# Patient Record
Sex: Female | Born: 1955
Health system: Southern US, Community
[De-identification: ages and names within clinical notes are randomized; demographics above are authoritative.]

## PROBLEM LIST (undated history)

## (undated) DIAGNOSIS — R600 Localized edema: Secondary | ICD-10-CM

## (undated) DIAGNOSIS — E039 Hypothyroidism, unspecified: Secondary | ICD-10-CM

## (undated) DIAGNOSIS — M1990 Unspecified osteoarthritis, unspecified site: Secondary | ICD-10-CM

## (undated) DIAGNOSIS — I313 Pericardial effusion (noninflammatory): Secondary | ICD-10-CM

## (undated) DIAGNOSIS — K219 Gastro-esophageal reflux disease without esophagitis: Secondary | ICD-10-CM

## (undated) DIAGNOSIS — T8859XA Other complications of anesthesia, initial encounter: Secondary | ICD-10-CM

## (undated) DIAGNOSIS — Z808 Family history of malignant neoplasm of other organs or systems: Secondary | ICD-10-CM

## (undated) DIAGNOSIS — T7840XA Allergy, unspecified, initial encounter: Secondary | ICD-10-CM

## (undated) DIAGNOSIS — R609 Edema, unspecified: Secondary | ICD-10-CM

## (undated) DIAGNOSIS — E669 Obesity, unspecified: Secondary | ICD-10-CM

## (undated) DIAGNOSIS — E785 Hyperlipidemia, unspecified: Secondary | ICD-10-CM

## (undated) DIAGNOSIS — Z8049 Family history of malignant neoplasm of other genital organs: Secondary | ICD-10-CM

## (undated) DIAGNOSIS — I341 Nonrheumatic mitral (valve) prolapse: Secondary | ICD-10-CM

## (undated) DIAGNOSIS — I10 Essential (primary) hypertension: Secondary | ICD-10-CM

## (undated) DIAGNOSIS — N6310 Unspecified lump in the right breast, unspecified quadrant: Secondary | ICD-10-CM

## (undated) DIAGNOSIS — I319 Disease of pericardium, unspecified: Secondary | ICD-10-CM

## (undated) DIAGNOSIS — Z803 Family history of malignant neoplasm of breast: Secondary | ICD-10-CM

## (undated) DIAGNOSIS — I3139 Other pericardial effusion (noninflammatory): Secondary | ICD-10-CM

## (undated) DIAGNOSIS — T4145XA Adverse effect of unspecified anesthetic, initial encounter: Secondary | ICD-10-CM

## (undated) HISTORY — DX: Gastro-esophageal reflux disease without esophagitis: K21.9

## (undated) HISTORY — DX: Family history of malignant neoplasm of other organs or systems: Z80.8

## (undated) HISTORY — DX: Other pericardial effusion (noninflammatory): I31.39

## (undated) HISTORY — PX: FRACTURE SURGERY: SHX138

## (undated) HISTORY — DX: Allergy, unspecified, initial encounter: T78.40XA

## (undated) HISTORY — PX: WRIST FRACTURE SURGERY: SHX121

## (undated) HISTORY — PX: TUBAL LIGATION: SHX77

## (undated) HISTORY — DX: Obesity, unspecified: E66.9

## (undated) HISTORY — PX: KNEE ARTHROSCOPY: SHX127

## (undated) HISTORY — DX: Family history of malignant neoplasm of breast: Z80.3

## (undated) HISTORY — DX: Family history of malignant neoplasm of other genital organs: Z80.49

## (undated) HISTORY — DX: Disease of pericardium, unspecified: I31.9

## (undated) HISTORY — DX: Pericardial effusion (noninflammatory): I31.3

## (undated) HISTORY — PX: BREAST SURGERY: SHX581

---
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
## 1992-03-23 HISTORY — PX: APPENDECTOMY: SHX54

## 1997-03-23 HISTORY — PX: BREAST BIOPSY: SHX20

## 1998-05-31 ENCOUNTER — Encounter: Payer: Self-pay | Admitting: Emergency Medicine

## 1998-05-31 ENCOUNTER — Emergency Department (HOSPITAL_COMMUNITY): Admission: EM | Admit: 1998-05-31 | Discharge: 1998-05-31 | Payer: Self-pay | Admitting: Emergency Medicine

## 1999-04-01 ENCOUNTER — Encounter: Payer: Self-pay | Admitting: Emergency Medicine

## 1999-04-01 ENCOUNTER — Encounter (INDEPENDENT_AMBULATORY_CARE_PROVIDER_SITE_OTHER): Payer: Self-pay | Admitting: *Deleted

## 1999-04-02 ENCOUNTER — Inpatient Hospital Stay (HOSPITAL_COMMUNITY): Admission: EM | Admit: 1999-04-02 | Discharge: 1999-04-02 | Payer: Self-pay | Admitting: Emergency Medicine

## 1999-06-24 ENCOUNTER — Ambulatory Visit (HOSPITAL_COMMUNITY): Admission: RE | Admit: 1999-06-24 | Discharge: 1999-06-24 | Payer: Self-pay | Admitting: *Deleted

## 1999-06-24 ENCOUNTER — Encounter: Payer: Self-pay | Admitting: Internal Medicine

## 2000-01-14 ENCOUNTER — Ambulatory Visit (HOSPITAL_COMMUNITY): Admission: RE | Admit: 2000-01-14 | Discharge: 2000-01-14 | Payer: Self-pay | Admitting: Internal Medicine

## 2000-01-14 ENCOUNTER — Encounter: Payer: Self-pay | Admitting: Internal Medicine

## 2000-07-06 ENCOUNTER — Encounter: Payer: Self-pay | Admitting: General Practice

## 2000-07-06 ENCOUNTER — Ambulatory Visit (HOSPITAL_COMMUNITY): Admission: RE | Admit: 2000-07-06 | Discharge: 2000-07-06 | Payer: Self-pay | Admitting: *Deleted

## 2001-03-28 ENCOUNTER — Emergency Department (HOSPITAL_COMMUNITY): Admission: EM | Admit: 2001-03-28 | Discharge: 2001-03-28 | Payer: Self-pay | Admitting: Emergency Medicine

## 2001-04-26 ENCOUNTER — Emergency Department (HOSPITAL_COMMUNITY): Admission: EM | Admit: 2001-04-26 | Discharge: 2001-04-26 | Payer: Self-pay | Admitting: Emergency Medicine

## 2001-04-26 ENCOUNTER — Encounter: Payer: Self-pay | Admitting: Emergency Medicine

## 2001-07-26 ENCOUNTER — Ambulatory Visit (HOSPITAL_COMMUNITY): Admission: RE | Admit: 2001-07-26 | Discharge: 2001-07-26 | Payer: Self-pay | Admitting: Internal Medicine

## 2001-07-26 ENCOUNTER — Encounter: Payer: Self-pay | Admitting: Internal Medicine

## 2002-08-08 ENCOUNTER — Ambulatory Visit (HOSPITAL_COMMUNITY): Admission: RE | Admit: 2002-08-08 | Discharge: 2002-08-08 | Payer: Self-pay | Admitting: Internal Medicine

## 2002-08-08 ENCOUNTER — Encounter: Payer: Self-pay | Admitting: Internal Medicine

## 2003-04-23 ENCOUNTER — Ambulatory Visit (HOSPITAL_COMMUNITY): Admission: RE | Admit: 2003-04-23 | Discharge: 2003-04-23 | Payer: Self-pay | Admitting: Internal Medicine

## 2003-08-22 ENCOUNTER — Ambulatory Visit (HOSPITAL_COMMUNITY): Admission: RE | Admit: 2003-08-22 | Discharge: 2003-08-22 | Payer: Self-pay | Admitting: Internal Medicine

## 2003-09-18 ENCOUNTER — Ambulatory Visit (HOSPITAL_COMMUNITY): Admission: RE | Admit: 2003-09-18 | Discharge: 2003-09-18 | Payer: Self-pay | Admitting: Internal Medicine

## 2005-02-16 ENCOUNTER — Ambulatory Visit (HOSPITAL_COMMUNITY): Admission: RE | Admit: 2005-02-16 | Discharge: 2005-02-16 | Payer: Self-pay | Admitting: Obstetrics and Gynecology

## 2006-04-05 ENCOUNTER — Ambulatory Visit (HOSPITAL_COMMUNITY): Admission: RE | Admit: 2006-04-05 | Discharge: 2006-04-05 | Payer: Self-pay | Admitting: Internal Medicine

## 2006-09-01 ENCOUNTER — Ambulatory Visit (HOSPITAL_COMMUNITY): Admission: RE | Admit: 2006-09-01 | Discharge: 2006-09-01 | Payer: Self-pay | Admitting: Internal Medicine

## 2008-02-02 ENCOUNTER — Ambulatory Visit (HOSPITAL_COMMUNITY): Admission: RE | Admit: 2008-02-02 | Discharge: 2008-02-02 | Payer: Self-pay | Admitting: Internal Medicine

## 2008-04-27 ENCOUNTER — Ambulatory Visit (HOSPITAL_COMMUNITY): Admission: RE | Admit: 2008-04-27 | Discharge: 2008-04-27 | Payer: Self-pay | Admitting: Obstetrics and Gynecology

## 2009-03-23 HISTORY — PX: MIDDLE EAR SURGERY: SHX713

## 2009-04-29 ENCOUNTER — Ambulatory Visit (HOSPITAL_COMMUNITY): Admission: RE | Admit: 2009-04-29 | Discharge: 2009-04-29 | Payer: Self-pay | Admitting: Obstetrics and Gynecology

## 2009-08-20 ENCOUNTER — Ambulatory Visit (HOSPITAL_COMMUNITY): Admission: RE | Admit: 2009-08-20 | Discharge: 2009-08-20 | Payer: Self-pay | Admitting: Internal Medicine

## 2010-01-24 ENCOUNTER — Ambulatory Visit: Payer: Self-pay | Admitting: Diagnostic Radiology

## 2010-01-24 ENCOUNTER — Emergency Department (HOSPITAL_BASED_OUTPATIENT_CLINIC_OR_DEPARTMENT_OTHER): Admission: EM | Admit: 2010-01-24 | Discharge: 2010-01-25 | Payer: Self-pay | Admitting: Emergency Medicine

## 2010-01-31 ENCOUNTER — Ambulatory Visit (HOSPITAL_BASED_OUTPATIENT_CLINIC_OR_DEPARTMENT_OTHER): Admission: RE | Admit: 2010-01-31 | Discharge: 2010-01-31 | Payer: Self-pay | Admitting: Plastic Surgery

## 2010-03-09 ENCOUNTER — Emergency Department (HOSPITAL_BASED_OUTPATIENT_CLINIC_OR_DEPARTMENT_OTHER)
Admission: EM | Admit: 2010-03-09 | Discharge: 2010-03-09 | Payer: Self-pay | Source: Home / Self Care | Admitting: Emergency Medicine

## 2010-04-12 ENCOUNTER — Encounter: Payer: Self-pay | Admitting: Internal Medicine

## 2010-04-13 ENCOUNTER — Encounter: Payer: Self-pay | Admitting: Internal Medicine

## 2010-04-13 ENCOUNTER — Encounter: Payer: Self-pay | Admitting: Obstetrics and Gynecology

## 2010-06-04 LAB — BASIC METABOLIC PANEL
CO2: 29 mEq/L (ref 19–32)
Calcium: 9.2 mg/dL (ref 8.4–10.5)
Glucose, Bld: 82 mg/dL (ref 70–99)
Sodium: 137 mEq/L (ref 135–145)

## 2010-08-08 NOTE — Op Note (Signed)
[place]. [facility]  Patient:    Stephanie Dudley, Stephanie Dudley                    MRN: [id number] Proc. Date: 04/02/99 Attending:  Sharlet Salina T. Hoxworth, M.D.                           Operative Report  PREOPERATIVE DIAGNOSIS:  Appendicitis.  POSTOPERATIVE DIAGNOSIS:  Ruptured right ovarian cyst.  OPERATION PERFORMED:  SURGEON:  Sharlet Salina T. Hoxworth, M.D.  ANESTHESIA:  General.  BRIEF HISTORY:  Stephanie Dudley is a 55 year old black female with a 36-hour history of progressively worsening right lower quadrant abdominal pain.  She has had some nausea and lack of appetite.  White count is moderately elevated with a left shift. She has point tenderness in the right lower quadrant.  She has had a gyn evaluation that included ultrasound that was reported negative.  She was felt to have likely appendicitis and laparoscopic appendectomy has been recommended and accepted. he nature of the procedure, its indications and risks of bleeding, infection, negative appendectomy were discussed and understood.  She is now brought to the operating room for this procedure.  DESCRIPTION OF PROCEDURE:  The patient was brought to the operating room and placed in supine position on the operating table and general endotracheal anesthesia induced.  Broad spectrum antibiotics had been given intravenously.  PAS were in  place.  The abdomen was sterilely prepped and draped.  Local anesthesia was used to infiltrate the trocar sites.  A 1 cm incision was made at the umbilicus and dissection was carried down to the midline fascia.  This was sharply incised for 1 cm and the peritoneum was entered under direct vision.  Through a mattress suture of 0 Vicryl, the Hasson trocar was placed and pneumoperitoneum established. The laparoscopy revealed a moderate amount of clear, greenish-yellow tinted fluid in the right gutter and pelvis.  A 5 mm trocar was placed in the right  upper quadrant. The appendix was exposed which grossly appeared normal.  The right ovary was exposed and was about a 3 cm right ovarian cyst that appeared to have an area of leakage.  This cyst was aspirated with an aspiration needle with complete resolution and the fluid appeared identical to the peritoneal fluid.  The left ovary was normal.  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  I then proceeded with appendectomy.  A 12 mm trocar was placed in the left lower quadrant. The appendix was elevated and the mesoappendix dissected away from the appendix at its base and divided with a firing of the Endo GIA vascular stapler.  One further area was divided after clipping.  The appendix was then divided at its base with a second firing of the Endo GIA 3.5 mm stapler.  The mesoappendix and appendiceal  stump were inspected and there was no evidence of bleeding and the appendiceal stump appeared intact.  The appendix was withdrawn into the 12 mm trocar and removed.  All CO2 was evacuated from the peritoneal cavity and trocars were removed under direct vision.  The pursestring suture was secured to the umbilicus.  The  skin incisions were closed with interrupted subcuticular 4-0 Vicryl and Steri-Strips.  Sponge, needle and instrument counts were correct.  A dry sterile dressing was applied.  The patient was taken to the recovery room in good condition. DD:  04/02/99 TD:  04/02/99 Job: [id number]

## 2010-11-22 ENCOUNTER — Emergency Department (HOSPITAL_BASED_OUTPATIENT_CLINIC_OR_DEPARTMENT_OTHER)
Admission: EM | Admit: 2010-11-22 | Discharge: 2010-11-22 | Disposition: A | Discharge status: Home / Self Care | Payer: Federal, State, Local not specified - PPO | Attending: Emergency Medicine | Admitting: Emergency Medicine

## 2010-11-22 ENCOUNTER — Encounter: Payer: Self-pay | Admitting: *Deleted

## 2010-11-22 DIAGNOSIS — E079 Disorder of thyroid, unspecified: Secondary | ICD-10-CM | POA: Insufficient documentation

## 2010-11-22 DIAGNOSIS — H9209 Otalgia, unspecified ear: Secondary | ICD-10-CM | POA: Insufficient documentation

## 2010-11-22 DIAGNOSIS — I10 Essential (primary) hypertension: Secondary | ICD-10-CM | POA: Insufficient documentation

## 2010-11-22 DIAGNOSIS — J309 Allergic rhinitis, unspecified: Secondary | ICD-10-CM

## 2010-11-22 HISTORY — DX: Essential (primary) hypertension: I10

## 2010-11-22 HISTORY — DX: Nonrheumatic mitral (valve) prolapse: I34.1

## 2010-11-22 MED ORDER — CETIRIZINE-PSEUDOEPHEDRINE ER 5-120 MG PO TB12: 1.0000 | ORAL_TABLET | Freq: Two times a day (BID) | ORAL | Status: DC

## 2010-11-22 NOTE — ED Notes (Signed)
Pt states she has had left ear pain and sinus problems since last Sat.

## 2010-11-22 NOTE — ED Provider Notes (Signed)
Medical screening examination/treatment/procedure(s) were performed by non-physician practitioner and as supervising physician I was immediately available for consultation/collaboration.   Douglas Delo, MD 11/22/10 2016 

## 2010-11-22 NOTE — ED Provider Notes (Signed)
History     CSN: [id number] Arrival date & time: 11/22/2010  3:35 PM  Chief Complaint  Patient presents with  . Otalgia   Patient is a 55 y.o. female presenting with ear pain. The history is provided by the patient. No language interpreter was used.  Otalgia This is a new problem. The current episode started more than 1 week ago. There is pain in the left ear. The problem occurs constantly. The problem has been gradually worsening. There has been no fever. The pain is moderate. Associated symptoms include rhinorrhea and sore throat. Pertinent negatives include no ear discharge, no headaches, no cough and no rash. Her past medical history is significant for tympanostomy tube. Her past medical history does not include chronic ear infection.    Past Medical History  Diagnosis Date  . Thyroid disease   . Hypertension   . Mitral valve prolapse     Past Surgical History  Procedure Date  . Appendectomy   . Breast surgery   . Middle ear surgery     No family history on file.  History  Substance Use Topics  . Smoking status: Never Smoker   . Smokeless tobacco: Not on file  . Alcohol Use: Yes    OB History    Grav Para Term Preterm Abortions TAB SAB Ect Mult Living                  Review of Systems  Constitutional: Negative.   HENT: Positive for ear pain, sore throat and rhinorrhea. Negative for ear discharge.   Respiratory: Negative.  Negative for cough.   Cardiovascular: Negative.   Skin: Negative for rash.  Neurological: Negative.  Negative for headaches.    Physical Exam  BP 132/82  Pulse 96  Temp(Src) 98.1 F (36.7 C) (Oral)  Resp 20  Ht 5\' 5"  (1.651 m)  Wt 170 lb (77.111 kg)  BMI 28.29 kg/m2  SpO2 98%  Physical Exam  Vitals reviewed. Constitutional: She appears well-developed and well-nourished.  HENT:  Head: Normocephalic.  Right Ear: Tympanic membrane normal.  Left Ear: Tympanic membrane is bulging.  Nose: Rhinorrhea present.  Mouth/Throat:  Posterior oropharyngeal erythema present.  Eyes: Pupils are equal, round, and reactive to light.  Cardiovascular: Normal rate and regular rhythm.   Pulmonary/Chest: Effort normal and breath sounds normal.  Neurological: She is alert.    ED Course  Procedures  MDM Symptoms consistent with allergic rhinitis:will treat symptomatically      Teressa Lower, NP 11/22/10 1550  Teressa Lower, NP 11/22/10 1550

## 2011-06-12 ENCOUNTER — Emergency Department (INDEPENDENT_AMBULATORY_CARE_PROVIDER_SITE_OTHER): Payer: Federal, State, Local not specified - PPO

## 2011-06-12 ENCOUNTER — Emergency Department (HOSPITAL_BASED_OUTPATIENT_CLINIC_OR_DEPARTMENT_OTHER)
Admission: EM | Admit: 2011-06-12 | Discharge: 2011-06-12 | Disposition: A | Discharge status: Home / Self Care | Payer: Federal, State, Local not specified - PPO | Attending: Emergency Medicine | Admitting: Emergency Medicine

## 2011-06-12 ENCOUNTER — Encounter (HOSPITAL_BASED_OUTPATIENT_CLINIC_OR_DEPARTMENT_OTHER): Payer: Self-pay

## 2011-06-12 DIAGNOSIS — Z79899 Other long term (current) drug therapy: Secondary | ICD-10-CM | POA: Insufficient documentation

## 2011-06-12 DIAGNOSIS — R05 Cough: Secondary | ICD-10-CM | POA: Insufficient documentation

## 2011-06-12 DIAGNOSIS — J40 Bronchitis, not specified as acute or chronic: Secondary | ICD-10-CM | POA: Insufficient documentation

## 2011-06-12 DIAGNOSIS — R0989 Other specified symptoms and signs involving the circulatory and respiratory systems: Secondary | ICD-10-CM | POA: Insufficient documentation

## 2011-06-12 DIAGNOSIS — I10 Essential (primary) hypertension: Secondary | ICD-10-CM | POA: Insufficient documentation

## 2011-06-12 DIAGNOSIS — J3489 Other specified disorders of nose and nasal sinuses: Secondary | ICD-10-CM | POA: Insufficient documentation

## 2011-06-12 DIAGNOSIS — R059 Cough, unspecified: Secondary | ICD-10-CM

## 2011-06-12 DIAGNOSIS — E079 Disorder of thyroid, unspecified: Secondary | ICD-10-CM | POA: Insufficient documentation

## 2011-06-12 MED ORDER — AZITHROMYCIN 250 MG PO TABS: 250.0000 mg | ORAL_TABLET | Freq: Every day | ORAL | Status: AC

## 2011-06-12 NOTE — Discharge Instructions (Signed)
Bronchitis Bronchitis is a problem of the air tubes leading to your lungs. This problem makes it hard for air to get in and out of the lungs. You may cough a lot because your air tubes are narrow. Going without care can cause lasting (chronic) bronchitis. HOME CARE   Drink enough fluids to keep your pee (urine) clear or pale yellow.   Use a cool mist humidifier.   Quit smoking if you smoke. If you keep smoking, the bronchitis might not get better.   Only take medicine as told by your doctor.  GET HELP RIGHT AWAY IF:   Coughing keeps you awake.   You start to wheeze.   You become more sick or weak.   You have a hard time breathing or get short of breath.   You cough up blood.   Coughing lasts more than 2 weeks.   You have a fever.   Your baby is older than 3 months with a rectal temperature of 102 F (38.9 C) or higher.   Your baby is 3 months old or younger with a rectal temperature of 100.4 F (38 C) or higher.  MAKE SURE YOU:  Understand these instructions.   Will watch your condition.   Will get help right away if you are not doing well or get worse.  Document Released: 08/26/2007 Document Revised: 02/26/2011 Document Reviewed: 02/08/2009 [employer] Patient Information 2012 [employer]. 

## 2011-06-12 NOTE — ED Notes (Signed)
Pt c/o sinus pain and productive cough with yellow sputum for past 2 weeks.  Pt states she has felt febrile at times.

## 2011-06-12 NOTE — ED Provider Notes (Signed)
History     CSN: [id number]  Arrival date & time 06/12/11  1524   First MD Initiated Contact with Patient 06/12/11 1537      Chief Complaint  Patient presents with  . Sinusitis  . Cough    (Consider location/radiation/quality/duration/timing/severity/associated sxs/prior treatment) HPI Comments: Pt states that she has tried otc medication without relief:pt states that she is concerned because she has continued to have a bad cough  Patient is a 56 y.o. female presenting with URI. The history is provided by the patient. No language interpreter was used.  URI The primary symptoms include cough. Primary symptoms do not include fever, sore throat, abdominal pain, nausea or vomiting. The current episode started more than 1 week ago. The problem has not changed since onset. Symptoms associated with the illness include congestion.    Past Medical History  Diagnosis Date  . Thyroid disease   . Hypertension   . Mitral valve prolapse     Past Surgical History  Procedure Date  . Appendectomy   . Breast surgery   . Middle ear surgery     No family history on file.  History  Substance Use Topics  . Smoking status: Never Smoker   . Smokeless tobacco: Not on file  . Alcohol Use: Yes    OB History    Grav Para Term Preterm Abortions TAB SAB Ect Mult Living                  Review of Systems  Constitutional: Negative for fever.  HENT: Positive for congestion. Negative for sore throat.   Eyes: Negative.   Respiratory: Positive for cough.   Cardiovascular: Negative.   Gastrointestinal: Negative for nausea, vomiting and abdominal pain.  Musculoskeletal: Negative.   Skin: Negative.   Neurological: Negative.     Allergies  Sulfa antibiotics  Home Medications   Current Outpatient Rx  Name Route Sig Dispense Refill  . CETIRIZINE-PSEUDOEPHEDRINE ER 5-120 MG PO TB12 Oral Take 1 tablet by mouth 2 (two) times daily. 20 tablet 0  . ESCITALOPRAM OXALATE 10 MG PO TABS Oral  Take 20 mg by mouth daily.      . FUROSEMIDE 20 MG PO TABS Oral Take 20 mg by mouth daily.      Marland Kitchen LEVOTHYROXINE SODIUM 75 MCG PO TABS Oral Take 75 mcg by mouth daily.      Marland Kitchen PRAVASTATIN SODIUM 20 MG PO TABS Oral Take 20 mg by mouth daily.      Marland Kitchen SPIRONOLACTONE-HCTZ 25-25 MG PO TABS Oral Take 1 tablet by mouth daily.      Marland Kitchen VALSARTAN 40 MG PO TABS Oral Take 40 mg by mouth daily.        BP 124/79  Pulse 77  Temp(Src) 98.3 F (36.8 C) (Oral)  Resp 16  Ht 5\' 6"  (1.676 m)  Wt 170 lb (77.111 kg)  BMI 27.44 kg/m2  SpO2 99%  Physical Exam  Nursing note and vitals reviewed. Constitutional: She is oriented to person, place, and time. She appears well-developed and well-nourished.  HENT:  Head: Normocephalic and atraumatic.  Right Ear: External ear normal.  Left Ear: External ear normal.  Nose: Rhinorrhea present.  Neck: Normal range of motion. Neck supple.  Cardiovascular: Normal rate and regular rhythm.   Pulmonary/Chest: Effort normal and breath sounds normal.  Musculoskeletal: Normal range of motion.  Neurological: She is alert and oriented to person, place, and time.    ED Course  Procedures (including critical care time)  Labs Reviewed - No data to display Dg Chest 2 View  06/12/2011  *RADIOLOGY REPORT*  Clinical Data: Chest congestion, cough  CHEST - 2 VIEW  Comparison: Chest x-ray of 08/20/2009  Findings: No active infiltrate or effusion is seen.  Minimal peribronchial thickening is present.  Mediastinal contours appear stable.  The heart is within upper limits of normal.  No bony abnormality is seen.  IMPRESSION: No pneumonia.  Mild peribronchial thickening.  Original Report Authenticated By: Juline Patch, M.D.     1. Bronchitis       MDM  Will treat pt with antibiotic as pt has been having symptoms for 2 weeks:no sign of pneumonia        Teressa Lower, NP 06/12/11 1629

## 2011-06-12 NOTE — ED Provider Notes (Signed)
Medical screening examination/treatment/procedure(s) were performed by non-physician practitioner and as supervising physician I was immediately available for consultation/collaboration.   Avereigh Spainhower, MD 06/12/11 2342 

## 2011-06-27 ENCOUNTER — Emergency Department (INDEPENDENT_AMBULATORY_CARE_PROVIDER_SITE_OTHER): Payer: Federal, State, Local not specified - PPO

## 2011-06-27 ENCOUNTER — Other Ambulatory Visit: Payer: Self-pay

## 2011-06-27 ENCOUNTER — Encounter (HOSPITAL_BASED_OUTPATIENT_CLINIC_OR_DEPARTMENT_OTHER): Payer: Self-pay | Admitting: Emergency Medicine

## 2011-06-27 ENCOUNTER — Emergency Department (HOSPITAL_BASED_OUTPATIENT_CLINIC_OR_DEPARTMENT_OTHER)
Admission: EM | Admit: 2011-06-27 | Discharge: 2011-06-27 | Disposition: A | Discharge status: Home / Self Care | Payer: Federal, State, Local not specified - PPO | Attending: Emergency Medicine | Admitting: Emergency Medicine

## 2011-06-27 DIAGNOSIS — I10 Essential (primary) hypertension: Secondary | ICD-10-CM | POA: Insufficient documentation

## 2011-06-27 DIAGNOSIS — R55 Syncope and collapse: Secondary | ICD-10-CM

## 2011-06-27 DIAGNOSIS — E079 Disorder of thyroid, unspecified: Secondary | ICD-10-CM | POA: Insufficient documentation

## 2011-06-27 LAB — CBC
HCT: 37.4 % (ref 36.0–46.0)
MCH: 30.8 pg (ref 26.0–34.0)
MCHC: 33.2 g/dL (ref 30.0–36.0)
MCV: 93 fL (ref 78.0–100.0)
Platelets: 293 10*3/uL (ref 150–400)
RDW: 12.7 % (ref 11.5–15.5)
WBC: 10.3 10*3/uL (ref 4.0–10.5)

## 2011-06-27 LAB — DIFFERENTIAL
Basophils Absolute: 0 10*3/uL (ref 0.0–0.1)
Eosinophils Absolute: 0.1 10*3/uL (ref 0.0–0.7)
Eosinophils Relative: 1 % (ref 0–5)
Lymphocytes Relative: 49 % — ABNORMAL HIGH (ref 12–46)
Monocytes Absolute: 0.9 10*3/uL (ref 0.1–1.0)

## 2011-06-27 LAB — COMPREHENSIVE METABOLIC PANEL
ALT: 11 U/L (ref 0–35)
AST: 21 U/L (ref 0–37)
CO2: 26 mEq/L (ref 19–32)
Calcium: 9.6 mg/dL (ref 8.4–10.5)
Creatinine, Ser: 0.8 mg/dL (ref 0.50–1.10)
GFR calc Af Amer: >90 mL/min (ref >90)
GFR calc non Af Amer: 82 mL/min — ABNORMAL LOW (ref >90)
Sodium: 143 mEq/L (ref 135–145)
Total Protein: 7.5 g/dL (ref 6.0–8.3)

## 2011-06-27 MED ORDER — SODIUM CHLORIDE 0.9 % IV SOLN
1000.0000 mL | Freq: Once | INTRAVENOUS | Status: AC
  Administered 2011-06-27: 1000 mL via INTRAVENOUS

## 2011-06-27 MED ORDER — SODIUM CHLORIDE 0.9 % IV SOLN: 1000.0000 mL | INTRAVENOUS | Status: DC

## 2011-06-27 NOTE — ED Provider Notes (Signed)
History    This chart was scribed for Hilario Quarry, MD, MD by Smitty Pluck. The patient was seen in room MH10 and the patient's care was started at 4:35PM.   CSN: [id number]  Arrival date & time 06/27/11  1615   First MD Initiated Contact with Patient 06/27/11 1633      Chief Complaint  Patient presents with  . Loss of Consciousness    (Consider location/radiation/quality/duration/timing/severity/associated sxs/prior treatment) Patient is a 56 y.o. female presenting with syncope. The history is provided by the patient.  Loss of Consciousness   Stephanie Dudley is a 56 y.o. female who presents to the Emergency Department complaining of syncope onset today. Pt fell to the ground. She denies hitting head. Pt reports sitting and watching her granddaughter foot laceration repaired then getting dizzy. She reports having blurred vision currently but no other symptoms. Pt has history of thyroid disease, HTN and mitral valve prolapse, hyperlipidemia. Pt denies hx of syncope.   Past Medical History  Diagnosis Date  . Thyroid disease   . Hypertension   . Mitral valve prolapse     Past Surgical History  Procedure Date  . Appendectomy   . Breast surgery   . Middle ear surgery     No family history on file.  History  Substance Use Topics  . Smoking status: Never Smoker   . Smokeless tobacco: Not on file  . Alcohol Use: Yes    OB History    Grav Para Term Preterm Abortions TAB SAB Ect Mult Living                  Review of Systems  Cardiovascular: Positive for syncope.  All other systems reviewed and are negative.   10 Systems reviewed and all are negative for acute change except as noted in the HPI.   Allergies  Sulfa antibiotics  Home Medications   Current Outpatient Rx  Name Route Sig Dispense Refill  . CETIRIZINE-PSEUDOEPHEDRINE ER 5-120 MG PO TB12 Oral Take 1 tablet by mouth 2 (two) times daily. 20 tablet 0  . DM-GUAIFENESIN ER 30-600 MG PO TB12 Oral Take 1  tablet by mouth every 12 (twelve) hours. Patient took this medication for congestion.    Marland Kitchen ESCITALOPRAM OXALATE 10 MG PO TABS Oral Take 20 mg by mouth daily.      . FUROSEMIDE 20 MG PO TABS Oral Take 20 mg by mouth daily.      Marland Kitchen LEVOTHYROXINE SODIUM 75 MCG PO TABS Oral Take 75 mcg by mouth daily.      Marland Kitchen PRAVASTATIN SODIUM 20 MG PO TABS Oral Take 20 mg by mouth daily.      Marland Kitchen SPIRONOLACTONE-HCTZ 25-25 MG PO TABS Oral Take 1 tablet by mouth daily.      Marland Kitchen VALSARTAN 40 MG PO TABS Oral Take 40 mg by mouth daily.        BP 146/85  Pulse 74  Temp(Src) 98 F (36.7 C) (Oral)  Resp 24  SpO2 100%  Physical Exam  Nursing note and vitals reviewed. Constitutional: She is oriented to person, place, and time. She appears well-developed and well-nourished. No distress.  HENT:  Head: Normocephalic and atraumatic.  Eyes: Conjunctivae are normal. Pupils are equal, round, and reactive to light.  Neck: Normal range of motion. No tracheal deviation present.  Cardiovascular: Normal rate, regular rhythm and normal heart sounds.   Pulmonary/Chest: Effort normal. No respiratory distress.  Neurological: She is alert and oriented to person, place, and time.  Skin: Skin is warm and dry.  Psychiatric: She has a normal mood and affect. Her behavior is normal.    ED Course  Procedures (including critical care time) DIAGNOSTIC STUDIES: Oxygen Saturation is 100% on room air, normal by my interpretation.    COORDINATION OF CARE: 4:42PM EDP discusses pt ED treatment course with pt.  4:42PM EDP ordered medication: 0.9% NaCl infusion    Labs Reviewed  DIFFERENTIAL - Abnormal; Notable for the following:    Neutrophils Relative 42 (*)    Lymphocytes Relative 49 (*)    Lymphs Abs 5.0 (*)    All other components within normal limits  COMPREHENSIVE METABOLIC PANEL - Abnormal; Notable for the following:    Potassium 3.4 (*)    GFR calc non Af Amer 82 (*)    All other components within normal limits  CBC  POCT  CBG (FASTING - GLUCOSE)-MANUAL ENTRY  TROPONIN I   Dg Chest 2 View  06/27/2011  *RADIOLOGY REPORT*  Clinical Data: Syncope  CHEST - 2 VIEW  Comparison: 06/12/11  Findings: Cardiomediastinal is stable.  No acute infiltrate or pleural effusion.  No pulmonary edema.  Bony thorax is stable. Stable central mild bronchitic changes.  IMPRESSION: No active disease.  No significant change .  Original Report Authenticated By: Natasha Mead, M.D.     No diagnosis found.   Date: 06/27/2011  Rate: 73  Rhythm: normal sinus rhythm  QRS Axis: normal  Intervals: normal  ST/T Wave abnormalities: normal  Conduction Disutrbances: none  Narrative Interpretation: unremarkable      MDM  I personally performed the services described in this documentation, which was scribed in my presence. The recorded information has been reviewed and considered.   Patient with granddaughter having laceration repaired.  NO evidence of cad on exam or work up.  Episode appears to be vasovagal syncope.     Hilario Quarry, MD 06/30/11 [phone]

## 2011-06-27 NOTE — ED Notes (Signed)
Pt was with granddaughter (granddaughter Psychologist, forensic); according to PA repairing lac,  pt became diaphoretic, confused and had a syncopal episode

## 2011-06-27 NOTE — Discharge Instructions (Signed)
Syncope  Syncope (fainting) is a sudden, short loss of consciousness. People normally fall to the ground when they faint. Recovery is often fast.  HOME CARE   Do not drive or use machines. Wait until your doctor says it is safe to do so.    If you have diabetes, check your blood sugar. If it is low (below 70), you need to drink or eat something sweet. If over 300, call your doctor.    If you have a blood pressure machine at home, take your blood pressure. If the top number is below 100 or above 170, call your doctor.    Lie down until you feel normal.    Drink extra fluids (water, juice, soup).   GET HELP RIGHT AWAY IF:     You pass out (faint) when sitting or lying down. Do not drive. Call your local emergency services (911 in [place] if no one is there to help you.    There is chest pain.    You feel sick to your stomach (nauseous) or keep throwing up (vomiting).    You have very bad belly (abdominal) pain.    You feel your heartbeat is fast or not normal.    You lose feeling in some part of the body.    You cannot move your arms or legs.    You cannot talk well and get confused.    You feel weak or cannot see well.    You get sweaty and feel lightheaded.   MAKE SURE YOU:     Understand these instructions.    Will watch your condition.    Will get help right away if you are not doing well or get worse.   Document Released: 08/26/2007 Document Revised: 02/26/2011 Document Reviewed: 08/26/2007  [facility] Patient Information 2012 [place], [place].

## 2011-07-06 LAB — GLUCOSE, CAPILLARY: Glucose-Capillary: 103 mg/dL — ABNORMAL HIGH (ref 70–99)

## 2011-07-09 ENCOUNTER — Ambulatory Visit (HOSPITAL_COMMUNITY)
Admission: RE | Admit: 2011-07-09 | Discharge: 2011-07-09 | Disposition: A | Discharge status: Home / Self Care | Payer: Federal, State, Local not specified - PPO | Source: Ambulatory Visit | Attending: Internal Medicine | Admitting: Internal Medicine

## 2011-07-09 ENCOUNTER — Encounter (INDEPENDENT_AMBULATORY_CARE_PROVIDER_SITE_OTHER): Payer: Federal, State, Local not specified - PPO

## 2011-07-09 DIAGNOSIS — R55 Syncope and collapse: Secondary | ICD-10-CM | POA: Insufficient documentation

## 2011-07-09 DIAGNOSIS — I10 Essential (primary) hypertension: Secondary | ICD-10-CM | POA: Insufficient documentation

## 2011-07-09 DIAGNOSIS — R002 Palpitations: Secondary | ICD-10-CM

## 2011-07-09 DIAGNOSIS — E785 Hyperlipidemia, unspecified: Secondary | ICD-10-CM | POA: Insufficient documentation

## 2011-07-09 NOTE — Progress Notes (Signed)
  Echocardiogram 2D Echocardiogram has been performed.  Stephanie Dudley A 07/09/2011, 10:34 AM

## 2011-11-07 ENCOUNTER — Emergency Department (HOSPITAL_BASED_OUTPATIENT_CLINIC_OR_DEPARTMENT_OTHER)
Admission: EM | Admit: 2011-11-07 | Discharge: 2011-11-07 | Disposition: A | Discharge status: Home / Self Care | Payer: Federal, State, Local not specified - PPO | Attending: Emergency Medicine | Admitting: Emergency Medicine

## 2011-11-07 ENCOUNTER — Encounter (HOSPITAL_BASED_OUTPATIENT_CLINIC_OR_DEPARTMENT_OTHER): Payer: Self-pay | Admitting: *Deleted

## 2011-11-07 DIAGNOSIS — Z79899 Other long term (current) drug therapy: Secondary | ICD-10-CM | POA: Insufficient documentation

## 2011-11-07 DIAGNOSIS — I059 Rheumatic mitral valve disease, unspecified: Secondary | ICD-10-CM | POA: Insufficient documentation

## 2011-11-07 DIAGNOSIS — Z882 Allergy status to sulfonamides status: Secondary | ICD-10-CM | POA: Insufficient documentation

## 2011-11-07 DIAGNOSIS — I10 Essential (primary) hypertension: Secondary | ICD-10-CM | POA: Insufficient documentation

## 2011-11-07 DIAGNOSIS — E079 Disorder of thyroid, unspecified: Secondary | ICD-10-CM | POA: Insufficient documentation

## 2011-11-07 DIAGNOSIS — M545 Low back pain, unspecified: Secondary | ICD-10-CM

## 2011-11-07 MED ORDER — METHOCARBAMOL 500 MG PO TABS: 500.0000 mg | ORAL_TABLET | Freq: Two times a day (BID) | ORAL | Status: AC

## 2011-11-07 MED ORDER — HYDROCODONE-ACETAMINOPHEN 5-325 MG PO TABS: 2.0000 | ORAL_TABLET | ORAL | Status: AC | PRN

## 2011-11-07 NOTE — ED Provider Notes (Signed)
History     CSN: [id number]  Arrival date & time 11/07/11  1751   First MD Initiated Contact with Patient 11/07/11 1754      Chief Complaint  Patient presents with  . Back Pain    (Consider location/radiation/quality/duration/timing/severity/associated sxs/prior treatment) HPI Comments: Patient presents today with pain across her lower back.  Pain does not radiate.  Onset of pain this morning.  She states that pain is worse with ambulation and ROM of the lower back.  She states that she has been having occasional muscle spasms of her lower back and that her back feels "stiff" and "tight."  Yesterday she helped her daughter move and did a lot of heavy lifting and twisting.  She has tried taking Ibuprofen for the pain, which provides mild relief.  Patient is a 56 y.o. female presenting with back pain. The history is provided by the patient.  Back Pain  This is a new problem. The problem occurs constantly. The problem has been gradually worsening. The symptoms are aggravated by bending and twisting. Pertinent negatives include no fever, no numbness, no abdominal pain, no bowel incontinence, no perianal numbness, no bladder incontinence, no dysuria, no leg pain, no paresthesias, no paresis, no tingling and no weakness.    Past Medical History  Diagnosis Date  . Thyroid disease   . Hypertension   . Mitral valve prolapse     Past Surgical History  Procedure Date  . Appendectomy   . Breast surgery   . Middle ear surgery     History reviewed. No pertinent family history.  History  Substance Use Topics  . Smoking status: Never Smoker   . Smokeless tobacco: Not on file  . Alcohol Use: Yes    OB History    Grav Para Term Preterm Abortions TAB SAB Ect Mult Living                  Review of Systems  Constitutional: Negative for fever and chills.  Gastrointestinal: Negative for nausea, vomiting, abdominal pain and bowel incontinence.  Genitourinary: Negative for bladder  incontinence, dysuria and decreased urine volume.       No bowel or bladder incontinence No urinary retention  Musculoskeletal: Positive for back pain. Negative for gait problem.  Skin: Negative for rash.  Neurological: Negative for tingling, weakness, numbness and paresthesias.    Allergies  Sulfa antibiotics  Home Medications   Current Outpatient Rx  Name Route Sig Dispense Refill  . DM-GUAIFENESIN ER 30-600 MG PO TB12 Oral Take 1 tablet by mouth every 12 (twelve) hours. Patient took this medication for congestion.    Marland Kitchen ESCITALOPRAM OXALATE 10 MG PO TABS Oral Take 20 mg by mouth daily.      . FUROSEMIDE 20 MG PO TABS Oral Take 20 mg by mouth daily.      Marland Kitchen LEVOTHYROXINE SODIUM 75 MCG PO TABS Oral Take 75 mcg by mouth daily.      Marland Kitchen PRAVASTATIN SODIUM 20 MG PO TABS Oral Take 20 mg by mouth daily.      Marland Kitchen SPIRONOLACTONE-HCTZ 25-25 MG PO TABS Oral Take 1 tablet by mouth daily.      Marland Kitchen VALSARTAN 40 MG PO TABS Oral Take 40 mg by mouth daily.        BP 146/97  Pulse 94  Temp 97.7 F (36.5 C) (Oral)  Resp 20  Ht 5\' 6"  (1.676 m)  Wt 180 lb (81.647 kg)  BMI 29.05 kg/m2  SpO2 98%  Physical Exam  Nursing note and vitals reviewed. Constitutional: She appears well-developed and well-nourished. No distress.  HENT:  Head: Normocephalic and atraumatic.  Neck: Normal range of motion. Neck supple.  Cardiovascular: Normal rate, regular rhythm and normal heart sounds.   Pulmonary/Chest: Effort normal and breath sounds normal.  Musculoskeletal:       Cervical back: She exhibits normal range of motion, no tenderness, no bony tenderness, no swelling, no edema and no deformity.       Thoracic back: She exhibits normal range of motion, no tenderness, no bony tenderness, no swelling, no edema and no deformity.       Lumbar back: She exhibits decreased range of motion. She exhibits no swelling, no edema and no deformity.  Neurological: She is alert. She has normal strength. No sensory deficit. Gait  normal.  Reflex Scores:      Patellar reflexes are 2+ on the right side and 2+ on the left side.      Achilles reflexes are 2+ on the right side and 2+ on the left side. Skin: Skin is warm and dry. She is not diaphoretic.  Psychiatric: She has a normal mood and affect.    ED Course  Procedures (including critical care time)  Labs Reviewed - No data to display No results found.   No diagnosis found.    MDM  Patient with back pain.  No neurological deficits and normal neuro exam.  Patient can walk but states is painful.  No loss of bowel or bladder control.  No concern for cauda equina.  No fever, night sweats, weight loss, h/o cancer, IVDU.  Onset of pain the day after helping her daughter move and lifting heavy objects.  Pain most likely muscular.  Patient discharged home with muscle relaxer and short course of pain medications.  Return precautions have been discussed with patient.          Pascal Lux [place], PA-C 11/07/11 1820

## 2011-11-07 NOTE — ED Notes (Signed)
Pt describes lower back pain onset yesterday after helping daughter move.

## 2011-11-08 NOTE — ED Provider Notes (Signed)
Medical screening examination/treatment/procedure(s) were performed by non-physician practitioner and as supervising physician I was immediately available for consultation/collaboration.  Hurman Horn, MD 11/08/11 [phone]

## 2012-04-30 ENCOUNTER — Emergency Department (HOSPITAL_BASED_OUTPATIENT_CLINIC_OR_DEPARTMENT_OTHER): Payer: Federal, State, Local not specified - PPO

## 2012-04-30 ENCOUNTER — Encounter (HOSPITAL_BASED_OUTPATIENT_CLINIC_OR_DEPARTMENT_OTHER): Payer: Self-pay | Admitting: *Deleted

## 2012-04-30 ENCOUNTER — Emergency Department (HOSPITAL_BASED_OUTPATIENT_CLINIC_OR_DEPARTMENT_OTHER)
Admission: EM | Admit: 2012-04-30 | Discharge: 2012-04-30 | Disposition: A | Discharge status: Home / Self Care | Payer: Federal, State, Local not specified - PPO | Attending: Emergency Medicine | Admitting: Emergency Medicine

## 2012-04-30 DIAGNOSIS — I10 Essential (primary) hypertension: Secondary | ICD-10-CM | POA: Insufficient documentation

## 2012-04-30 DIAGNOSIS — J069 Acute upper respiratory infection, unspecified: Secondary | ICD-10-CM | POA: Insufficient documentation

## 2012-04-30 DIAGNOSIS — Z79899 Other long term (current) drug therapy: Secondary | ICD-10-CM | POA: Insufficient documentation

## 2012-04-30 DIAGNOSIS — R059 Cough, unspecified: Secondary | ICD-10-CM

## 2012-04-30 DIAGNOSIS — R05 Cough: Secondary | ICD-10-CM

## 2012-04-30 DIAGNOSIS — R093 Abnormal sputum: Secondary | ICD-10-CM | POA: Insufficient documentation

## 2012-04-30 DIAGNOSIS — Z8679 Personal history of other diseases of the circulatory system: Secondary | ICD-10-CM | POA: Insufficient documentation

## 2012-04-30 DIAGNOSIS — J3489 Other specified disorders of nose and nasal sinuses: Secondary | ICD-10-CM | POA: Insufficient documentation

## 2012-04-30 DIAGNOSIS — J029 Acute pharyngitis, unspecified: Secondary | ICD-10-CM | POA: Insufficient documentation

## 2012-04-30 DIAGNOSIS — E079 Disorder of thyroid, unspecified: Secondary | ICD-10-CM | POA: Insufficient documentation

## 2012-04-30 MED ORDER — BENZONATATE 100 MG PO CAPS: 100.0000 mg | ORAL_CAPSULE | Freq: Three times a day (TID) | ORAL | Status: DC | PRN

## 2012-04-30 NOTE — ED Notes (Signed)
Cough x 2 weeks. Prod with green sputum. No relief with Mucinex

## 2012-04-30 NOTE — ED Provider Notes (Signed)
History     CSN: [id number]  Arrival date & time 04/30/12  1606   First MD Initiated Contact with Patient 04/30/12 1714      Chief Complaint  Patient presents with  . Cough   HPI Pt is a 57 yo F with PMH of HTN, thyroid disease and HLD presenting with 2 weeks of cough, congestion and sore throat. She states it started with nasal congestion 2 weeks ago. She used OTC medications with improvement, but then she got "sick again" with worsening congestion and cough. She states she started coughing up green sputum a few days ago. Denies fevers, chills, facial pain. 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 No past lung history. She does not smoke, but her husband does.   Past Medical History  Diagnosis Date  . Thyroid disease   . Hypertension   . Mitral valve prolapse     Past Surgical History  Procedure Laterality Date  . Appendectomy    . Breast surgery    . Middle ear surgery      History reviewed. No pertinent family history.  History  Substance Use Topics  . Smoking status: Never Smoker   . Smokeless tobacco: Not on file  . Alcohol Use: Yes    OB History   Grav Para Term Preterm Abortions TAB SAB Ect Mult Living                  Review of Systems  Constitutional: Negative for fever.  HENT: Positive for congestion, sore throat and sinus pressure. Negative for ear pain and neck pain.   Eyes: Negative for visual disturbance.  Respiratory: Positive for cough and shortness of breath. Negative for wheezing.   Cardiovascular: Negative for chest pain.  Gastrointestinal: Negative for abdominal pain.  Musculoskeletal: Negative for myalgias.  Skin: Negative for rash.  All other systems reviewed and are negative.    Allergies  Sulfa antibiotics  Home Medications   Current Outpatient Rx  Name  Route  Sig  Dispense  Refill  . benzonatate (TESSALON) 100 MG capsule   Oral   Take 1 capsule (100 mg total) by mouth 3 (three) times daily as needed for  cough.   20 capsule   0   . dextromethorphan-guaiFENesin (MUCINEX DM) 30-600 MG per 12 hr tablet   Oral   Take 1 tablet by mouth every 12 (twelve) hours. Patient took this medication for congestion.         Marland Kitchen escitalopram (LEXAPRO) 10 MG tablet   Oral   Take 20 mg by mouth daily.           . furosemide (LASIX) 20 MG tablet   Oral   Take 20 mg by mouth daily.           Marland Kitchen levothyroxine (SYNTHROID, LEVOTHROID) 75 MCG tablet   Oral   Take 75 mcg by mouth daily.           . pravastatin (PRAVACHOL) 20 MG tablet   Oral   Take 20 mg by mouth daily.           Marland Kitchen spironolactone-hydrochlorothiazide (ALDACTAZIDE) 25-25 MG per tablet   Oral   Take 1 tablet by mouth daily.           . valsartan (DIOVAN) 40 MG tablet   Oral   Take 40 mg by mouth daily.             BP 132/76  Pulse 95  Temp(Src) 98.4 F (36.9 C) (Oral)  Resp 16  Ht 5\' 6"  (1.676 m)  Wt 170 lb (77.111 kg)  BMI 27.45 kg/m2  SpO2 98%  Physical Exam  Constitutional: She is oriented to person, place, and time. She appears well-developed and well-nourished. No distress.  HENT:  Head: Normocephalic and atraumatic.  Right Ear: External ear normal.  Left Ear: External ear normal.  Mouth/Throat: Mucous membranes are normal. Posterior oropharyngeal edema and posterior oropharyngeal erythema (cobblestoning) present. No oropharyngeal exudate.  Eyes: Pupils are equal, round, and reactive to light.  Neck: Normal range of motion. Neck supple.  Cardiovascular: Normal rate and regular rhythm.   Murmur (2/6) heard. Pulmonary/Chest: Effort normal and breath sounds normal. No respiratory distress. She has no wheezes. She has no rales.  Abdominal: Soft. There is no tenderness.  Musculoskeletal: Normal range of motion. She exhibits no edema and no tenderness.  Lymphadenopathy:    She has no cervical adenopathy.  Neurological: She is alert and oriented to person, place, and time.  Skin: Skin is warm and dry. No rash  noted. She is not diaphoretic.    ED Course  Procedures (including critical care time)  Labs Reviewed - No data to display Dg Chest 2 View  04/30/2012  *RADIOLOGY REPORT*  Clinical Data: Cough  CHEST - 2 VIEW  Comparison: 06/27/2011  Findings: Mild cardiac enlargement without heart failure.  Negative for pneumonia or effusion.  Lungs are clear.  IMPRESSION: No acute cardiopulmonary abnormality and no interval change.   Original Report Authenticated By: Janeece Riggers, M.D.     1. URI (upper respiratory infection)   2. Cough     MDM  57 yo F presenting with 2 week history of URI-like symptoms now with cough.  Patient most likely with URI and cough from post-nasal drip. CXR normal and pulmonary exam unremarkable. Discussed antibiotic non-use since she is afebrile, no clear signs of sinusitis or pneumonia. This is most likely viral. Will treat symptomatically with Mucinex and tessalon. She should f/u with her PCP as needed, or return if she has problems breathing, spikes a high fever or has any new concerning symptoms.        Hilarie Fredrickson, MD 04/30/12 1745  Hilarie Fredrickson, MD 04/30/12 [id number]

## 2012-05-03 NOTE — ED Provider Notes (Signed)
I  reviewed the resident's note and I agree with the findings and plan.      Nelia Shi, MD 05/03/12 [phone]

## 2013-03-02 ENCOUNTER — Emergency Department (HOSPITAL_BASED_OUTPATIENT_CLINIC_OR_DEPARTMENT_OTHER)
Admission: EM | Admit: 2013-03-02 | Discharge: 2013-03-02 | Disposition: A | Discharge status: Home / Self Care | Payer: Federal, State, Local not specified - PPO | Attending: Emergency Medicine | Admitting: Emergency Medicine

## 2013-03-02 ENCOUNTER — Encounter (HOSPITAL_BASED_OUTPATIENT_CLINIC_OR_DEPARTMENT_OTHER): Payer: Self-pay | Admitting: Emergency Medicine

## 2013-03-02 ENCOUNTER — Emergency Department (HOSPITAL_BASED_OUTPATIENT_CLINIC_OR_DEPARTMENT_OTHER): Payer: Federal, State, Local not specified - PPO

## 2013-03-02 DIAGNOSIS — Z792 Long term (current) use of antibiotics: Secondary | ICD-10-CM | POA: Insufficient documentation

## 2013-03-02 DIAGNOSIS — W108XXA Fall (on) (from) other stairs and steps, initial encounter: Secondary | ICD-10-CM | POA: Insufficient documentation

## 2013-03-02 DIAGNOSIS — Y929 Unspecified place or not applicable: Secondary | ICD-10-CM | POA: Insufficient documentation

## 2013-03-02 DIAGNOSIS — Y9389 Activity, other specified: Secondary | ICD-10-CM | POA: Insufficient documentation

## 2013-03-02 DIAGNOSIS — I10 Essential (primary) hypertension: Secondary | ICD-10-CM | POA: Insufficient documentation

## 2013-03-02 DIAGNOSIS — S93409A Sprain of unspecified ligament of unspecified ankle, initial encounter: Secondary | ICD-10-CM | POA: Insufficient documentation

## 2013-03-02 DIAGNOSIS — Z79899 Other long term (current) drug therapy: Secondary | ICD-10-CM | POA: Insufficient documentation

## 2013-03-02 DIAGNOSIS — Z87891 Personal history of nicotine dependence: Secondary | ICD-10-CM | POA: Insufficient documentation

## 2013-03-02 DIAGNOSIS — E079 Disorder of thyroid, unspecified: Secondary | ICD-10-CM | POA: Insufficient documentation

## 2013-03-02 DIAGNOSIS — W010XXA Fall on same level from slipping, tripping and stumbling without subsequent striking against object, initial encounter: Secondary | ICD-10-CM | POA: Insufficient documentation

## 2013-03-02 MED ORDER — IBUPROFEN 800 MG PO TABS: 800.0000 mg | ORAL_TABLET | Freq: Three times a day (TID) | ORAL | Status: DC

## 2013-03-02 NOTE — ED Provider Notes (Signed)
CSN: [id number]     Arrival date & time 03/02/13  1449 History   First MD Initiated Contact with Patient 03/02/13 1512     Chief Complaint  Patient presents with  . Fall  . Ankle Pain   (Consider location/radiation/quality/duration/timing/severity/associated sxs/prior Treatment) HPI Comments: Complains of bilateral ankle injury after a fall yesterday. Patient reports that she injured both of her ankles when she fell. Initially she did not have much pain. After going to work last night, however, she says that the area swelled up and she was having trouble walking because of increased pain. There was no head injury, loss of consciousness.  Patient is a 57 y.o. female presenting with fall and ankle pain.  Fall Pertinent negatives include no headaches.  Ankle Pain   Past Medical History  Diagnosis Date  . Thyroid disease   . Hypertension   . Mitral valve prolapse    Past Surgical History  Procedure Laterality Date  . Appendectomy    . Breast surgery    . Middle ear surgery    . Wrist fracture surgery     No family history on file. History  Substance Use Topics  . Smoking status: Former Games developer  . Smokeless tobacco: Not on file  . Alcohol Use: Yes     Comment: wine 3x/wk   OB History   Grav Para Term Preterm Abortions TAB SAB Ect Mult Living                 Review of Systems  Musculoskeletal: Positive for arthralgias.  Neurological: Negative for dizziness, syncope and headaches.    Allergies  Sulfa antibiotics  Home Medications   Current Outpatient Rx  Name  Route  Sig  Dispense  Refill  . amoxicillin-clavulanate (AUGMENTIN) 500-125 MG per tablet   Oral   Take 1 tablet by mouth 2 (two) times daily.         . rosuvastatin (CRESTOR) 20 MG tablet   Oral   Take 20 mg by mouth daily.         . benzonatate (TESSALON) 100 MG capsule   Oral   Take 1 capsule (100 mg total) by mouth 3 (three) times daily as needed for cough.   20 capsule   0   .  dextromethorphan-guaiFENesin (MUCINEX DM) 30-600 MG per 12 hr tablet   Oral   Take 1 tablet by mouth every 12 (twelve) hours. Patient took this medication for congestion.         Marland Kitchen escitalopram (LEXAPRO) 10 MG tablet   Oral   Take 20 mg by mouth daily.           . furosemide (LASIX) 20 MG tablet   Oral   Take 20 mg by mouth daily.           Marland Kitchen levothyroxine (SYNTHROID, LEVOTHROID) 75 MCG tablet   Oral   Take 75 mcg by mouth daily.           . pravastatin (PRAVACHOL) 20 MG tablet   Oral   Take 20 mg by mouth daily.           Marland Kitchen spironolactone-hydrochlorothiazide (ALDACTAZIDE) 25-25 MG per tablet   Oral   Take 1 tablet by mouth daily.           . valsartan (DIOVAN) 40 MG tablet   Oral   Take 40 mg by mouth daily.            BP 126/73  Pulse 100  Temp(Src) 98.7 F (37.1 C) (Oral)  Resp 18  Ht 5\' 6"  (1.676 m)  Wt 180 lb (81.647 kg)  BMI 29.07 kg/m2  SpO2 97% Physical Exam  Constitutional: She appears well-developed and well-nourished.  HENT:  Head: Normocephalic and atraumatic.  Eyes: Pupils are equal, round, and reactive to light.  Neck: Normal range of motion.  Cardiovascular:  Pulses:      Dorsalis pedis pulses are 2+ on the right side, and 2+ on the left side.  Musculoskeletal:       Right ankle: Tenderness. Lateral malleolus tenderness found. No head of 5th metatarsal and no proximal fibula tenderness found.       Left ankle: Tenderness. Lateral malleolus and proximal fibula tenderness found. No head of 5th metatarsal tenderness found.    ED Course  Procedures (including critical care time) Labs Review Labs Reviewed - No data to display Imaging Review Dg Tibia/fibula Left  03/02/2013   CLINICAL DATA:  Status post fall 1 day ago.  Left lower leg pain.  EXAM: LEFT TIBIA AND FIBULA - 2 VIEW  COMPARISON:  None.  FINDINGS: There is no evidence of fracture or other focal bone lesions. Soft tissues are unremarkable.  IMPRESSION: Negative exam.    Electronically Signed   By: Drusilla Kanner M.D.   On: 03/02/2013 15:41   Dg Ankle Complete Left  03/02/2013   CLINICAL DATA:  Status post fall 1 day ago.  Left ankle pain.  EXAM: LEFT ANKLE COMPLETE - 3+ VIEW  COMPARISON:  None.  FINDINGS: There is no evidence of fracture, dislocation, or joint effusion. There is no evidence of arthropathy or other focal bone abnormality. Soft tissues are unremarkable.  IMPRESSION: Negative exam.   Electronically Signed   By: Drusilla Kanner M.D.   On: 03/02/2013 15:39   Dg Ankle Complete Right  03/02/2013   CLINICAL DATA:  Status post fall 1 day ago.  Right ankle pain.  EXAM: RIGHT ANKLE - COMPLETE 3+ VIEW  COMPARISON:  None.  FINDINGS: There is no evidence of fracture, dislocation, or joint effusion. There is no evidence of arthropathy or other focal bone abnormality. Soft tissues are unremarkable.  IMPRESSION: Negative exam.   Electronically Signed   By: Drusilla Kanner M.D.   On: 03/02/2013 15:40    EKG Interpretation   None       MDM  Diagnosis: Bilateral Ankle Sprain  Today we're increasing ankle pain today. Mild soft tissue swelling noted. X-ray of the bilateral ankles and the left proximal fibular region all negative.    Gilda Crease, MD 03/02/13 [phone]

## 2013-03-02 NOTE — ED Notes (Signed)
Pt "tripped over my own feet" coming down stairs yesterday, falling down last 2 stairs.  Went on to work but left leg and both ankles are hurting more today. Is able to ambulate but slowly.

## 2013-12-21 ENCOUNTER — Other Ambulatory Visit: Payer: Self-pay | Admitting: Internal Medicine

## 2013-12-21 DIAGNOSIS — Z1231 Encounter for screening mammogram for malignant neoplasm of breast: Secondary | ICD-10-CM

## 2014-01-10 ENCOUNTER — Ambulatory Visit (HOSPITAL_COMMUNITY)
Admission: RE | Admit: 2014-01-10 | Discharge: 2014-01-10 | Disposition: A | Discharge status: Home / Self Care | Payer: Federal, State, Local not specified - PPO | Source: Ambulatory Visit | Attending: Internal Medicine | Admitting: Internal Medicine

## 2014-01-10 DIAGNOSIS — Z1231 Encounter for screening mammogram for malignant neoplasm of breast: Secondary | ICD-10-CM | POA: Insufficient documentation

## 2015-06-28 ENCOUNTER — Encounter (HOSPITAL_BASED_OUTPATIENT_CLINIC_OR_DEPARTMENT_OTHER): Payer: Self-pay | Admitting: *Deleted

## 2015-06-28 ENCOUNTER — Emergency Department (HOSPITAL_BASED_OUTPATIENT_CLINIC_OR_DEPARTMENT_OTHER)
Admission: EM | Admit: 2015-06-28 | Discharge: 2015-06-29 | Disposition: A | Discharge status: Home / Self Care | Payer: Federal, State, Local not specified - PPO | Attending: Emergency Medicine | Admitting: Emergency Medicine

## 2015-06-28 DIAGNOSIS — I10 Essential (primary) hypertension: Secondary | ICD-10-CM | POA: Insufficient documentation

## 2015-06-28 DIAGNOSIS — Z79899 Other long term (current) drug therapy: Secondary | ICD-10-CM | POA: Insufficient documentation

## 2015-06-28 DIAGNOSIS — L03011 Cellulitis of right finger: Secondary | ICD-10-CM | POA: Insufficient documentation

## 2015-06-28 DIAGNOSIS — Z87891 Personal history of nicotine dependence: Secondary | ICD-10-CM | POA: Insufficient documentation

## 2015-06-28 DIAGNOSIS — IMO0001 Reserved for inherently not codable concepts without codable children: Secondary | ICD-10-CM

## 2015-06-28 DIAGNOSIS — M79641 Pain in right hand: Secondary | ICD-10-CM | POA: Diagnosis present

## 2015-06-28 MED ORDER — BUPIVACAINE HCL 0.5 % IJ SOLN
50.0000 mL | Freq: Once | INTRAMUSCULAR | Status: AC
  Administered 2015-06-29: 50 mL
  Filled 2015-06-28: qty 1

## 2015-06-28 NOTE — ED Provider Notes (Signed)
CSN: [id number]     Arrival date & time 06/28/15  2133 History  By signing my name below, I, Hansel Feinstein, attest that this documentation has been prepared under the direction and in the presence of Shanon Rosser, MD. Electronically Signed: Hansel Feinstein, ED Scribe. 06/28/2015. 11:25 PM.    Chief Complaint  Patient presents with  . Hand Pain   The history is provided by the patient. No language interpreter was used.   HPI Comments: Stephanie Dudley is a 60 y.o. female who presents to the Emergency Department complaining of a gradually worsening area of pain and swelling adjacent to the right middle fingernail bed onset 6 days ago. Pain is moderate. Pt denies any nail biting, known injury or trauma to the finger. She describes her pain as throbbing and notes that it is worsened with palpation. Pt denies taking OTC medications at home to improve symptoms. She also denies fever, chills, nausea, vomiting or diarrhea.     Past Medical History  Diagnosis Date  . Thyroid disease   . Hypertension   . Mitral valve prolapse    Past Surgical History  Procedure Laterality Date  . Appendectomy    . Breast surgery    . Middle ear surgery    . Wrist fracture surgery     No family history on file. Social History  Substance Use Topics  . Smoking status: Former Research scientist (life sciences)  . Smokeless tobacco: None  . Alcohol Use: Yes     Comment: wine 3x/wk   OB History    No data available     Review of Systems A complete 10 system review of systems was obtained and all systems are negative except as noted in the HPI and PMH.    Allergies  Sulfa antibiotics  Home Medications   Prior to Admission medications   Medication Sig Start Date End Date Taking? Authorizing Provider  amoxicillin-clavulanate (AUGMENTIN) 500-125 MG per tablet Take 1 tablet by mouth 2 (two) times daily.    Historical Provider, MD  benzonatate (TESSALON) 100 MG capsule Take 1 capsule (100 mg total) by mouth 3 (three) times daily as needed  for cough. 04/30/12   Amber Fidel Levy, MD  dextromethorphan-guaiFENesin (MUCINEX DM) 30-600 MG per 12 hr tablet Take 1 tablet by mouth every 12 (twelve) hours. Patient took this medication for congestion.    Historical Provider, MD  escitalopram (LEXAPRO) 10 MG tablet Take 20 mg by mouth daily.      Historical Provider, MD  furosemide (LASIX) 20 MG tablet Take 20 mg by mouth daily.      Historical Provider, MD  ibuprofen (ADVIL,MOTRIN) 800 MG tablet Take 1 tablet (800 mg total) by mouth 3 (three) times daily. 03/02/13   Orpah Greek, MD  levothyroxine (SYNTHROID, LEVOTHROID) 75 MCG tablet Take 75 mcg by mouth daily.      Historical Provider, MD  pravastatin (PRAVACHOL) 20 MG tablet Take 20 mg by mouth daily.      Historical Provider, MD  rosuvastatin (CRESTOR) 20 MG tablet Take 20 mg by mouth daily.    Historical Provider, MD  spironolactone-hydrochlorothiazide (ALDACTAZIDE) 25-25 MG per tablet Take 1 tablet by mouth daily.      Historical Provider, MD  valsartan (DIOVAN) 40 MG tablet Take 40 mg by mouth daily.      Historical Provider, MD   BP 144/96 mmHg  Pulse 85  Temp(Src) 97.9 F (36.6 C) (Oral)  Resp 18  Ht 5\' 6"  (1.676 m)  Wt 150  lb (68.04 kg)  BMI 24.22 kg/m2  SpO2 98%   Physical Exam General: Well-developed, well-nourished female in no acute distress; appearance consistent with age of record HENT: normocephalic; atraumatic; oropharynx clear and moist; normal TMs bilaterally  Eyes: pupils equal, round and reactive to light; extraocular muscles intact Neck: supple Heart: regular rate and rhythm Lungs: clear to auscultation bilaterally Abdomen: soft; nondistended; nontender; bowel sounds present Extremities: No deformity; full range of motion; tenderness and swelling of the distal phalanx of right middle finger on the thenar side of the nail Neurologic: Awake, alert and oriented; motor function intact in all extremities and symmetric; no facial droop Skin: Warm and  dry Psychiatric: Normal mood and affect   ED Course  Procedures (including critical care time) DIAGNOSTIC STUDIES: Oxygen Saturation is 98% on RA, normal by my interpretation.    COORDINATION OF CARE: 11:23 PM Discussed treatment plan with pt at bedside which includes I&D and pt agreed to plan.  INCISION AND DRAINAGE Performed by: Shanon Rosser L Consent: Verbal consent obtained. Risks and benefits: risks, benefits and alternatives were discussed Type: abscess  Body area: Right middle finger  Anesthesia: Digital block  Incision was made with a scalpel.  Local anesthetic: Bupivacaine 0.5% without epinephrine  Anesthetic total: 3 ml  Complexity: complex Sharp dissection to break up loculations  Drainage: purulent  Drainage amount: Moderate   Packing material: None   Patient tolerance: Patient tolerated the procedure well with no immediate complications.    MDM   Final diagnoses:  Paronychia of third finger of right hand   I personally performed the services described in this documentation, which was scribed in my presence. The recorded information has been reviewed and is accurate.   Shanon Rosser, MD 06/29/15 [phone]

## 2015-06-28 NOTE — ED Notes (Signed)
Swelling and pain to her right middle finger. Pus under her nailbed.

## 2015-06-29 MED ORDER — HYDROCODONE-ACETAMINOPHEN 5-325 MG PO TABS: 1.0000 | ORAL_TABLET | Freq: Four times a day (QID) | ORAL | Status: DC | PRN

## 2015-06-29 NOTE — Discharge Instructions (Signed)
Paronychia °Paronychia is an infection of the skin that surrounds a nail. It usually affects the skin around a fingernail, but it may also occur near a toenail. It often causes pain and swelling around the nail. This condition may come on suddenly or develop over a longer period. In some cases, a collection of pus (abscess) can form near or under the nail. Usually, paronychia is not serious and it clears up with treatment. °CAUSES °This condition may be caused by bacteria or fungi. It is commonly caused by either Streptococcus or Staphylococcus bacteria. The bacteria or fungi often cause the infection by getting into the affected area through an opening in the skin, such as a cut or a hangnail. °RISK FACTORS °This condition is more likely to develop in: °· People who get their hands wet often, such as those who work as dishwashers, bartenders, or nurses. °· People who bite their fingernails or suck their thumbs. °· People who trim their nails too short. °· People who have hangnails or injured fingertips. °· People who get manicures. °· People who have diabetes. °SYMPTOMS °Symptoms of this condition include: °· Redness and swelling of the skin near the nail. °· Tenderness around the nail when you touch the area. °· Pus-filled bumps under the cuticle. The cuticle is the skin at the base or sides of the nail. °· Fluid or pus under the nail. °· Throbbing pain in the area. °DIAGNOSIS °This condition is usually diagnosed with a physical exam. In some cases, a sample of pus may be taken from an abscess to be tested in a lab. This can help to determine what type of bacteria or fungi is causing the condition. °TREATMENT °Treatment for this condition depends on the cause and severity of the condition. If the condition is mild, it may clear up on its own in a few days. Your health care provider may recommend soaking the affected area in warm water a few times a day. When treatment is needed, the options may  include: °· Antibiotic medicine, if the condition is caused by a bacterial infection. °· Antifungal medicine, if the condition is caused by a fungal infection. °· Incision and drainage, if an abscess is present. In this procedure, the health care provider will cut open the abscess so the pus can drain out. °HOME CARE INSTRUCTIONS °· Soak the affected area in warm water if directed to do so by your health care provider. You may be told to do this for 20 minutes, 2-3 times a day. Keep the area dry in between soakings. °· Take medicines only as directed by your health care provider. °· If you were prescribed an antibiotic medicine, finish all of it even if you start to feel better. °· Keep the affected area clean. °· Do not try to drain a fluid-filled bump yourself. °· If you will be washing dishes or performing other tasks that require your hands to get wet, wear rubber gloves. You should also wear gloves if your hands might come in contact with irritating substances, such as cleaners or chemicals. °· Follow your health care provider's instructions about: °¨ Wound care. °¨ Bandage (dressing) changes and removal. °SEEK MEDICAL CARE IF: °· Your symptoms get worse or do not improve with treatment. °· You have a fever or chills. °· You have redness spreading from the affected area. °· You have continued or increased fluid, blood, or pus coming from the affected area. °· Your finger or knuckle becomes swollen or is difficult to move. °  °  This information is not intended to replace advice given to you by your health care provider. Make sure you discuss any questions you have with your health care provider. °  °Document Released: 09/02/2000 Document Revised: 07/24/2014 Document Reviewed: 02/14/2014 °[employer]vier Interactive Patient Education ©2016 [employer] ° °

## 2015-06-29 NOTE — ED Notes (Signed)
Wound care completed.

## 2015-07-03 DIAGNOSIS — M25571 Pain in right ankle and joints of right foot: Secondary | ICD-10-CM | POA: Diagnosis not present

## 2015-07-03 DIAGNOSIS — E785 Hyperlipidemia, unspecified: Secondary | ICD-10-CM | POA: Diagnosis not present

## 2015-07-03 DIAGNOSIS — I10 Essential (primary) hypertension: Secondary | ICD-10-CM | POA: Diagnosis not present

## 2015-07-03 DIAGNOSIS — M7989 Other specified soft tissue disorders: Secondary | ICD-10-CM | POA: Diagnosis not present

## 2015-07-03 DIAGNOSIS — S99911A Unspecified injury of right ankle, initial encounter: Secondary | ICD-10-CM | POA: Diagnosis not present

## 2015-07-03 DIAGNOSIS — E039 Hypothyroidism, unspecified: Secondary | ICD-10-CM | POA: Diagnosis not present

## 2015-07-12 DIAGNOSIS — M25562 Pain in left knee: Secondary | ICD-10-CM | POA: Diagnosis not present

## 2015-07-12 DIAGNOSIS — S93401A Sprain of unspecified ligament of right ankle, initial encounter: Secondary | ICD-10-CM | POA: Diagnosis not present

## 2015-07-26 DIAGNOSIS — M25571 Pain in right ankle and joints of right foot: Secondary | ICD-10-CM | POA: Diagnosis not present

## 2015-11-01 DIAGNOSIS — M25561 Pain in right knee: Secondary | ICD-10-CM | POA: Diagnosis not present

## 2015-11-29 DIAGNOSIS — M25561 Pain in right knee: Secondary | ICD-10-CM | POA: Diagnosis not present

## 2015-12-02 DIAGNOSIS — R1012 Left upper quadrant pain: Secondary | ICD-10-CM | POA: Diagnosis not present

## 2015-12-02 DIAGNOSIS — M2550 Pain in unspecified joint: Secondary | ICD-10-CM | POA: Diagnosis not present

## 2015-12-02 DIAGNOSIS — E039 Hypothyroidism, unspecified: Secondary | ICD-10-CM | POA: Diagnosis not present

## 2015-12-02 DIAGNOSIS — E781 Pure hyperglyceridemia: Secondary | ICD-10-CM | POA: Diagnosis not present

## 2015-12-02 DIAGNOSIS — R7302 Impaired glucose tolerance (oral): Secondary | ICD-10-CM | POA: Diagnosis not present

## 2015-12-02 DIAGNOSIS — I10 Essential (primary) hypertension: Secondary | ICD-10-CM | POA: Diagnosis not present

## 2015-12-02 DIAGNOSIS — J029 Acute pharyngitis, unspecified: Secondary | ICD-10-CM | POA: Diagnosis not present

## 2016-01-13 DIAGNOSIS — N644 Mastodynia: Secondary | ICD-10-CM | POA: Diagnosis not present

## 2016-01-23 DIAGNOSIS — R928 Other abnormal and inconclusive findings on diagnostic imaging of breast: Secondary | ICD-10-CM | POA: Diagnosis not present

## 2016-01-23 DIAGNOSIS — N6310 Unspecified lump in the right breast, unspecified quadrant: Secondary | ICD-10-CM | POA: Diagnosis not present

## 2016-03-17 ENCOUNTER — Emergency Department (HOSPITAL_BASED_OUTPATIENT_CLINIC_OR_DEPARTMENT_OTHER): Payer: Federal, State, Local not specified - PPO

## 2016-03-17 ENCOUNTER — Encounter (HOSPITAL_BASED_OUTPATIENT_CLINIC_OR_DEPARTMENT_OTHER): Payer: Self-pay

## 2016-03-17 ENCOUNTER — Emergency Department (HOSPITAL_BASED_OUTPATIENT_CLINIC_OR_DEPARTMENT_OTHER)
Admission: EM | Admit: 2016-03-17 | Discharge: 2016-03-17 | Disposition: A | Discharge status: Home / Self Care | Payer: Federal, State, Local not specified - PPO | Attending: Emergency Medicine | Admitting: Emergency Medicine

## 2016-03-17 DIAGNOSIS — Z87891 Personal history of nicotine dependence: Secondary | ICD-10-CM | POA: Insufficient documentation

## 2016-03-17 DIAGNOSIS — R059 Cough, unspecified: Secondary | ICD-10-CM

## 2016-03-17 DIAGNOSIS — Z79899 Other long term (current) drug therapy: Secondary | ICD-10-CM | POA: Diagnosis not present

## 2016-03-17 DIAGNOSIS — I10 Essential (primary) hypertension: Secondary | ICD-10-CM | POA: Diagnosis not present

## 2016-03-17 DIAGNOSIS — J209 Acute bronchitis, unspecified: Secondary | ICD-10-CM | POA: Insufficient documentation

## 2016-03-17 DIAGNOSIS — R05 Cough: Secondary | ICD-10-CM | POA: Diagnosis not present

## 2016-03-17 HISTORY — DX: Hyperlipidemia, unspecified: E78.5

## 2016-03-17 MED ORDER — BENZONATATE 100 MG PO CAPS: 200.0000 mg | ORAL_CAPSULE | Freq: Two times a day (BID) | ORAL | 0 refills | Status: DC | PRN

## 2016-03-17 MED ORDER — PREDNISONE 20 MG PO TABS: 40.0000 mg | ORAL_TABLET | Freq: Every day | ORAL | 0 refills | Status: DC

## 2016-03-17 MED ORDER — AEROCHAMBER PLUS W/MASK MISC
1.0000 | Freq: Once | Status: AC
  Administered 2016-03-17: 1
  Filled 2016-03-17: qty 1

## 2016-03-17 MED ORDER — ALBUTEROL SULFATE HFA 108 (90 BASE) MCG/ACT IN AERS
2.0000 | INHALATION_SPRAY | Freq: Once | RESPIRATORY_TRACT | Status: AC
  Administered 2016-03-17: 2 via RESPIRATORY_TRACT
  Filled 2016-03-17: qty 6.7

## 2016-03-17 MED FILL — BENZONATATE 100 MG CAPSULE: 100 | 5 days supply | Qty: 20 | Fill #0

## 2016-03-17 MED FILL — predniSONE 20 MG TABS: 20 | 5 days supply | Qty: 10 | Fill #0

## 2016-03-17 NOTE — ED Triage Notes (Signed)
Pt reports cough since last week - states at times it is productive with yellow sputum - associated chills, fever, body aches, fatigue, exertional shortness of breath, and audible wheezing at night.

## 2016-03-17 NOTE — ED Provider Notes (Signed)
[facility]PT [facility] Provider Note   CSN: [id number] Arrival date & time: 03/17/16  1002     History   Chief Complaint Chief Complaint  Patient presents with  . Cough    HPI Stephanie Dudley is a 60 y.o. female.He presents emergency Department with chief complaint of cough. She states has been ongoing for about the past 5 days. She has pain with coughing, myalgias. She denies any fevers. She's had generalized malaise. Her cough is productive of clear sputum. She states that she has been trying over-the-counter cough medicines with multiple only moderate relief of her symptoms. She has wheezing and coughing is worse at night and has been keeping her awake. She has a history of chronic bronchitis. She does not smoke cigarettes. She denies a history of asthma but states that she has had to use an inhaler in the past.  HPI  Past Medical History:  Diagnosis Date  . Hyperlipidemia   . Hypertension   . Mitral valve prolapse   . Thyroid disease     There are no active problems to display for this patient.   Past Surgical History:  Procedure Laterality Date  . APPENDECTOMY    . BREAST SURGERY    . MIDDLE EAR SURGERY    . TONSILLECTOMY    . WRIST FRACTURE SURGERY      OB History    No data available       Home Medications    Prior to Admission medications   Medication Sig Start Date End Date Taking? Authorizing Provider  escitalopram (LEXAPRO) 10 MG tablet Take 20 mg by mouth daily.     Yes Historical Provider, MD  furosemide (LASIX) 20 MG tablet Take 40 mg by mouth daily.    Yes Historical Provider, MD  levothyroxine (SYNTHROID, LEVOTHROID) 75 MCG tablet Take 88 mcg by mouth daily.    Yes Historical Provider, MD  rosuvastatin (CRESTOR) 20 MG tablet Take 20 mg by mouth daily.   Yes Historical Provider, MD  spironolactone-hydrochlorothiazide (ALDACTAZIDE) 25-25 MG per tablet Take 1 tablet by mouth daily.     Yes Historical Provider, MD  valsartan (DIOVAN) 40 MG  tablet Take 160 mg by mouth daily.    Yes Historical Provider, MD  benzonatate (TESSALON) 100 MG capsule Take 2 capsules (200 mg total) by mouth 2 (two) times daily as needed for cough. 03/17/16   Margarita Mail, PA-C  HYDROcodone-acetaminophen (NORCO) 5-325 MG tablet Take 1-2 tablets by mouth every 6 (six) hours as needed (for pain). 06/29/15   John Molpus, MD  pravastatin (PRAVACHOL) 20 MG tablet Take 20 mg by mouth daily.      Historical Provider, MD  predniSONE (DELTASONE) 20 MG tablet Take 2 tablets (40 mg total) by mouth daily. 03/17/16   Margarita Mail, PA-C    Family History History reviewed. No pertinent family history.  Social History Social History  Substance Use Topics  . Smoking status: Former Research scientist (life sciences)  . Smokeless tobacco: Never Used  . Alcohol use Yes     Comment: wine 3x/wk     Allergies   Sulfa antibiotics   Review of Systems Review of Systems  Ten systems reviewed and are negative for acute change, except as noted in the HPI.   Physical Exam Updated Vital Signs BP 113/79 (BP Location: Left Arm)   Pulse 87   Temp 98.6 F (37 C) (Oral)   Resp 18   Ht 5\' 3"  (1.6 m)   Wt 74.8 kg   SpO2  98%   BMI 29.23 kg/m   Physical Exam  Constitutional: She is oriented to person, place, and time. She appears well-developed and well-nourished. No distress.  HENT:  Head: Normocephalic and atraumatic.  Eyes: Conjunctivae are normal. No scleral icterus.  Neck: Normal range of motion.  Cardiovascular: Normal rate, regular rhythm and normal heart sounds.  Exam reveals no gallop and no friction rub.   No murmur heard. Pulmonary/Chest: Effort normal. No respiratory distress. She has wheezes.  Minimal  expiratory wheezing  Abdominal: Soft. Bowel sounds are normal. She exhibits no distension and no mass. There is no tenderness. There is no guarding.  Neurological: She is alert and oriented to person, place, and time.  Skin: Skin is warm and dry. She is not diaphoretic.      ED Treatments / Results  Labs (all labs ordered are listed, but only abnormal results are displayed) Labs Reviewed - No data to display  EKG  EKG Interpretation None       Radiology Dg Chest 2 View  Result Date: 03/17/2016 CLINICAL DATA:  Cough. EXAM: CHEST  2 VIEW COMPARISON:  04/30/2012. FINDINGS: Mediastinum and hilar structures normal. Lungs are clear. No pleural effusion or pneumothorax. Heart size normal. No acute bony abnormality. IMPRESSION: No acute cardiopulmonary disease. Electronically Signed   By: [name]  Register   On: 03/17/2016 10:38    Procedures Procedures (including critical care time)  Medications Ordered in ED Medications  albuterol (PROVENTIL HFA;VENTOLIN HFA) 108 (90 Base) MCG/ACT inhaler 2 puff (2 puffs Inhalation Given 03/17/16 1104)  aerochamber plus with mask device 1 each (1 each Other Given 03/17/16 1104)     Initial Impression / Assessment and Plan / ED Course  I have reviewed the triage vital signs and the nursing notes.  Pertinent labs & imaging results that were available during my care of the patient were reviewed by me and considered in my medical decision making (see chart for details).  Clinical Course     Patient with negative chest x-ray. Hemodynamically stable and afebrile here in the emergency department. The patient's symptoms represent a viral induced bronchospasm. She'll be given an albuterol inhaler, prednisone and Tessalon. Patient is follow-up with her primary care physician in the next 2 days for recheck. Discussed return precautions and the patient. Her safe for discharge at this time  Final Clinical Impressions(s) / ED Diagnoses   Final diagnoses:  Cough  Bronchospasm with bronchitis, acute    New Prescriptions New Prescriptions   BENZONATATE (TESSALON) 100 MG CAPSULE    Take 2 capsules (200 mg total) by mouth 2 (two) times daily as needed for cough.   PREDNISONE (DELTASONE) 20 MG TABLET    Take 2 tablets (40  mg total) by mouth daily.     Margarita Mail, PA-C 03/17/16 Oto, MD 03/17/16 [id number]

## 2016-03-17 NOTE — Discharge Instructions (Signed)
Contact a health care provider if:  You have muscle aches.  You have chest pain.  The sputum changes from clear or white to yellow, green, gray, or bloody.  The sputum you cough up gets thicker.  There are problems that may be related to the medicine you are given, such as a rash, itching, swelling, or trouble breathing. Get help right away if:  You have worsening wheezing and coughing even after taking your prescribed medicines.  You have increased difficulty breathing.  You develop severe chest pain.

## 2016-03-25 ENCOUNTER — Other Ambulatory Visit: Payer: Self-pay | Admitting: Internal Medicine

## 2016-03-25 DIAGNOSIS — R1012 Left upper quadrant pain: Secondary | ICD-10-CM | POA: Diagnosis not present

## 2016-03-25 DIAGNOSIS — R1011 Right upper quadrant pain: Secondary | ICD-10-CM

## 2016-03-25 DIAGNOSIS — I10 Essential (primary) hypertension: Secondary | ICD-10-CM | POA: Diagnosis not present

## 2016-03-25 DIAGNOSIS — E039 Hypothyroidism, unspecified: Secondary | ICD-10-CM | POA: Diagnosis not present

## 2016-03-25 DIAGNOSIS — E781 Pure hyperglyceridemia: Secondary | ICD-10-CM | POA: Diagnosis not present

## 2016-04-01 ENCOUNTER — Ambulatory Visit (HOSPITAL_COMMUNITY)
Admission: RE | Admit: 2016-04-01 | Discharge: 2016-04-01 | Disposition: A | Discharge status: Home / Self Care | Payer: Federal, State, Local not specified - PPO | Source: Ambulatory Visit | Attending: Internal Medicine | Admitting: Internal Medicine

## 2016-04-01 DIAGNOSIS — R1011 Right upper quadrant pain: Secondary | ICD-10-CM | POA: Diagnosis not present

## 2016-06-22 DIAGNOSIS — I10 Essential (primary) hypertension: Secondary | ICD-10-CM | POA: Diagnosis not present

## 2016-06-22 DIAGNOSIS — E039 Hypothyroidism, unspecified: Secondary | ICD-10-CM | POA: Diagnosis not present

## 2016-06-22 DIAGNOSIS — E781 Pure hyperglyceridemia: Secondary | ICD-10-CM | POA: Diagnosis not present

## 2016-06-22 DIAGNOSIS — R7302 Impaired glucose tolerance (oral): Secondary | ICD-10-CM | POA: Diagnosis not present

## 2016-06-22 DIAGNOSIS — E559 Vitamin D deficiency, unspecified: Secondary | ICD-10-CM | POA: Diagnosis not present

## 2016-06-22 DIAGNOSIS — M2550 Pain in unspecified joint: Secondary | ICD-10-CM | POA: Diagnosis not present

## 2016-07-13 DIAGNOSIS — I10 Essential (primary) hypertension: Secondary | ICD-10-CM | POA: Diagnosis not present

## 2016-07-13 DIAGNOSIS — E781 Pure hyperglyceridemia: Secondary | ICD-10-CM | POA: Diagnosis not present

## 2016-07-13 DIAGNOSIS — E039 Hypothyroidism, unspecified: Secondary | ICD-10-CM | POA: Diagnosis not present

## 2016-08-31 DIAGNOSIS — E039 Hypothyroidism, unspecified: Secondary | ICD-10-CM | POA: Diagnosis not present

## 2016-08-31 DIAGNOSIS — E7800 Pure hypercholesterolemia, unspecified: Secondary | ICD-10-CM | POA: Diagnosis not present

## 2016-08-31 DIAGNOSIS — I10 Essential (primary) hypertension: Secondary | ICD-10-CM | POA: Diagnosis not present

## 2016-09-09 DIAGNOSIS — M25562 Pain in left knee: Secondary | ICD-10-CM | POA: Diagnosis not present

## 2016-10-07 DIAGNOSIS — M25562 Pain in left knee: Secondary | ICD-10-CM | POA: Diagnosis not present

## 2016-10-12 DIAGNOSIS — M25562 Pain in left knee: Secondary | ICD-10-CM | POA: Diagnosis not present

## 2016-10-14 DIAGNOSIS — M25562 Pain in left knee: Secondary | ICD-10-CM | POA: Diagnosis not present

## 2016-10-19 ENCOUNTER — Encounter (HOSPITAL_BASED_OUTPATIENT_CLINIC_OR_DEPARTMENT_OTHER): Payer: Self-pay | Admitting: *Deleted

## 2016-10-19 ENCOUNTER — Emergency Department (HOSPITAL_BASED_OUTPATIENT_CLINIC_OR_DEPARTMENT_OTHER)
Admission: EM | Admit: 2016-10-19 | Discharge: 2016-10-19 | Disposition: A | Discharge status: Home / Self Care | Payer: Federal, State, Local not specified - PPO | Attending: Emergency Medicine | Admitting: Emergency Medicine

## 2016-10-19 ENCOUNTER — Emergency Department (HOSPITAL_BASED_OUTPATIENT_CLINIC_OR_DEPARTMENT_OTHER): Payer: Federal, State, Local not specified - PPO

## 2016-10-19 DIAGNOSIS — M7989 Other specified soft tissue disorders: Secondary | ICD-10-CM | POA: Diagnosis not present

## 2016-10-19 DIAGNOSIS — S66811A Strain of other specified muscles, fascia and tendons at wrist and hand level, right hand, initial encounter: Secondary | ICD-10-CM | POA: Insufficient documentation

## 2016-10-19 DIAGNOSIS — S63652A Sprain of metacarpophalangeal joint of right middle finger, initial encounter: Secondary | ICD-10-CM

## 2016-10-19 DIAGNOSIS — Y939 Activity, unspecified: Secondary | ICD-10-CM | POA: Diagnosis not present

## 2016-10-19 DIAGNOSIS — Y929 Unspecified place or not applicable: Secondary | ICD-10-CM | POA: Insufficient documentation

## 2016-10-19 DIAGNOSIS — W2209XA Striking against other stationary object, initial encounter: Secondary | ICD-10-CM | POA: Insufficient documentation

## 2016-10-19 DIAGNOSIS — Y999 Unspecified external cause status: Secondary | ICD-10-CM | POA: Diagnosis not present

## 2016-10-19 DIAGNOSIS — S63650A Sprain of metacarpophalangeal joint of right index finger, initial encounter: Secondary | ICD-10-CM | POA: Diagnosis not present

## 2016-10-19 DIAGNOSIS — S66911A Strain of unspecified muscle, fascia and tendon at wrist and hand level, right hand, initial encounter: Secondary | ICD-10-CM

## 2016-10-19 DIAGNOSIS — S6991XA Unspecified injury of right wrist, hand and finger(s), initial encounter: Secondary | ICD-10-CM | POA: Diagnosis present

## 2016-10-19 DIAGNOSIS — S6391XA Sprain of unspecified part of right wrist and hand, initial encounter: Secondary | ICD-10-CM | POA: Diagnosis not present

## 2016-10-19 NOTE — ED Notes (Signed)
Pt ambulatory at d/c without need for assist. Splint assessed by Dr Leonette Monarch prior to d/c pt home

## 2016-10-19 NOTE — ED Triage Notes (Signed)
Injury to her right hand. 2 days ago while taking a swim lesson her hand hit the bottom of the pool. Swelling.

## 2016-10-19 NOTE — ED Provider Notes (Signed)
[facility] DE[facility] Provider Note   CSN: [id number] Arrival date & time: 10/19/16  1310     History   Chief Complaint Chief Complaint  Patient presents with  . Fall  . Hand Injury    HPI Stephanie Dudley is a 61 y.o. female.  HPI  61 year old female who presents with 2 days of right hand pain. Patient reports that she injured her hand while swimming, hitting her hand on the bottom of the pool. He reports that the pain did not start initially however started feeling pain and noticing swelling the following evening. Pain is exacerbated with palpation at the dorsum of the hand and movement of the middle and index finger. Pain is alleviated with immobilization. No other alleviating or aggravating factors. Patient denies any other trauma or physical complaints at this time.   Past Medical History:  Diagnosis Date  . Hyperlipidemia   . Hypertension   . Mitral valve prolapse   . Thyroid disease     There are no active problems to display for this patient.   Past Surgical History:  Procedure Laterality Date  . APPENDECTOMY    . BREAST SURGERY    . MIDDLE EAR SURGERY    . TONSILLECTOMY    . WRIST FRACTURE SURGERY      OB History    No data available       Home Medications    Prior to Admission medications   Medication Sig Start Date End Date Taking? Authorizing Provider  escitalopram (LEXAPRO) 10 MG tablet Take 20 mg by mouth daily.     Yes [provider]  furosemide (LASIX) 20 MG tablet Take 40 mg by mouth daily.    Yes [provider]  levothyroxine (SYNTHROID, LEVOTHROID) 75 MCG tablet Take 88 mcg by mouth daily.    Yes [provider]  Meloxicam (MOBIC PO) Take by mouth.   Yes [provider]  pravastatin (PRAVACHOL) 20 MG tablet Take 20 mg by mouth daily.     Yes [provider]  rosuvastatin (CRESTOR) 20 MG tablet Take 20 mg by mouth daily.   Yes [provider]  spironolactone-hydrochlorothiazide  (ALDACTAZIDE) 25-25 MG per tablet Take 1 tablet by mouth daily.     Yes [provider]  valsartan (DIOVAN) 40 MG tablet Take 160 mg by mouth daily.    Yes [provider]  benzonatate (TESSALON) 100 MG capsule Take 2 capsules (200 mg total) by mouth 2 (two) times daily as needed for cough. 03/17/16   Margarita Mail, PA-C  HYDROcodone-acetaminophen (NORCO) 5-325 MG tablet Take 1-2 tablets by mouth every 6 (six) hours as needed (for pain). 06/29/15   Molpus, John, MD  predniSONE (DELTASONE) 20 MG tablet Take 2 tablets (40 mg total) by mouth daily. 03/17/16   Margarita Mail, PA-C    Family History No family history on file.  Social History Social History  Substance Use Topics  . Smoking status: Former Research scientist (life sciences)  . Smokeless tobacco: Never Used  . Alcohol use Yes     Comment: wine 3x/wk     Allergies   Sulfa antibiotics   Review of Systems Review of Systems All other systems are reviewed and are negative for acute change except as noted in the HPI   Physical Exam Updated Vital Signs BP 134/90   Pulse 94   Temp 98.5 F (36.9 C) (Oral)   Resp 20   Ht 5\' 7"  (1.702 m)   Wt 79.4 kg (175 lb)  SpO2 98%   BMI 27.41 kg/m   Physical Exam  Constitutional: She is oriented to person, place, and time. She appears well-developed and well-nourished. No distress.  HENT:  Head: Normocephalic and atraumatic.  Right Ear: External ear normal.  Left Ear: External ear normal.  Nose: Nose normal.  Eyes: Conjunctivae and EOM are normal. No scleral icterus.  Neck: Normal range of motion and phonation normal.  Cardiovascular: Normal rate and regular rhythm.   Pulmonary/Chest: Effort normal. No stridor. No respiratory distress.  Abdominal: She exhibits no distension.  Musculoskeletal: Normal range of motion. She exhibits no edema.       Right hand: She exhibits tenderness, bony tenderness and swelling (To the dorsum of the right hand about the MCPs of the index, middle, ring  fingers). She exhibits normal range of motion. Normal sensation noted. Normal strength noted.       Hands: Neurological: She is alert and oriented to person, place, and time.  Skin: She is not diaphoretic.  Psychiatric: She has a normal mood and affect. Her behavior is normal.  Vitals reviewed.    ED Treatments / Results  Labs (all labs ordered are listed, but only abnormal results are displayed) Labs Reviewed - No data to display  EKG  EKG Interpretation None       Radiology Dg Hand Complete Right  Result Date: 10/19/2016 CLINICAL DATA:  As post fall injuring the right hand in is pool with now generalized swelling. Greatest pain centers over the second and third metacarpal heads. EXAM: RIGHT HAND - COMPLETE 3+ VIEW COMPARISON:  None in PACs FINDINGS: The bones are subjectively adequately mineralized. No acute fracture or dislocation is observed. Specific attention to the second and third metacarpal heads reveals no acute bony abnormality. The interphalangeal, MCP, and CMC joint spaces are reasonably well-maintained. The soft tissues exhibit no acute abnormality. IMPRESSION: There is no acute or significant chronic bony abnormality of the right hand. Electronically Signed   By: David  Martinique M.D.   On: 10/19/2016 13:35    Procedures Procedures (including critical care time)  Medications Ordered in ED Medications - No data to display   Initial Impression / Assessment and Plan / ED Course  I have reviewed the triage vital signs and the nursing notes.  Pertinent labs & imaging results that were available during my care of the patient were reviewed by me and considered in my medical decision making (see chart for details).  Clinical Course as of Oct 20 1535  Mon Oct 19, 2016  1402 Plain film w/o fracture. Likely MCP sprain and strain of the palmar interosseous muscles. Patient provided with a volar splint. Recommended RICE and OTC pain meds.  The patient is safe for discharge  with strict return precautions.   [PC]    Clinical Course User Index [PC] Cardama, Grayce Sessions, MD      Final Clinical Impressions(s) / ED Diagnoses   Final diagnoses:  Sprain of metacarpophalangeal (MCP) joint of right middle finger, initial encounter  Sprain of metacarpophalangeal (MCP) joint of right index finger, initial encounter  Strain of right hand, initial encounter      Cardama, Grayce Sessions, MD 10/19/16 1537

## 2016-10-22 DIAGNOSIS — M25562 Pain in left knee: Secondary | ICD-10-CM | POA: Diagnosis not present

## 2016-11-02 DIAGNOSIS — M25562 Pain in left knee: Secondary | ICD-10-CM | POA: Diagnosis not present

## 2016-11-05 DIAGNOSIS — M25562 Pain in left knee: Secondary | ICD-10-CM | POA: Diagnosis not present

## 2016-11-09 DIAGNOSIS — M25562 Pain in left knee: Secondary | ICD-10-CM | POA: Diagnosis not present

## 2016-11-11 DIAGNOSIS — M79641 Pain in right hand: Secondary | ICD-10-CM | POA: Diagnosis not present

## 2017-02-08 DIAGNOSIS — E559 Vitamin D deficiency, unspecified: Secondary | ICD-10-CM | POA: Diagnosis not present

## 2017-02-08 DIAGNOSIS — R1012 Left upper quadrant pain: Secondary | ICD-10-CM | POA: Diagnosis not present

## 2017-02-08 DIAGNOSIS — E039 Hypothyroidism, unspecified: Secondary | ICD-10-CM | POA: Diagnosis not present

## 2017-02-08 DIAGNOSIS — M2550 Pain in unspecified joint: Secondary | ICD-10-CM | POA: Diagnosis not present

## 2017-02-08 DIAGNOSIS — R7302 Impaired glucose tolerance (oral): Secondary | ICD-10-CM | POA: Diagnosis not present

## 2017-02-08 DIAGNOSIS — I10 Essential (primary) hypertension: Secondary | ICD-10-CM | POA: Diagnosis not present

## 2017-02-08 DIAGNOSIS — E7800 Pure hypercholesterolemia, unspecified: Secondary | ICD-10-CM | POA: Diagnosis not present

## 2017-04-19 DIAGNOSIS — M654 Radial styloid tenosynovitis [de Quervain]: Secondary | ICD-10-CM | POA: Diagnosis not present

## 2017-07-01 DIAGNOSIS — R7309 Other abnormal glucose: Secondary | ICD-10-CM | POA: Diagnosis not present

## 2017-07-01 DIAGNOSIS — I341 Nonrheumatic mitral (valve) prolapse: Secondary | ICD-10-CM | POA: Diagnosis not present

## 2017-07-01 DIAGNOSIS — E785 Hyperlipidemia, unspecified: Secondary | ICD-10-CM | POA: Diagnosis not present

## 2017-07-01 DIAGNOSIS — E559 Vitamin D deficiency, unspecified: Secondary | ICD-10-CM | POA: Diagnosis not present

## 2017-07-01 DIAGNOSIS — E039 Hypothyroidism, unspecified: Secondary | ICD-10-CM | POA: Diagnosis not present

## 2017-07-01 DIAGNOSIS — I10 Essential (primary) hypertension: Secondary | ICD-10-CM | POA: Diagnosis not present

## 2017-07-15 ENCOUNTER — Other Ambulatory Visit: Payer: Self-pay | Admitting: Internal Medicine

## 2017-07-15 DIAGNOSIS — Z1231 Encounter for screening mammogram for malignant neoplasm of breast: Secondary | ICD-10-CM

## 2017-07-20 DIAGNOSIS — Z1211 Encounter for screening for malignant neoplasm of colon: Secondary | ICD-10-CM | POA: Diagnosis not present

## 2017-07-20 DIAGNOSIS — K219 Gastro-esophageal reflux disease without esophagitis: Secondary | ICD-10-CM | POA: Diagnosis not present

## 2017-07-26 DIAGNOSIS — Z1211 Encounter for screening for malignant neoplasm of colon: Secondary | ICD-10-CM | POA: Diagnosis not present

## 2017-07-26 LAB — HM COLONOSCOPY

## 2017-07-29 DIAGNOSIS — K080 Exfoliation of teeth due to systemic causes: Secondary | ICD-10-CM | POA: Diagnosis not present

## 2017-08-17 DIAGNOSIS — Z1389 Encounter for screening for other disorder: Secondary | ICD-10-CM | POA: Diagnosis not present

## 2017-08-17 DIAGNOSIS — Z79899 Other long term (current) drug therapy: Secondary | ICD-10-CM | POA: Diagnosis not present

## 2017-08-17 DIAGNOSIS — I10 Essential (primary) hypertension: Secondary | ICD-10-CM | POA: Diagnosis not present

## 2017-08-18 DIAGNOSIS — M654 Radial styloid tenosynovitis [de Quervain]: Secondary | ICD-10-CM | POA: Diagnosis not present

## 2017-08-20 ENCOUNTER — Ambulatory Visit: Payer: Federal, State, Local not specified - PPO

## 2017-08-21 DIAGNOSIS — N6310 Unspecified lump in the right breast, unspecified quadrant: Secondary | ICD-10-CM

## 2017-08-21 HISTORY — DX: Unspecified lump in the right breast, unspecified quadrant: N63.10

## 2017-08-24 ENCOUNTER — Ambulatory Visit
Admission: RE | Admit: 2017-08-24 | Discharge: 2017-08-24 | Disposition: A | Discharge status: Home / Self Care | Payer: Federal, State, Local not specified - PPO | Source: Ambulatory Visit | Attending: Internal Medicine | Admitting: Internal Medicine

## 2017-08-24 DIAGNOSIS — Z1231 Encounter for screening mammogram for malignant neoplasm of breast: Secondary | ICD-10-CM | POA: Diagnosis not present

## 2017-08-25 ENCOUNTER — Other Ambulatory Visit: Payer: Self-pay | Admitting: Internal Medicine

## 2017-08-25 DIAGNOSIS — R928 Other abnormal and inconclusive findings on diagnostic imaging of breast: Secondary | ICD-10-CM

## 2017-08-26 DIAGNOSIS — K080 Exfoliation of teeth due to systemic causes: Secondary | ICD-10-CM | POA: Diagnosis not present

## 2017-08-27 ENCOUNTER — Ambulatory Visit
Admission: RE | Admit: 2017-08-27 | Discharge: 2017-08-27 | Disposition: A | Discharge status: Home / Self Care | Payer: Federal, State, Local not specified - PPO | Source: Ambulatory Visit | Attending: Internal Medicine | Admitting: Internal Medicine

## 2017-08-27 ENCOUNTER — Other Ambulatory Visit: Payer: Self-pay | Admitting: Internal Medicine

## 2017-08-27 DIAGNOSIS — R928 Other abnormal and inconclusive findings on diagnostic imaging of breast: Secondary | ICD-10-CM

## 2017-08-27 DIAGNOSIS — R922 Inconclusive mammogram: Secondary | ICD-10-CM | POA: Diagnosis not present

## 2017-08-27 DIAGNOSIS — N6489 Other specified disorders of breast: Secondary | ICD-10-CM

## 2017-08-27 DIAGNOSIS — N6312 Unspecified lump in the right breast, upper inner quadrant: Secondary | ICD-10-CM | POA: Diagnosis not present

## 2017-08-30 ENCOUNTER — Other Ambulatory Visit: Payer: Self-pay | Admitting: Internal Medicine

## 2017-08-30 ENCOUNTER — Ambulatory Visit
Admission: RE | Admit: 2017-08-30 | Discharge: 2017-08-30 | Disposition: A | Discharge status: Home / Self Care | Payer: Federal, State, Local not specified - PPO | Source: Ambulatory Visit | Attending: Internal Medicine | Admitting: Internal Medicine

## 2017-08-30 DIAGNOSIS — N6031 Fibrosclerosis of right breast: Secondary | ICD-10-CM | POA: Diagnosis not present

## 2017-08-30 DIAGNOSIS — N6489 Other specified disorders of breast: Secondary | ICD-10-CM

## 2017-08-30 DIAGNOSIS — N6312 Unspecified lump in the right breast, upper inner quadrant: Secondary | ICD-10-CM | POA: Diagnosis not present

## 2017-09-06 DIAGNOSIS — K080 Exfoliation of teeth due to systemic causes: Secondary | ICD-10-CM | POA: Diagnosis not present

## 2017-09-13 ENCOUNTER — Other Ambulatory Visit: Payer: Self-pay | Admitting: Surgery

## 2017-09-13 ENCOUNTER — Ambulatory Visit: Payer: Self-pay | Admitting: Surgery

## 2017-09-13 DIAGNOSIS — N6310 Unspecified lump in the right breast, unspecified quadrant: Secondary | ICD-10-CM | POA: Diagnosis not present

## 2017-09-13 NOTE — H&P (Signed)
Stephanie Dudley Documented: 09/13/2017 10:03 AM Location: [facility] Patient #: [id number] DOB: Oct 26, 1955 Married / Language: English / Race: Black or African American Female  History of Present Illness Stephanie Dudley A. [name] MD; 09/13/2017 12:41 PM) Patient words: The patient was sent at the request of Dr. [name] M.D. for a mammographic detected right breast mass. The patient went for screening mammogram and an area of distortion was detected in the right breast upper outer quadrant. Core biopsy was done which showed fibrosis but this was felt to be discordant to the imaging. The patient denies any history of breast pain, breast mass or nipple discharge bilaterally. Her sister was diagnosed with breast cancer in her late 17s recently and underwent surgery for that. There is no other family history of breast cancer besides that. She states she is sore from the biopsy but otherwise has no other complaints.                 Recall from screening mammography with tomosynthesis, possible distortion involving the INNER RIGHT breast at ANTERIOR depth. Family history of breast cancer in her sister who underwent mastectomy last year. EXAM: DIGITAL DIAGNOSTIC RIGHT MAMMOGRAM WITH TOMO ULTRASOUND RIGHT BREAST COMPARISON: Mammography 08/24/2017, 01/10/2014 and earlier. No prior ultrasound. ACR Breast Density Category c: The breast tissue is heterogeneously dense, which may obscure small masses. FINDINGS: Tomosynthesis and synthesized spot-compression CC and MLO views of the area of concern in the RIGHT breast were obtained. Spot compression tomosynthesis images demonstrate persistent architectural distortion in the INNER breast at ANTERIOR depth, likely near the 3 o'clock location. Soft tissue density is present within the distortion though I am not able to identify a discrete mass. There are no associated suspicious calcifications. On physical exam, there is  vague palpable thickening in the OUTER periareolar RIGHT breast. Targeted RIGHT breast ultrasound is performed, showing architectural distortion at the 2:30 o'clock position approximately 2 cm from the nipple with radiating hyperechoic linear foci. Within the center of the distortion is a mass that is slightly hypoechoic with vague, irregular margins which measures approximately 1.0 x 1.5 x 1.4 cm, demonstrating no posterior characteristics and demonstrating internal power Doppler flow. With tissue harmonics, the mass is significantly hypoechoic and demonstrates posterior acoustic shadowing. Sonographic evaluation of the RIGHT axilla demonstrates no pathologic lymphadenopathy. IMPRESSION: 1. Suspicious approximate 1.5 cm mass associated with architectural distortion in the UPPER OUTER RIGHT breast at the 2:30 o'clock position approximately 2 cm from the nipple. 2. No pathologic RIGHT axillary lymphadenopathy. RECOMMENDATION: Ultrasound-guided core needle biopsy of the suspicious RIGHT breast mass associated with architectural distortion. The ultrasound core needle biopsy procedure was discussed with the patient and her questions were answered. She has agreed to proceed and the biopsy has been scheduled for Monday, June 10 at 2:45 p.m. I have discussed the findings and recommendations with the patient. Results were also provided in writing at the conclusion of the visit. BI-RADS CATEGORY 5: Highly suggestive of malignancy. Electronically Signed By: Evangeline Dakin M.D. On: 08/27/2017 09:47  Result History  MM DIAG BREAST TOMO UNI RIGHT (Order #[id number]) on 08/27/2017 - Order Result History Report <[email or web address]>  MM 3D SCREEN BREAST BILATERAL (Order [id number]) Study Result  CLINICAL DATA: Screening. EXAM: DIGITAL SCREENING BILATERAL MAMMOGRAM WITH TOMO AND CAD COMPARISON: Previous exam(s). ACR Breast Density Category c: The breast tissue is heterogeneously dense,  which may obscure small masses. FINDINGS: In the right breast, possible distortion warrants further evaluation. In the left breast, no findings suspicious for malignancy.  Images were processed with CAD. IMPRESSION: Further evaluation is suggested for possible distortion in the right breast. RECOMMENDATION: Diagnostic mammogram and possibly ultrasound of the right breast. (Code:[id number]) The patient will be contacted regarding the findings, and additional imaging will be scheduled. BI-RADS CATEGORY 0: Incomplete. Need additional imaging evaluation and/or prior mammograms for comparison. Electronically Signed By: Claudie Revering M.D. On: 08/24/2017 12:17 .  The patient is a 62 year old female.   Past Surgical History Stephanie Dudley; 09/13/2017 10:03 AM) Appendectomy Breast Biopsy Bilateral. Knee Surgery Left. Thyroid Surgery Tonsillectomy  Diagnostic Studies History Stephanie Dudley; 09/13/2017 10:03 AM) Colonoscopy within last year Mammogram within last year Pap Smear 1-5 years ago  Allergies Stephanie Dudley; 09/13/2017 10:05 AM) [facility] Allergies Reconciled  Medication History Stephanie Dudley; 09/13/2017 10:05 AM) Escitalopram Oxalate (10MG  Tablet, Oral) Active. Furosemide (40MG  Tablet, Oral) Active. PredniSONE (5MG  (21) Tab Ther Pack, Oral) Active. Rosuvastatin Calcium (20MG  Tablet, Oral) Active. Spironolactone (25MG  Tablet, Oral) Active. Synthroid (88MCG Tablet, Oral) Active. Valsartan (160MG  Tablet, Oral) Active. Medications Reconciled  Social History Stephanie Dudley; 09/13/2017 10:03 AM) Alcohol use Moderate alcohol use. No caffeine use No drug use Tobacco use Former smoker.  Family History Stephanie Dudley; 09/13/2017 10:03 AM) Alcohol Abuse Brother. Arthritis Brother, Father. Breast Cancer Sister. Depression Mother. Hypertension Brother, Father, Mother. Migraine Headache Daughter. Thyroid problems Daughter, Mother.  Pregnancy /  Birth History Stephanie Dudley; 09/13/2017 10:03 AM) Age at menarche 89 years. Age of menopause 62-55 Gravida 5 Irregular periods Maternal age 79-25 Para 14  Other Problems Stephanie Dudley; 09/13/2017 10:03 AM) Arthritis High blood pressure Hypercholesterolemia Thyroid Disease     Review of Systems Stephanie Dudley; 09/13/2017 10:03 AM) General Not Present- Appetite Loss, Chills, Fatigue, Fever, Night Sweats, Weight Gain and Weight Loss. Skin Not Present- Change in Wart/Mole, Dryness, Hives, Jaundice, New Lesions, Non-Healing Wounds, Rash and Ulcer. HEENT Present- Wears glasses/contact lenses. Not Present- Earache, Hearing Loss, Hoarseness, Nose Bleed, Oral Ulcers, Ringing in the Ears, Seasonal Allergies, Sinus Pain, Sore Throat, Visual Disturbances and Yellow Eyes. Respiratory Not Present- Bloody sputum, Chronic Cough, Difficulty Breathing, Snoring and Wheezing. Breast Present- Breast Mass. Not Present- Breast Pain, Nipple Discharge and Skin Changes. Cardiovascular Present- Palpitations. Not Present- Chest Pain, Difficulty Breathing Lying Down, Leg Cramps, Rapid Heart Rate, Shortness of Breath and Swelling of Extremities. Gastrointestinal Not Present- Abdominal Pain, Bloating, Bloody Stool, Change in Bowel Habits, Chronic diarrhea, Constipation, Difficulty Swallowing, Excessive gas, Gets full quickly at meals, Hemorrhoids, Indigestion, Nausea, Rectal Pain and Vomiting. Female Genitourinary Not Present- Frequency, Nocturia, Painful Urination, Pelvic Pain and Urgency. Musculoskeletal Present- Joint Pain. Not Present- Back Pain, Joint Stiffness, Muscle Pain, Muscle Weakness and Swelling of Extremities. Neurological Not Present- Decreased Memory, Fainting, Headaches, Numbness, Seizures, Tingling, Tremor, Trouble walking and Weakness. Psychiatric Not Present- Anxiety, Bipolar, Change in Sleep Pattern, Depression, Fearful and Frequent crying. Endocrine Present- Heat Intolerance and Hot  flashes. Not Present- Cold Intolerance, Excessive Hunger, Hair Changes and New Diabetes. Hematology Present- Easy Bruising. Not Present- Blood Thinners, Excessive bleeding, Gland problems, HIV and Persistent Infections.  Vitals Stephanie Dudley; 09/13/2017 10:06 AM) 09/13/2017 10:05 AM Weight: 183.25 lb Height: 66in Body Surface Area: 1.93 m Body Mass Index: 29.58 kg/m  Temp.: 98.14F(Oral)  Pulse: 95 (Regular)  BP: 132/80 (Sitting, Left Arm, Standard)      Physical Exam (Tyrrell Stephens A. Saifullah Jolley MD; 09/13/2017 12:42 PM)  General Mental Status-Alert. General Appearance-Consistent with stated age. Hydration-Well hydrated. Voice-Normal.  Head and Neck Head-normocephalic, atraumatic with no lesions or palpable masses. Trachea-midline. Thyroid Gland  Characteristics - normal size and consistency.  Eye Eyeball - Bilateral-Extraocular movements intact. Sclera/Conjunctiva - Bilateral-No scleral icterus.  Chest and Lung Exam Chest and lung exam reveals -quiet, even and easy respiratory effort with no use of accessory muscles and on auscultation, normal breath sounds, no adventitious sounds and normal vocal resonance. Inspection Chest Wall - Normal. Back - normal.  Breast Note: Mild soreness and swelling of the right breast but no signs of infection biopsy site. Left breast is normal. No masses otherwise. No nipple discharge noted bilaterally.  Cardiovascular Cardiovascular examination reveals -normal heart sounds, regular rate and rhythm with no murmurs and normal pedal pulses bilaterally.  Neurologic Neurologic evaluation reveals -alert and oriented x 3 with no impairment of recent or remote memory. Mental Status-Normal.  Musculoskeletal Normal Exam - Left-Upper Extremity Strength Normal and Lower Extremity Strength Normal. Normal Exam - Right-Upper Extremity Strength Normal and Lower Extremity Strength Normal.  Lymphatic Head &  Neck  General Head & Neck Lymphatics: Bilateral - Description - Normal. Axillary  General Axillary Region: Bilateral - Description - Normal. Tenderness - Non Tender.    Assessment & Plan (Shmuel Girgis A. Newton Frutiger MD; 09/13/2017 12:42 PM)  MASS OF RIGHT BREAST ON MAMMOGRAM (N63.10) Impression: Pathology felt to be discordant from the imaging. Discussed potential upgrade risk of 10%. Recommend right breast seed localized lumpectomy. Discussed observation as well as surgery with the risks and benefits of each. Risk of lumpectomy include bleeding, infection, seroma, more surgery, use of seed/wire, wound care, cosmetic deformity and the need for other treatments, death , blood clots, death. Pt agrees to proceed.  Current Plans Pt Education - [facility] Breast Biopsy HCI: discussed with patient and provided information. The anatomy and the physiology was discussed. The pathophysiology and natural history of the disease was discussed. Options were discussed and recommendations were made. Technique, risks, benefits, & alternatives were discussed. Risks such as stroke, heart attack, bleeding, indection, death, and other risks discussed. Questions answered. The patient agrees to proceed.

## 2017-09-13 NOTE — H&P (View-Only) (Signed)
Stephanie Dudley Documented: 09/13/2017 10:03 AM Location: [facility] Patient #: [id number] DOB: June 04, 1955 Married / Language: English / Race: Black or African American Female  History of Present Illness Stephanie Moores A. [name] MD; 09/13/2017 12:41 PM) Patient words: The patient was sent at the request of Dr. [name] M.D. for a mammographic detected right breast mass. The patient went for screening mammogram and an area of distortion was detected in the right breast upper outer quadrant. Core biopsy was done which showed fibrosis but this was felt to be discordant to the imaging. The patient denies any history of breast pain, breast mass or nipple discharge bilaterally. Her sister was diagnosed with breast cancer in her late 82s recently and underwent surgery for that. There is no other family history of breast cancer besides that. She states she is sore from the biopsy but otherwise has no other complaints.                 Recall from screening mammography with tomosynthesis, possible distortion involving the INNER RIGHT breast at ANTERIOR depth. Family history of breast cancer in her sister who underwent mastectomy last year. EXAM: DIGITAL DIAGNOSTIC RIGHT MAMMOGRAM WITH TOMO ULTRASOUND RIGHT BREAST COMPARISON: Mammography 08/24/2017, 01/10/2014 and earlier. No prior ultrasound. ACR Breast Density Category c: The breast tissue is heterogeneously dense, which may obscure small masses. FINDINGS: Tomosynthesis and synthesized spot-compression CC and MLO views of the area of concern in the RIGHT breast were obtained. Spot compression tomosynthesis images demonstrate persistent architectural distortion in the INNER breast at ANTERIOR depth, likely near the 3 o'clock location. Soft tissue density is present within the distortion though I am not able to identify a discrete mass. There are no associated suspicious calcifications. On physical exam, there is  vague palpable thickening in the OUTER periareolar RIGHT breast. Targeted RIGHT breast ultrasound is performed, showing architectural distortion at the 2:30 o'clock position approximately 2 cm from the nipple with radiating hyperechoic linear foci. Within the center of the distortion is a mass that is slightly hypoechoic with vague, irregular margins which measures approximately 1.0 x 1.5 x 1.4 cm, demonstrating no posterior characteristics and demonstrating internal power Doppler flow. With tissue harmonics, the mass is significantly hypoechoic and demonstrates posterior acoustic shadowing. Sonographic evaluation of the RIGHT axilla demonstrates no pathologic lymphadenopathy. IMPRESSION: 1. Suspicious approximate 1.5 cm mass associated with architectural distortion in the UPPER OUTER RIGHT breast at the 2:30 o'clock position approximately 2 cm from the nipple. 2. No pathologic RIGHT axillary lymphadenopathy. RECOMMENDATION: Ultrasound-guided core needle biopsy of the suspicious RIGHT breast mass associated with architectural distortion. The ultrasound core needle biopsy procedure was discussed with the patient and her questions were answered. She has agreed to proceed and the biopsy has been scheduled for Monday, June 10 at 2:45 p.m. I have discussed the findings and recommendations with the patient. Results were also provided in writing at the conclusion of the visit. BI-RADS CATEGORY 5: Highly suggestive of malignancy. Electronically Signed By: Evangeline Dakin M.D. On: 08/27/2017 09:47  Result History  MM DIAG BREAST TOMO UNI RIGHT (Order #[id number]) on 08/27/2017 - Order Result History Report <[email or web address]>  MM 3D SCREEN BREAST BILATERAL (Order [id number]) Study Result  CLINICAL DATA: Screening. EXAM: DIGITAL SCREENING BILATERAL MAMMOGRAM WITH TOMO AND CAD COMPARISON: Previous exam(s). ACR Breast Density Category c: The breast tissue is heterogeneously dense,  which may obscure small masses. FINDINGS: In the right breast, possible distortion warrants further evaluation. In the left breast, no findings suspicious for malignancy.  Images were processed with CAD. IMPRESSION: Further evaluation is suggested for possible distortion in the right breast. RECOMMENDATION: Diagnostic mammogram and possibly ultrasound of the right breast. (Code:[id number]) The patient will be contacted regarding the findings, and additional imaging will be scheduled. BI-RADS CATEGORY 0: Incomplete. Need additional imaging evaluation and/or prior mammograms for comparison. Electronically Signed By: Claudie Revering M.D. On: 08/24/2017 12:17 .  The patient is a 62 year old female.   Past Surgical History [name]; 09/13/2017 10:03 AM) Appendectomy Breast Biopsy Bilateral. Knee Surgery Left. Thyroid Surgery Tonsillectomy  Diagnostic Studies History [name]; 09/13/2017 10:03 AM) Colonoscopy within last year Mammogram within last year Pap Smear 1-5 years ago  Allergies [name]; 09/13/2017 10:05 AM) [facility] Allergies Reconciled  Medication History [name]; 09/13/2017 10:05 AM) Escitalopram Oxalate (10MG  Tablet, Oral) Active. Furosemide (40MG  Tablet, Oral) Active. PredniSONE (5MG  (21) Tab Ther Pack, Oral) Active. Rosuvastatin Calcium (20MG  Tablet, Oral) Active. Spironolactone (25MG  Tablet, Oral) Active. Synthroid (88MCG Tablet, Oral) Active. Valsartan (160MG  Tablet, Oral) Active. Medications Reconciled  Social History [name]; 09/13/2017 10:03 AM) Alcohol use Moderate alcohol use. No caffeine use No drug use Tobacco use Former smoker.  Family History [name]; 09/13/2017 10:03 AM) Alcohol Abuse Brother. Arthritis Brother, Father. Breast Cancer Sister. Depression Mother. Hypertension Brother, Father, Mother. Migraine Headache Daughter. Thyroid problems Daughter, Mother.  Pregnancy /  Birth History [name]; 09/13/2017 10:03 AM) Age at menarche 71 years. Age of menopause 35-55 Gravida 5 Irregular periods Maternal age 34-25 Para 4  Other Problems [name]; 09/13/2017 10:03 AM) Arthritis High blood pressure Hypercholesterolemia Thyroid Disease     Review of Systems [name]; 09/13/2017 10:03 AM) General Not Present- Appetite Loss, Chills, Fatigue, Fever, Night Sweats, Weight Gain and Weight Loss. Skin Not Present- Change in Wart/Mole, Dryness, Hives, Jaundice, New Lesions, Non-Healing Wounds, Rash and Ulcer. HEENT Present- Wears glasses/contact lenses. Not Present- Earache, Hearing Loss, Hoarseness, Nose Bleed, Oral Ulcers, Ringing in the Ears, Seasonal Allergies, Sinus Pain, Sore Throat, Visual Disturbances and Yellow Eyes. Respiratory Not Present- Bloody sputum, Chronic Cough, Difficulty Breathing, Snoring and Wheezing. Breast Present- Breast Mass. Not Present- Breast Pain, Nipple Discharge and Skin Changes. Cardiovascular Present- Palpitations. Not Present- Chest Pain, Difficulty Breathing Lying Down, Leg Cramps, Rapid Heart Rate, Shortness of Breath and Swelling of Extremities. Gastrointestinal Not Present- Abdominal Pain, Bloating, Bloody Stool, Change in Bowel Habits, Chronic diarrhea, Constipation, Difficulty Swallowing, Excessive gas, Gets full quickly at meals, Hemorrhoids, Indigestion, Nausea, Rectal Pain and Vomiting. Female Genitourinary Not Present- Frequency, Nocturia, Painful Urination, Pelvic Pain and Urgency. Musculoskeletal Present- Joint Pain. Not Present- Back Pain, Joint Stiffness, Muscle Pain, Muscle Weakness and Swelling of Extremities. Neurological Not Present- Decreased Memory, Fainting, Headaches, Numbness, Seizures, Tingling, Tremor, Trouble walking and Weakness. Psychiatric Not Present- Anxiety, Bipolar, Change in Sleep Pattern, Depression, Fearful and Frequent crying. Endocrine Present- Heat Intolerance and Hot  flashes. Not Present- Cold Intolerance, Excessive Hunger, Hair Changes and New Diabetes. Hematology Present- Easy Bruising. Not Present- Blood Thinners, Excessive bleeding, Gland problems, HIV and Persistent Infections.  Vitals [name]; 09/13/2017 10:06 AM) 09/13/2017 10:05 AM Weight: 183.25 lb Height: 66in Body Surface Area: 1.93 m Body Mass Index: 29.58 kg/m  Temp.: 98.99F(Oral)  Pulse: 95 (Regular)  BP: 132/80 (Sitting, Left Arm, Standard)      Physical Exam (Lyndsay Talamante A. Hosey Burmester MD; 09/13/2017 12:42 PM)  General Mental Status-Alert. General Appearance-Consistent with stated age. Hydration-Well hydrated. Voice-Normal.  Head and Neck Head-normocephalic, atraumatic with no lesions or palpable masses. Trachea-midline. Thyroid Gland  Characteristics - normal size and consistency.  Eye Eyeball - Bilateral-Extraocular movements intact. Sclera/Conjunctiva - Bilateral-No scleral icterus.  Chest and Lung Exam Chest and lung exam reveals -quiet, even and easy respiratory effort with no use of accessory muscles and on auscultation, normal breath sounds, no adventitious sounds and normal vocal resonance. Inspection Chest Wall - Normal. Back - normal.  Breast Note: Mild soreness and swelling of the right breast but no signs of infection biopsy site. Left breast is normal. No masses otherwise. No nipple discharge noted bilaterally.  Cardiovascular Cardiovascular examination reveals -normal heart sounds, regular rate and rhythm with no murmurs and normal pedal pulses bilaterally.  Neurologic Neurologic evaluation reveals -alert and oriented x 3 with no impairment of recent or remote memory. Mental Status-Normal.  Musculoskeletal Normal Exam - Left-Upper Extremity Strength Normal and Lower Extremity Strength Normal. Normal Exam - Right-Upper Extremity Strength Normal and Lower Extremity Strength Normal.  Lymphatic Head &  Neck  General Head & Neck Lymphatics: Bilateral - Description - Normal. Axillary  General Axillary Region: Bilateral - Description - Normal. Tenderness - Non Tender.    Assessment & Plan (Quinton Voth A. Anastacio Bua MD; 09/13/2017 12:42 PM)  MASS OF RIGHT BREAST ON MAMMOGRAM (N63.10) Impression: Pathology felt to be discordant from the imaging. Discussed potential upgrade risk of 10%. Recommend right breast seed localized lumpectomy. Discussed observation as well as surgery with the risks and benefits of each. Risk of lumpectomy include bleeding, infection, seroma, more surgery, use of seed/wire, wound care, cosmetic deformity and the need for other treatments, death , blood clots, death. Pt agrees to proceed.  Current Plans Pt Education - [facility] Breast Biopsy HCI: discussed with patient and provided information. The anatomy and the physiology was discussed. The pathophysiology and natural history of the disease was discussed. Options were discussed and recommendations were made. Technique, risks, benefits, & alternatives were discussed. Risks such as stroke, heart attack, bleeding, indection, death, and other risks discussed. Questions answered. The patient agrees to proceed.

## 2017-09-15 ENCOUNTER — Telehealth: Payer: Self-pay | Admitting: Genetics

## 2017-09-15 ENCOUNTER — Encounter: Payer: Self-pay | Admitting: Genetics

## 2017-09-15 NOTE — Telephone Encounter (Signed)
A genetic counseling referral has been received from Dr. Brantley Stage at [facility]. Pt scheduled to see Ferol Luz on 8/6 at 10am. Pt aware to arrive 30 minutes early. Letter mailed.

## 2017-09-20 ENCOUNTER — Encounter (HOSPITAL_BASED_OUTPATIENT_CLINIC_OR_DEPARTMENT_OTHER): Payer: Self-pay | Admitting: *Deleted

## 2017-09-20 ENCOUNTER — Other Ambulatory Visit: Payer: Self-pay

## 2017-09-22 ENCOUNTER — Encounter (HOSPITAL_BASED_OUTPATIENT_CLINIC_OR_DEPARTMENT_OTHER)
Admission: RE | Admit: 2017-09-22 | Discharge: 2017-09-22 | Disposition: A | Discharge status: Home / Self Care | Payer: Federal, State, Local not specified - PPO | Source: Ambulatory Visit | Attending: Surgery | Admitting: Surgery

## 2017-09-22 DIAGNOSIS — Z01812 Encounter for preprocedural laboratory examination: Secondary | ICD-10-CM | POA: Insufficient documentation

## 2017-09-22 DIAGNOSIS — Z01810 Encounter for preprocedural cardiovascular examination: Secondary | ICD-10-CM | POA: Diagnosis not present

## 2017-09-22 LAB — BASIC METABOLIC PANEL
ANION GAP: 10 (ref 5–15)
BUN: 9 mg/dL (ref 6–20)
CHLORIDE: 103 mmol/L (ref 98–111)
CO2: 26 mmol/L (ref 22–32)
CREATININE: 0.72 mg/dL (ref 0.44–1.00)
Calcium: 9.3 mg/dL (ref 8.9–10.3)
GFR calc non Af Amer: >60 mL/min (ref >60)
GLUCOSE: 90 mg/dL (ref 70–99)
Potassium: 4.4 mmol/L (ref 3.5–5.1)
Sodium: 139 mmol/L (ref 135–145)

## 2017-09-22 NOTE — Progress Notes (Signed)
EKG and BMET completed as per order and tolerated well by patient. Ensure given and pt instructed to drink it by 0900 hrs DOS. Pt verbalized understanding.

## 2017-09-27 ENCOUNTER — Ambulatory Visit
Admission: RE | Admit: 2017-09-27 | Discharge: 2017-09-27 | Disposition: A | Discharge status: Home / Self Care | Payer: Federal, State, Local not specified - PPO | Source: Ambulatory Visit | Attending: Surgery | Admitting: Surgery

## 2017-09-27 DIAGNOSIS — R928 Other abnormal and inconclusive findings on diagnostic imaging of breast: Secondary | ICD-10-CM | POA: Diagnosis not present

## 2017-09-27 DIAGNOSIS — N6310 Unspecified lump in the right breast, unspecified quadrant: Secondary | ICD-10-CM

## 2017-09-28 ENCOUNTER — Encounter (HOSPITAL_BASED_OUTPATIENT_CLINIC_OR_DEPARTMENT_OTHER)
Admission: RE | Disposition: A | Discharge status: Home / Self Care | Payer: Self-pay | Source: Ambulatory Visit | Attending: Surgery

## 2017-09-28 ENCOUNTER — Ambulatory Visit (HOSPITAL_BASED_OUTPATIENT_CLINIC_OR_DEPARTMENT_OTHER)
Admission: RE | Admit: 2017-09-28 | Discharge: 2017-09-28 | Disposition: A | Discharge status: Home / Self Care | Payer: Federal, State, Local not specified - PPO | Source: Ambulatory Visit | Attending: Surgery | Admitting: Surgery

## 2017-09-28 ENCOUNTER — Ambulatory Visit (HOSPITAL_BASED_OUTPATIENT_CLINIC_OR_DEPARTMENT_OTHER): Payer: Federal, State, Local not specified - PPO | Admitting: Anesthesiology

## 2017-09-28 ENCOUNTER — Encounter (HOSPITAL_BASED_OUTPATIENT_CLINIC_OR_DEPARTMENT_OTHER): Payer: Self-pay | Admitting: Certified Registered"

## 2017-09-28 ENCOUNTER — Ambulatory Visit
Admission: RE | Admit: 2017-09-28 | Discharge: 2017-09-28 | Disposition: A | Discharge status: Home / Self Care | Payer: Federal, State, Local not specified - PPO | Source: Ambulatory Visit | Attending: Surgery | Admitting: Surgery

## 2017-09-28 DIAGNOSIS — N6310 Unspecified lump in the right breast, unspecified quadrant: Secondary | ICD-10-CM | POA: Diagnosis not present

## 2017-09-28 DIAGNOSIS — Z803 Family history of malignant neoplasm of breast: Secondary | ICD-10-CM | POA: Insufficient documentation

## 2017-09-28 DIAGNOSIS — I10 Essential (primary) hypertension: Secondary | ICD-10-CM | POA: Insufficient documentation

## 2017-09-28 DIAGNOSIS — N6011 Diffuse cystic mastopathy of right breast: Secondary | ICD-10-CM | POA: Diagnosis not present

## 2017-09-28 DIAGNOSIS — E7800 Pure hypercholesterolemia, unspecified: Secondary | ICD-10-CM | POA: Insufficient documentation

## 2017-09-28 DIAGNOSIS — E039 Hypothyroidism, unspecified: Secondary | ICD-10-CM | POA: Diagnosis not present

## 2017-09-28 DIAGNOSIS — Z79899 Other long term (current) drug therapy: Secondary | ICD-10-CM | POA: Insufficient documentation

## 2017-09-28 DIAGNOSIS — Z882 Allergy status to sulfonamides status: Secondary | ICD-10-CM | POA: Insufficient documentation

## 2017-09-28 DIAGNOSIS — Z87891 Personal history of nicotine dependence: Secondary | ICD-10-CM | POA: Insufficient documentation

## 2017-09-28 DIAGNOSIS — M1990 Unspecified osteoarthritis, unspecified site: Secondary | ICD-10-CM | POA: Insufficient documentation

## 2017-09-28 DIAGNOSIS — R928 Other abnormal and inconclusive findings on diagnostic imaging of breast: Secondary | ICD-10-CM | POA: Diagnosis not present

## 2017-09-28 HISTORY — PX: BREAST LUMPECTOMY WITH RADIOACTIVE SEED LOCALIZATION: SHX6424

## 2017-09-28 HISTORY — DX: Other complications of anesthesia, initial encounter: T88.59XA

## 2017-09-28 HISTORY — DX: Edema, unspecified: R60.9

## 2017-09-28 HISTORY — DX: Localized edema: R60.0

## 2017-09-28 HISTORY — DX: Unspecified osteoarthritis, unspecified site: M19.90

## 2017-09-28 HISTORY — DX: Hypothyroidism, unspecified: E03.9

## 2017-09-28 HISTORY — DX: Adverse effect of unspecified anesthetic, initial encounter: T41.45XA

## 2017-09-28 SURGERY — BREAST LUMPECTOMY WITH RADIOACTIVE SEED LOCALIZATION
Anesthesia: General | Site: Breast | Laterality: Right

## 2017-09-28 MED ORDER — DEXAMETHASONE SODIUM PHOSPHATE 10 MG/ML IJ SOLN
INTRAMUSCULAR | Status: AC
  Filled 2017-09-28: qty 1

## 2017-09-28 MED ORDER — ACETAMINOPHEN 325 MG PO TABS: 325.0000 mg | ORAL_TABLET | ORAL | Status: DC | PRN

## 2017-09-28 MED ORDER — OXYCODONE HCL 5 MG PO TABS
ORAL_TABLET | ORAL | Status: AC
  Filled 2017-09-28: qty 1

## 2017-09-28 MED ORDER — CEFAZOLIN SODIUM-DEXTROSE 2-3 GM-%(50ML) IV SOLR
INTRAVENOUS | Status: DC | PRN
  Administered 2017-09-28: 2 g via INTRAVENOUS

## 2017-09-28 MED ORDER — CHLORHEXIDINE GLUCONATE CLOTH 2 % EX PADS: 6.0000 | MEDICATED_PAD | Freq: Once | CUTANEOUS | Status: DC

## 2017-09-28 MED ORDER — DEXTROSE 5 % IV SOLN: 3.0000 g | INTRAVENOUS | Status: DC

## 2017-09-28 MED ORDER — FENTANYL CITRATE (PF) 100 MCG/2ML IJ SOLN
INTRAMUSCULAR | Status: AC
  Filled 2017-09-28: qty 2

## 2017-09-28 MED ORDER — PROPOFOL 10 MG/ML IV BOLUS
INTRAVENOUS | Status: AC
  Filled 2017-09-28: qty 20

## 2017-09-28 MED ORDER — BUPIVACAINE-EPINEPHRINE (PF) 0.25% -1:200000 IJ SOLN
INTRAMUSCULAR | Status: AC
  Filled 2017-09-28: qty 30

## 2017-09-28 MED ORDER — ACETAMINOPHEN 160 MG/5ML PO SOLN: 325.0000 mg | ORAL | Status: DC | PRN

## 2017-09-28 MED ORDER — OXYCODONE HCL 5 MG/5ML PO SOLN: 5.0000 mg | Freq: Once | ORAL | Status: AC | PRN

## 2017-09-28 MED ORDER — LACTATED RINGERS IV SOLN
INTRAVENOUS | Status: DC
  Administered 2017-09-28: 12:00:00 via INTRAVENOUS

## 2017-09-28 MED ORDER — MIDAZOLAM HCL 2 MG/2ML IJ SOLN
INTRAMUSCULAR | Status: AC
  Filled 2017-09-28: qty 2

## 2017-09-28 MED ORDER — ONDANSETRON HCL 4 MG/2ML IJ SOLN
INTRAMUSCULAR | Status: DC | PRN
  Administered 2017-09-28: 4 mg via INTRAVENOUS

## 2017-09-28 MED ORDER — PHENYLEPHRINE 40 MCG/ML (10ML) SYRINGE FOR IV PUSH (FOR BLOOD PRESSURE SUPPORT)
PREFILLED_SYRINGE | INTRAVENOUS | Status: AC
  Filled 2017-09-28: qty 10

## 2017-09-28 MED ORDER — MIDAZOLAM HCL 2 MG/2ML IJ SOLN: 1.0000 mg | INTRAMUSCULAR | Status: DC | PRN

## 2017-09-28 MED ORDER — ACETAMINOPHEN 500 MG PO TABS
ORAL_TABLET | ORAL | Status: AC
  Filled 2017-09-28: qty 2

## 2017-09-28 MED ORDER — OXYCODONE HCL 5 MG PO TABS: 5.0000 mg | ORAL_TABLET | Freq: Four times a day (QID) | ORAL | 0 refills | Status: DC | PRN

## 2017-09-28 MED ORDER — FENTANYL CITRATE (PF) 100 MCG/2ML IJ SOLN
25.0000 ug | INTRAMUSCULAR | Status: DC | PRN
  Administered 2017-09-28 (×2): 50 ug via INTRAVENOUS

## 2017-09-28 MED ORDER — GABAPENTIN 300 MG PO CAPS
ORAL_CAPSULE | ORAL | Status: AC
Start: 2017-09-28 — End: ?
  Filled 2017-09-28: qty 1

## 2017-09-28 MED ORDER — FENTANYL CITRATE (PF) 100 MCG/2ML IJ SOLN: 50.0000 ug | INTRAMUSCULAR | Status: DC | PRN

## 2017-09-28 MED ORDER — GABAPENTIN 300 MG PO CAPS
300.0000 mg | ORAL_CAPSULE | ORAL | Status: AC
  Administered 2017-09-28: 300 mg via ORAL

## 2017-09-28 MED ORDER — FENTANYL CITRATE (PF) 100 MCG/2ML IJ SOLN
INTRAMUSCULAR | Status: DC | PRN
  Administered 2017-09-28 (×2): 50 ug via INTRAVENOUS

## 2017-09-28 MED ORDER — LIDOCAINE 2% (20 MG/ML) 5 ML SYRINGE
INTRAMUSCULAR | Status: DC | PRN
  Administered 2017-09-28: 60 mg via INTRAVENOUS

## 2017-09-28 MED ORDER — MEPERIDINE HCL 25 MG/ML IJ SOLN: 6.2500 mg | INTRAMUSCULAR | Status: DC | PRN

## 2017-09-28 MED ORDER — ONDANSETRON HCL 4 MG/2ML IJ SOLN: 4.0000 mg | Freq: Once | INTRAMUSCULAR | Status: DC | PRN

## 2017-09-28 MED ORDER — LIDOCAINE HCL (CARDIAC) PF 100 MG/5ML IV SOSY
PREFILLED_SYRINGE | INTRAVENOUS | Status: AC
Start: 2017-09-28 — End: ?
  Filled 2017-09-28: qty 5

## 2017-09-28 MED ORDER — IBUPROFEN 800 MG PO TABS: 800.0000 mg | ORAL_TABLET | Freq: Three times a day (TID) | ORAL | 0 refills | Status: DC | PRN

## 2017-09-28 MED ORDER — ACETAMINOPHEN 500 MG PO TABS
1000.0000 mg | ORAL_TABLET | ORAL | Status: AC
  Administered 2017-09-28: 1000 mg via ORAL

## 2017-09-28 MED ORDER — CEFAZOLIN SODIUM-DEXTROSE 2-4 GM/100ML-% IV SOLN
INTRAVENOUS | Status: AC
  Filled 2017-09-28: qty 100

## 2017-09-28 MED ORDER — BUPIVACAINE-EPINEPHRINE (PF) 0.25% -1:200000 IJ SOLN
INTRAMUSCULAR | Status: DC | PRN
  Administered 2017-09-28: 20 mL

## 2017-09-28 MED ORDER — SCOPOLAMINE 1 MG/3DAYS TD PT72: 1.0000 | MEDICATED_PATCH | Freq: Once | TRANSDERMAL | Status: DC | PRN

## 2017-09-28 MED ORDER — OXYCODONE HCL 5 MG PO TABS
5.0000 mg | ORAL_TABLET | Freq: Once | ORAL | Status: AC | PRN
  Administered 2017-09-28: 5 mg via ORAL

## 2017-09-28 MED ORDER — PROPOFOL 10 MG/ML IV BOLUS
INTRAVENOUS | Status: DC | PRN
  Administered 2017-09-28: 150 mg via INTRAVENOUS

## 2017-09-28 MED ORDER — ONDANSETRON HCL 4 MG/2ML IJ SOLN
INTRAMUSCULAR | Status: AC
Start: 2017-09-28 — End: ?
  Filled 2017-09-28: qty 2

## 2017-09-28 MED ORDER — PHENYLEPHRINE 40 MCG/ML (10ML) SYRINGE FOR IV PUSH (FOR BLOOD PRESSURE SUPPORT)
PREFILLED_SYRINGE | INTRAVENOUS | Status: DC | PRN
  Administered 2017-09-28: 160 ug via INTRAVENOUS
  Administered 2017-09-28 (×2): 80 ug via INTRAVENOUS
  Administered 2017-09-28 (×2): 120 ug via INTRAVENOUS
  Administered 2017-09-28 (×3): 80 ug via INTRAVENOUS

## 2017-09-28 MED ORDER — MIDAZOLAM HCL 5 MG/5ML IJ SOLN
INTRAMUSCULAR | Status: DC | PRN
  Administered 2017-09-28: 2 mg via INTRAVENOUS

## 2017-09-28 MED ORDER — DEXAMETHASONE SODIUM PHOSPHATE 10 MG/ML IJ SOLN
INTRAMUSCULAR | Status: DC | PRN
  Administered 2017-09-28: 10 mg via INTRAVENOUS

## 2017-09-28 SURGICAL SUPPLY — 49 items
ADH SKN CLS APL DERMABOND .7 (GAUZE/BANDAGES/DRESSINGS) ×1
APPLIER CLIP 9.375 MED OPEN (MISCELLANEOUS) ×2
APR CLP MED 9.3 20 MLT OPN (MISCELLANEOUS) ×1
BINDER BREAST LRG (GAUZE/BANDAGES/DRESSINGS) IMPLANT
BINDER BREAST MEDIUM (GAUZE/BANDAGES/DRESSINGS) IMPLANT
BINDER BREAST XLRG (GAUZE/BANDAGES/DRESSINGS) ×1 IMPLANT
BINDER BREAST XXLRG (GAUZE/BANDAGES/DRESSINGS) IMPLANT
BLADE SURG 15 STRL LF DISP TIS (BLADE) ×1 IMPLANT
BLADE SURG 15 STRL SS (BLADE) ×2
CANISTER SUC SOCK COL 7IN (MISCELLANEOUS) IMPLANT
CANISTER SUCT 1200ML W/VALVE (MISCELLANEOUS) IMPLANT
CHLORAPREP W/TINT 26ML (MISCELLANEOUS) ×2 IMPLANT
CLIP APPLIE 9.375 MED OPEN (MISCELLANEOUS) IMPLANT
COVER BACK TABLE 60X90IN (DRAPES) ×2 IMPLANT
COVER MAYO STAND STRL (DRAPES) ×2 IMPLANT
COVER PROBE W GEL 5X96 (DRAPES) ×2 IMPLANT
DECANTER SPIKE VIAL GLASS SM (MISCELLANEOUS) IMPLANT
DERMABOND ADVANCED (GAUZE/BANDAGES/DRESSINGS) ×1
DERMABOND ADVANCED .7 DNX12 (GAUZE/BANDAGES/DRESSINGS) ×1 IMPLANT
DEVICE DUBIN W/COMP PLATE 8390 (MISCELLANEOUS) ×2 IMPLANT
DRAPE LAPAROSCOPIC ABDOMINAL (DRAPES) IMPLANT
DRAPE LAPAROTOMY 100X72 PEDS (DRAPES) ×2 IMPLANT
DRAPE UTILITY XL STRL (DRAPES) ×2 IMPLANT
ELECT COATED BLADE 2.86 ST (ELECTRODE) ×2 IMPLANT
ELECT REM PT RETURN 9FT ADLT (ELECTROSURGICAL) ×2
ELECTRODE REM PT RTRN 9FT ADLT (ELECTROSURGICAL) ×1 IMPLANT
GLOVE BIOGEL PI IND STRL 8 (GLOVE) ×1 IMPLANT
GLOVE BIOGEL PI INDICATOR 8 (GLOVE) ×1
GLOVE ECLIPSE 8.0 STRL XLNG CF (GLOVE) ×2 IMPLANT
GOWN STRL REUS W/ TWL LRG LVL3 (GOWN DISPOSABLE) ×2 IMPLANT
GOWN STRL REUS W/TWL LRG LVL3 (GOWN DISPOSABLE) ×4
HEMOSTAT ARISTA ABSORB 3G PWDR (MISCELLANEOUS) IMPLANT
HEMOSTAT SNOW SURGICEL 2X4 (HEMOSTASIS) IMPLANT
KIT MARKER MARGIN INK (KITS) ×2 IMPLANT
NDL HYPO 25X1 1.5 SAFETY (NEEDLE) ×1 IMPLANT
NEEDLE HYPO 25X1 1.5 SAFETY (NEEDLE) ×2 IMPLANT
NS IRRIG 1000ML POUR BTL (IV SOLUTION) ×2 IMPLANT
PACK BASIN DAY SURGERY FS (CUSTOM PROCEDURE TRAY) ×2 IMPLANT
PENCIL BUTTON HOLSTER BLD 10FT (ELECTRODE) ×2 IMPLANT
SLEEVE SCD COMPRESS KNEE MED (MISCELLANEOUS) ×2 IMPLANT
SPONGE LAP 4X18 RFD (DISPOSABLE) ×2 IMPLANT
SUT MNCRL AB 4-0 PS2 18 (SUTURE) ×2 IMPLANT
SUT SILK 2 0 SH (SUTURE) IMPLANT
SUT VICRYL 3-0 CR8 SH (SUTURE) ×2 IMPLANT
SYR CONTROL 10ML LL (SYRINGE) ×2 IMPLANT
TOWEL GREEN STERILE FF (TOWEL DISPOSABLE) ×2 IMPLANT
TOWEL OR NON WOVEN STRL DISP B (DISPOSABLE) ×1 IMPLANT
TUBE CONNECTING 20X1/4 (TUBING) IMPLANT
YANKAUER SUCT BULB TIP NO VENT (SUCTIONS) IMPLANT

## 2017-09-28 NOTE — Interval H&P Note (Signed)
History and Physical Interval Note:  09/28/2017 11:18 AM  Stephanie Dudley  has presented today for surgery, with the diagnosis of RIGHT BREAST MASS  The various methods of treatment have been discussed with the patient and family. After consideration of risks, benefits and other options for treatment, the patient has consented to  Procedure(s): RIGHT BREAST LUMPECTOMY WITH RADIOACTIVE SEED LOCALIZATION (Right) as a surgical intervention .  The patient's history has been reviewed, patient examined, no change in status, stable for surgery.  I have reviewed the patient's chart and labs.  Questions were answered to the patient's satisfaction.     [place]

## 2017-09-28 NOTE — Anesthesia Procedure Notes (Signed)
Procedure Name: LMA Insertion Date/Time: 09/28/2017 12:11 PM Performed by: Gwyndolyn Saxon, CRNA Pre-anesthesia Checklist: Patient identified, Emergency Drugs available, Suction available, Patient being monitored and Timeout performed Patient Re-evaluated:Patient Re-evaluated prior to induction Oxygen Delivery Method: Circle system utilized Preoxygenation: Pre-oxygenation with 100% oxygen Induction Type: IV induction Ventilation: Mask ventilation without difficulty LMA: LMA with gastric port inserted Number of attempts: 1 Placement Confirmation: positive ETCO2,  CO2 detector and breath sounds checked- equal and bilateral Tube secured with: Tape Dental Injury: Teeth and Oropharynx as per pre-operative assessment

## 2017-09-28 NOTE — Op Note (Signed)
Preoperative diagnosis: Right breast mass  Postoperative diagnosis: Same  Procedure: Right breast seed localized lumpectomy  Surgeon: Erroll Luna, MD  Anesthesia: LMA with local  EBL: 20 cc  Specimen: Right breast tissue with localizing seed and clip verified by Faxitron  Drains: None  IV fluids: Per anesthesia record  Indications for procedure: The patient presents for right breast lumpectomy secondary to an abnormal screening mammogram.  She had a roughly 1 cm area of density in the right central breast.  Core biopsy showed this to be consistent with fibrosis but this was felt to be discordant by the radiologist.  We discussed the pros and cons of lumpectomy in this circumstance.  We discussed nonoperative follow-up as well.  She opted for right breast lumpectomy after discussing all of her options.The procedure has been discussed with the patient. Alternatives to surgery have been discussed with the patient.  Risks of surgery include bleeding,  Infection,  Seroma formation, death,  and the need for further surgery.   The patient understands and wishes to proceed.  Description of procedure: The patient was met in the holding area.  Questions were answered the neoprobe was used to verify seed function and location within the right breast.  The right breast was marked as the correct side.  She was taken back to the operative room.  She was placed supine upon the operating room table.  After induction of LMA anesthesia, the right breast was prepped and draped in sterile fashion.  Timeout was done to verify proper patient, procedure and side.  The neoprobe was used and the seed was localized to the right central breast.  Curvilinear incision was made along the medial border of the nipple areolar complex.  Dissection was carried down into all tissue around both the seed and clip were excised with the assistance of the neoprobe.  Faxitron image revealed both seed and clip to be in the specimen and  this was sent to pathology.  The wound was made hemostatic with cautery.  Local anesthetic consisting of 0.25% Sensorcaine with epinephrine was infiltrated into the cavity.  The cavity was closed with 3-0 Vicryl.  4-0 Monocryl was used to close the skin in a subcuticular fashion.  Dermabond applied.  All final counts found to be correct.  The patient was awoke extubated taken to recovery in satisfactory condition.

## 2017-09-28 NOTE — Discharge Instructions (Signed)
Post Anesthesia Home Care Instructions  Activity: Get plenty of rest for the remainder of the day. A responsible individual must stay with you for 24 hours following the procedure.  For the next 24 hours, DO NOT: -Drive a car -Paediatric nurse -Drink alcoholic beverages -Take any medication unless instructed by your physician -Make any legal decisions or sign important papers.  Meals: Start with liquid foods such as gelatin or soup. Progress to regular foods as tolerated. Avoid greasy, spicy, heavy foods. If nausea and/or vomiting occur, drink only clear liquids until the nausea and/or vomiting subsides. Call your physician if vomiting continues.  Special Instructions/Symptoms: Your throat may feel dry or sore from the anesthesia or the breathing tube placed in your throat during surgery. If this causes discomfort, gargle with warm salt water. The discomfort should disappear within 24 hours.  If you had a scopolamine patch placed behind your ear for the management of post- operative nausea and/or vomiting:  1. The medication in the patch is effective for 72 hours, after which it should be removed.  Wrap patch in a tissue and discard in the trash. Wash hands thoroughly with soap and water. 2. You may remove the patch earlier than 72 hours if you experience unpleasant side effects which may include dry mouth, dizziness or visual disturbances. 3. Avoid touching the patch. Wash your hands with soap and water after contact with the patch.   [place] Office Phone Number [phone]  BREAST BIOPSY/ PARTIAL MASTECTOMY: POST OP INSTRUCTIONS  Always review your discharge instruction sheet given to you by the facility where your surgery was performed.  IF YOU HAVE DISABILITY OR FAMILY LEAVE FORMS, YOU MUST BRING THEM TO THE OFFICE FOR PROCESSING.  DO NOT GIVE THEM TO YOUR DOCTOR.  1. A prescription for pain medication may be given to you upon discharge.  Take your pain  medication as prescribed, if needed.  If narcotic pain medicine is not needed, then you may take acetaminophen (Tylenol) or ibuprofen (Advil) as needed. No Tylenol until 5:30pm! 2. Take your usually prescribed medications unless otherwise directed 3. If you need a refill on your pain medication, please contact your pharmacy.  They will contact our office to request authorization.  Prescriptions will not be filled after 5pm or on week-ends. 4. You should eat very light the first 24 hours after surgery, such as soup, crackers, pudding, etc.  Resume your normal diet the day after surgery. 5. Most patients will experience some swelling and bruising in the breast.  Ice packs and a good support bra will help.  Swelling and bruising can take several days to resolve.  6. It is common to experience some constipation if taking pain medication after surgery.  Increasing fluid intake and taking a stool softener will usually help or prevent this problem from occurring.  A mild laxative (Milk of Magnesia or Miralax) should be taken according to package directions if there are no bowel movements after 48 hours. 7. Unless discharge instructions indicate otherwise, you may remove your bandages 24-48 hours after surgery, and you may shower at that time.  You may have steri-strips (small skin tapes) in place directly over the incision.  These strips should be left on the skin for 7-10 days.  If your surgeon used skin glue on the incision, you may shower in 24 hours.  The glue will flake off over the next 2-3 weeks.  Any sutures or staples will be removed at the office during your follow-up visit.  8. ACTIVITIES:  You may resume regular daily activities (gradually increasing) beginning the next day.  Wearing a good support bra or sports bra minimizes pain and swelling.  You may have sexual intercourse when it is comfortable. a. You may drive when you no longer are taking prescription pain medication, you can comfortably wear a  seatbelt, and you can safely maneuver your car and apply brakes. b. RETURN TO WORK:  ______________________________________________________________________________________ 9. You should see your doctor in the office for a follow-up appointment approximately two weeks after your surgery.  Your doctors nurse will typically make your follow-up appointment when she calls you with your pathology report.  Expect your pathology report 2-3 business days after your surgery.  You may call to check if you do not hear from [place] after three days. 10. OTHER INSTRUCTIONS: _______________________________________________________________________________________________ _____________________________________________________________________________________________________________________________________ _____________________________________________________________________________________________________________________________________ _____________________________________________________________________________________________________________________________________  WHEN TO CALL YOUR DOCTOR: 1. Fever over 101.0 2. Nausea and/or vomiting. 3. Extreme swelling or bruising. 4. Continued bleeding from incision. 5. Increased pain, redness, or drainage from the incision.  The clinic staff is available to answer your questions during regular business hours.  Please dont hesitate to call and ask to speak to one of the nurses for clinical concerns.  If you have a medical emergency, go to the nearest emergency room or call 911.  A surgeon from [facility] is always on call at the hospital.  For further questions, please visit [email or web address]

## 2017-09-28 NOTE — Transfer of Care (Signed)
Immediate Anesthesia Transfer of Care Note  Patient: Stephanie Dudley  Procedure(s) Performed: RIGHT BREAST LUMPECTOMY WITH RADIOACTIVE SEED LOCALIZATION (Right Breast)  Patient Location: PACU  Anesthesia Type:General  Level of Consciousness: awake, alert  and oriented  Airway & Oxygen Therapy: Patient Spontanous Breathing  Post-op Assessment: Report given to RN and Post -op Vital signs reviewed and stable  Post vital signs: Reviewed and stable  Last Vitals:  Vitals Value Taken Time  BP 128/83 09/28/2017  1:00 PM  Temp    Pulse 76 09/28/2017  1:01 PM  Resp 13 09/28/2017  1:02 PM  SpO2 98 % 09/28/2017  1:01 PM  Vitals shown include unvalidated device data.  Last Pain:  Vitals:   09/28/17 1126  TempSrc: Oral         Complications: No apparent anesthesia complications

## 2017-09-28 NOTE — Anesthesia Preprocedure Evaluation (Addendum)
Anesthesia Evaluation  Patient identified by MRN, date of birth, ID band Patient awake  General Assessment Comment:Complication of anesthesia  hard to wake up with thyroid surgery   Reviewed: Allergy & Precautions, H&P , NPO status , Patient's Chart, lab work & pertinent test results, reviewed documented beta blocker date and time   History of Anesthesia Complications (+) history of anesthetic complications  Airway Mallampati: I  TM Distance: >3 FB Neck ROM: full    Dental  (+) Edentulous Upper, Upper Dentures, Partial Lower, Poor Dentition, Missing   Pulmonary former smoker,    breath sounds clear to auscultation       Cardiovascular Exercise Tolerance: Good hypertension, Pt. on medications  Rhythm:regular Rate:Normal     Neuro/Psych    GI/Hepatic   Endo/Other  Hypothyroidism   Renal/GU   negative genitourinary   Musculoskeletal  (+) Arthritis , Osteoarthritis,    Abdominal   Peds  Hematology   Anesthesia Other Findings   Reproductive/Obstetrics                            Anesthesia Physical Anesthesia Plan  ASA: II  Anesthesia Plan: General   Post-op Pain Management:    Induction: Intravenous  PONV Risk Score and Plan: 3 and Ondansetron, Dexamethasone and Treatment may vary due to age or medical condition  Airway Management Planned: LMA and Oral ETT  Additional Equipment:   Intra-op Plan:   Post-operative Plan:   Informed Consent: I have reviewed the patients History and Physical, chart, labs and discussed the procedure including the risks, benefits and alternatives for the proposed anesthesia with the patient or authorized representative who has indicated his/her understanding and acceptance.   Dental Advisory Given  Plan Discussed with: CRNA, Anesthesiologist and Surgeon  Anesthesia Plan Comments: ( )        Anesthesia Quick Evaluation

## 2017-09-28 NOTE — Anesthesia Postprocedure Evaluation (Signed)
Anesthesia Post Note  Patient: RUCHEL BRANDENBURGER  Procedure(s) Performed: RIGHT BREAST LUMPECTOMY WITH RADIOACTIVE SEED LOCALIZATION (Right Breast)     Patient location during evaluation: PACU Anesthesia Type: General Level of consciousness: awake and alert Pain management: pain level controlled Vital Signs Assessment: post-procedure vital signs reviewed and stable Respiratory status: spontaneous breathing, nonlabored ventilation, respiratory function stable and patient connected to nasal cannula oxygen Cardiovascular status: blood pressure returned to baseline and stable Postop Assessment: no apparent nausea or vomiting Anesthetic complications: no    Last Vitals:  Vitals:   09/28/17 1257 09/28/17 1300  BP: (!) 122/97 128/83  Pulse: 75 74  Resp: 16 (!) 9  Temp: (!) 36.3 C   SpO2: 98% 98%    Last Pain:  Vitals:   09/28/17 1257  TempSrc:   PainSc: 7                  Petr Bontempo

## 2017-09-29 ENCOUNTER — Encounter (HOSPITAL_BASED_OUTPATIENT_CLINIC_OR_DEPARTMENT_OTHER): Payer: Self-pay | Admitting: Surgery

## 2017-10-26 ENCOUNTER — Inpatient Hospital Stay: Payer: Federal, State, Local not specified - PPO | Attending: Genetic Counselor | Admitting: Genetics

## 2017-10-26 ENCOUNTER — Encounter: Payer: Self-pay | Admitting: Genetics

## 2017-10-26 ENCOUNTER — Inpatient Hospital Stay: Payer: Federal, State, Local not specified - PPO

## 2017-10-26 DIAGNOSIS — Z1379 Encounter for other screening for genetic and chromosomal anomalies: Secondary | ICD-10-CM

## 2017-10-26 DIAGNOSIS — Z808 Family history of malignant neoplasm of other organs or systems: Secondary | ICD-10-CM

## 2017-10-26 DIAGNOSIS — Z803 Family history of malignant neoplasm of breast: Secondary | ICD-10-CM | POA: Insufficient documentation

## 2017-10-26 DIAGNOSIS — Z8049 Family history of malignant neoplasm of other genital organs: Secondary | ICD-10-CM

## 2017-10-26 NOTE — Progress Notes (Signed)
REFERRING PROVIDER: Erroll Luna, MD [address] [place], [place] [address]  PRIMARY PROVIDER:  Patient, No Pcp Per  PRIMARY REASON FOR VISIT:  1. Family history of breast cancer   2. Family history of thyroid cancer   3. Family history of uterine cancer     HISTORY OF PRESENT ILLNESS:   Stephanie Dudley, a 62 y.o. female, was seen for a [facility] cancer genetics consultation at the request of Dr. Brantley Stage due to a family history of cancer.  Stephanie Dudley presents to clinic today to discuss the possibility of a hereditary predisposition to cancer, genetic testing, and to further clarify her future cancer risks, as well as potential cancer risks for family members.    Stephanie Dudley is a 62 y.o. female with no personal history of cancer.  She has had 2 breast lumpectomies due to benign breast lesions.  The most recent was 09/28/2017 (right lumpectomy).  Pathology on this lumpectomy revealed fibrocystic changes and no malignancy.  Stephanie Dudley reports having a lumpectomy with a similar outcome about 15 years ago as well.   HORMONAL RISK FACTORS:  Menarche was at age 49.  First live birth at age 55-25.  OCP use for approximately 8-10 years.  Ovaries intact: yes.  Hysterectomy: no.  Menopausal status: postmenopausal.  HRT use: used patch for a couple of years, stopped 10 years ago years. Colonoscopy: yes; reportedly normal. Mammogram within the last year: yes. Number of breast biopsies: 2.  Past Medical History:  Diagnosis Date  . Arthritis   . Cancer ([facility]) 08/2017   right breast cancer  . Complication of anesthesia    hard to wake up with thyroid surgery  . Family history of breast cancer   . Family history of thyroid cancer   . Family history of uterine cancer   . Hyperlipidemia   . Hypertension   . Hypothyroidism   . Mitral valve prolapse   . Peripheral edema    ankles, feet  . Thyroid disease     Past Surgical History:  Procedure Laterality Date  . APPENDECTOMY     . BREAST BIOPSY Left   . BREAST LUMPECTOMY WITH RADIOACTIVE SEED LOCALIZATION Right 09/28/2017   Procedure: RIGHT BREAST LUMPECTOMY WITH RADIOACTIVE SEED LOCALIZATION;  Surgeon: Erroll Luna, MD;  Location: [facility];  Service: General;  Laterality: Right;  . BREAST SURGERY    . KNEE ARTHROSCOPY Left   . MIDDLE EAR SURGERY    . THYROIDECTOMY    . TONSILLECTOMY    . WRIST FRACTURE SURGERY      Social History   Socioeconomic History  . Marital status: Married    Spouse name: Not on file  . Number of children: Not on file  . Years of education: Not on file  . Highest education level: Not on file  Occupational History  . Not on file  Social Needs  . Financial resource strain: Not on file  . Food insecurity:    Worry: Not on file    Inability: Not on file  . Transportation needs:    Medical: Not on file    Non-medical: Not on file  Tobacco Use  . Smoking status: Former Research scientist (life sciences)  . Smokeless tobacco: Never Used  Substance and Sexual Activity  . Alcohol use: Yes    Comment: wine 3x/wk  . Drug use: No  . Sexual activity: Yes    Birth control/protection: Post-menopausal  Lifestyle  . Physical activity:    Days per  week: Not on file    Minutes per session: Not on file  . Stress: Not on file  Relationships  . Social connections:    Talks on phone: Not on file    Gets together: Not on file    Attends religious service: Not on file    Active member of club or organization: Not on file    Attends meetings of clubs or organizations: Not on file    Relationship status: Not on file  Other Topics Concern  . Not on file  Social History Narrative  . Not on file     FAMILY HISTORY:  We obtained a detailed, 4-generation family history.  Significant diagnoses are listed below: Family History  Problem Relation Age of Onset  . Breast cancer Sister 23  . Thyroid cancer Mother   . Uterine cancer Paternal Grandmother 76   Stephanie Dudley has 4 daughters in their 73's  with no history of cancer.  She has 10 biological grandchildren. Stephanie Dudley has 3 full brothers and 3 maternal half-brothers and 1 maternal half sister.  Her half-sister was diagnosed with breast cancer at the age of 49.  She has not had any genetic testing.   Stephanie Dudley father: 24, no history of cancer.  Paternal Aunts/Uncles: 1 paternal uncle with no history of cancer.  Paternal cousins: no history of cancer.  Paternal grandfather: no history of cancer Paternal grandmother:uterine cancer in her 78's  Stephanie Dudley's mother: died at 42, had a hx of thyroid cancer (dx middle aged) Maternal Aunts/Uncles: 1 maternal uncle and 1 maternal aunt with no history of cancer.  Maternal cousins: no history of cancer.  Maternal grandfather: died in his 55's with no history of cancer Maternal grandmother:died in his 18's with no history of cancer  Stephanie Dudley is unaware of previous family history of genetic testing for hereditary cancer risks. Patient's maternal ancestors are of African american/Native American descent, and paternal ancestors are of African American descent. There is no reported Ashkenazi Jewish ancestry. There is no known consanguinity.  GENETIC COUNSELING ASSESSMENT: Stephanie Dudley is a 62 y.o. female with a family history which is somewhat suggestive of a Hereditary Cancer Predisposition Syndrome. We, therefore, discussed and recommended the following at today's visit.   DISCUSSION: We reviewed the characteristics, features and inheritance patterns of hereditary cancer syndromes. We also discussed genetic testing, including the appropriate family members to test, the process of testing, insurance coverage and turn-around-time for results. We discussed the implications of a negative, positive and/or variant of uncertain significant result. We offered Stephanie Dudley the option to pursue genetic testing for the Common Hereditary Cancers gene panel.  The Common Hereditary Cancer Panel  offered by [facility] includes sequencing and/or deletion duplication testing of the following 47 genes: APC, ATM, AXIN2, BARD1, BMPR1A, BRCA1, BRCA2, BRIP1, CDH1, CDKN2A (p14ARF), CDKN2A (p16INK4a), CKD4, CHEK2, CTNNA1, DICER1, EPCAM (Deletion/duplication testing only), GREM1 (promoter region deletion/duplication testing only), KIT, MEN1, MLH1, MSH2, MSH3, MSH6, MUTYH, NBN, NF1, NHTL1, PALB2, PDGFRA, PMS2, POLD1, POLE, PTEN, RAD50, RAD51C, RAD51D, SDHB, SDHC, SDHD, SMAD4, SMARCA4. STK11, TP53, TSC1, TSC2, and VHL.  The following genes were evaluated for sequence changes only: SDHA and HOXB13 c.251G>A variant only.   [facility] Thyroid Cancer Panel: APC CHEK2 DICER1 PRKAR1A PTEN RET TP53   We discussed that only 5-10% of cancers are associated with a Hereditary cancer predisposition syndrome.  One of the most common hereditary cancer syndromes that increases breast cancer risk is called Hereditary Breast and Ovarian Cancer (  HBOC) syndrome.  This syndrome is caused by mutations in the BRCA1 and BRCA2 genes.  This syndrome increases an individual's lifetime risk to develop breast, ovarian, pancreatic, and other types of cancer.  There are also many other cancer predisposition syndromes caused by mutations in several other genes.  We discussed that if she is found to have a mutation in one of these genes, it may impact future medical management recommendations such as increased cancer screenings and consideration of risk reducing surgeries.  A positive result could also have implications for the patient's family members.  A Negative result would mean we were unable to identify a hereditary predisposition to cancer in her, but does not rule out the possibility of a hereditary cancer risk.  There could be mutations that are undetectable by current technology, or in genes not yet tested or identified to increase cancer risk.    We discussed the potential to find a Variant of Uncertain Significance or VUS.  These are  variants that have not yet been identified as pathogenic or benign, and it is unknown if this variant is associated with increased cancer risk or if this is a normal finding.  Most VUS's are reclassified to benign or likely benign.   It should not be used to make medical management decisions. With time, we suspect the lab will determine the significance of any VUS's identified if any.   We discussed with Stephanie Dudley that the family history is not highly consistent with a familial hereditary cancer syndrome, and we feel she is at low risk to harbor a gene mutation associated with such a condition. She does not meet criteria and insurance is unlikely to cover testing, however, we did offer the option to have genetic testing and she may elect to pursue self-pay with the laboratory.   Stephanie Dudley [facility] breast cancer risk estimate is 15.0%.  High risk breast screening is recommended by ACS when this risk is 20% or higher.     PLAN: After considering the risks, benefits, and limitations, Stephanie Dudley  provided informed consent to pursue genetic testing and the blood sample was sent to [facility] for analysis of the Common Hereditary Cancers Panel + Thyroid cancer panel. Results should be available within approximately 2-3 weeks' time, at which point they will be disclosed by telephone to Stephanie Dudley, as will any additional recommendations warranted by these results. Stephanie Dudley will receive a summary of her genetic counseling visit and a copy of her results once available. This information will also be available in Epic. We encouraged Stephanie Dudley to remain in contact with cancer genetics annually so that we can continuously update the family history and inform her of any changes in cancer genetics and testing that may be of benefit for her family. Stephanie Dudley questions were answered to her satisfaction today. Our contact information was provided should additional questions or concerns  arise.  Lastly, we encouraged Ms. Keelin to remain in contact with cancer genetics annually so that we can continuously update the family history and inform her of any changes in cancer genetics and testing that may be of benefit for this family.   Ms.  Cribb questions were answered to her satisfaction today. Our contact information was provided should additional questions or concerns arise. Thank you for the referral and allowing [place] to share in the care of your patient.   Tana Felts, MS, [facility] Certified Genetic Counselor [email or web address] phone: [phone]  The patient was seen for a  total of 35 minutes in face-to-face genetic counseling. This patient was discussed with Drs. Magrinat, Lindi Adie and/or Burr Medico who agrees with the above.

## 2017-11-08 ENCOUNTER — Telehealth: Payer: Self-pay | Admitting: Genetics

## 2017-11-08 ENCOUNTER — Ambulatory Visit: Payer: Self-pay | Admitting: Genetics

## 2017-11-08 ENCOUNTER — Encounter: Payer: Self-pay | Admitting: Genetics

## 2017-11-08 DIAGNOSIS — Z8049 Family history of malignant neoplasm of other genital organs: Secondary | ICD-10-CM

## 2017-11-08 DIAGNOSIS — Z808 Family history of malignant neoplasm of other organs or systems: Secondary | ICD-10-CM

## 2017-11-08 DIAGNOSIS — Z803 Family history of malignant neoplasm of breast: Secondary | ICD-10-CM

## 2017-11-08 DIAGNOSIS — Z1379 Encounter for other screening for genetic and chromosomal anomalies: Secondary | ICD-10-CM

## 2017-11-08 NOTE — Telephone Encounter (Addendum)
Revealed negative genetic testing.  Revealed that a VUS in POLE was identified.   This normal result is reassuring and indicates that it is unlikely Stephanie Dudley's has a hereditary predisposition to cancer. It is unlikely that there is an increased risk of another cancer due to a mutation in one of these genes.  However, genetic testing is not perfect, and cannot definitively rule out a hereditary cause.  It will be important for her to keep in contact with genetics to learn if any additional testing may be needed in the future.     We discuss that anyone can still get cancer even ic there is no genetic mutation, therefore she should continue following all recommendations for cancer screening/prevention provided to her by her physcians

## 2017-11-08 NOTE — Progress Notes (Signed)
HPI:  Ms. Prell was previously seen in the [facility] clinic on 10/26/2017 due to a family history of cancer and concerns regarding a hereditary predisposition to cancer. Please refer to our prior cancer genetics clinic note for more information regarding Ms. Molony's medical, social and family histories, and our assessment and recommendations, at the time. Ms. Chancy recent genetic test results were disclosed to her, as well as recommendations warranted by these results. These results and recommendations are discussed in more detail below.  CANCER HISTORY:   No history exists.     FAMILY HISTORY:  We obtained a detailed, 4-generation family history.  Significant diagnoses are listed below: Family History  Problem Relation Age of Onset  . Breast cancer Sister 36  . Thyroid cancer Mother   . Uterine cancer Paternal Grandmother 25    Ms. Lawson has 4 daughters in their 60's with no history of cancer.  She has 10 biological grandchildren. Ms. Scheel has 3 full brothers and 3 maternal half-brothers and 1 maternal half sister.  Her half-sister was diagnosed with breast cancer at the age of 41.  She has not had any genetic testing.   Ms. Vidaurri father: 42, no history of cancer.  Paternal Aunts/Uncles: 1 paternal uncle with no history of cancer.  Paternal cousins: no history of cancer.  Paternal grandfather: no history of cancer Paternal grandmother:uterine cancer in her 60's  Ms. Dines's mother: died at 62, had a hx of thyroid cancer (dx middle aged) Maternal Aunts/Uncles: 1 maternal uncle and 1 maternal aunt with no history of cancer.  Maternal cousins: no history of cancer.  Maternal grandfather: died in his 50's with no history of cancer Maternal grandmother:died in his 60's with no history of cancer  Ms. Hamre is unaware of previous family history of genetic testing for hereditary cancer risks. Patient's maternal ancestors are of African american/Native  American descent, and paternal ancestors are of African American descent. There is no reported Ashkenazi Jewish ancestry. There is no known consanguinity.  GENETIC TEST RESULTS: Genetic testing performed through [facility]'s Common Hereditary Cancers Panel +Thyroid Cancer Panel reported out on 11/06/2017 showed no pathogenic mutations. The Common Hereditary Cancer Panel +Thyroid cancer panel offered by [facility] includes sequencing and/or deletion duplication testing of the following 49 genes: APC, ATM, AXIN2, BARD1, BMPR1A, BRCA1, BRCA2, BRIP1, CDH1, CDKN2A (p14ARF), CDKN2A (p16INK4a), CKD4, CHEK2, CTNNA1, DICER1, EPCAM (Deletion/duplication testing only), GREM1 (promoter region deletion/duplication testing only), KIT, MEN1, MLH1, MSH2, MSH3, MSH6, MUTYH, NBN, NF1, NHTL1, PALB2, PRKAR1A, PDGFRA, PMS2, POLD1, POLE, PTEN, RAD50, RAD51C, RAD51D, RET, SDHB, SDHC, SDHD, SMAD4, SMARCA4. STK11, TP53, TSC1, TSC2, and VHL.  The following genes were evaluated for sequence changes only: SDHA and HOXB13 c.251G>A variant only.  A variant of uncertain significance (VUS) in a gene called POLE was also noted. c.6323T>C (p.Val2108Ala)  The test report will be scanned into EPIC and will be located under the Molecular Pathology section of the Results Review tab. A portion of the result report is included below for reference.     We discussed with Ms. Albergo that because current genetic testing is not perfect, it is possible there may be a gene mutation in one of these genes that current testing cannot detect, but that chance is small.  We also discussed, that there could be another gene that has not yet been discovered, or that we have not yet tested, that is responsible for the cancer diagnoses in the family. It is also possible there is a hereditary  cause for the cancer in the family that Ms. Cirrincione did not inherit and therefore was not identified in her testing.  Therefore, it is important to remain in touch with cancer  genetics in the future so that we can continue to offer Ms. Zirkle the most up to date genetic testing.   Regarding the VUS in POLE: At this time, it is unknown if this variant is associated with increased cancer risk or if this is a normal finding, but most variants such as this get reclassified to being inconsequential. It should not be used to make medical management decisions. With time, we suspect the lab will determine the significance of this variant, if any. If we do learn more about it, we will try to contact Ms. Dingus to discuss it further. However, it is important to stay in touch with [place] periodically and keep the address and phone number up to date.  ADDITIONAL GENETIC TESTING: We discussed with Ms. Lacasse that her genetic testing was fairly extensive.  If there are are genes identified to increase cancer risk that can be analyzed in the future, we would be happy to discuss and coordinate this testing at that time.     CANCER SCREENING RECOMMENDATIONS: Ms. Falk test result is considered negative (normal).  This normal indicates that it is unlikely Ms. Godown has an increased risk of cancer due to a mutation in one of these genes.   While reassuring, this does not definitively rule out a hereditary predisposition to cancer. It is still possible that there could be genetic mutations that are undetectable by current technology, or genetic mutations in genes that have not been tested or identified to increase cancer risk.  Therefore, it is recommended she continue to follow the cancer management and screening guidelines provided by her oncology and primary healthcare provider. An individual's cancer risk is not determined by genetic test results alone.  Overall cancer risk assessment includes additional factors such as personal medical history, family history, etc.  These should be used to make a personalized plan for cancer prevention and surveillance.    Ms. Brihanna Devenport [facility] breast  cancer risk estimate is 15.0%.  High risk breast screening is recommended by ACS when this risk is 20% or higher.      RECOMMENDATIONS FOR FAMILY MEMBERS:  Relatives in this family might be at some increased risk of developing cancer, over the general population risk, simply due to the family history of cancer.  We recommended women in this family have a yearly mammogram beginning at age 8, or 41 years younger than the earliest onset of cancer, an annual clinical breast exam, and perform monthly breast self-exams. Women in this family should also have a gynecological exam as recommended by their primary provider. All family members should have a colonoscopy by age 49 (or as directed by their doctors).  All family members should inform their physicians about the family history of cancer so their doctors can make the most appropriate screening recommendations for them.   FOLLOW-UP: Lastly, we discussed with Ms. Boniface that cancer genetics is a rapidly advancing field and it is possible that new genetic tests will be appropriate for her and/or her family members in the future. We encouraged her to remain in contact with cancer genetics on an annual basis so we can update her personal and family histories and let her know of advances in cancer genetics that may benefit this family.   Our contact number was provided. Ms. Sinquefield questions  were answered to her satisfaction, and she knows she is welcome to call us at anytime with additional questions or concerns.   Ferol Luz, MS, [facility] Certified Genetic Counselor [email or web address]

## 2018-01-06 ENCOUNTER — Other Ambulatory Visit: Payer: Self-pay | Admitting: Internal Medicine

## 2018-01-07 ENCOUNTER — Encounter: Payer: Federal, State, Local not specified - PPO | Admitting: Internal Medicine

## 2018-01-20 DIAGNOSIS — Z1151 Encounter for screening for human papillomavirus (HPV): Secondary | ICD-10-CM | POA: Diagnosis not present

## 2018-01-20 DIAGNOSIS — Z01419 Encounter for gynecological examination (general) (routine) without abnormal findings: Secondary | ICD-10-CM | POA: Diagnosis not present

## 2018-01-20 DIAGNOSIS — Z6832 Body mass index (BMI) 32.0-32.9, adult: Secondary | ICD-10-CM | POA: Diagnosis not present

## 2018-01-28 ENCOUNTER — Ambulatory Visit: Payer: Federal, State, Local not specified - PPO | Admitting: Internal Medicine

## 2018-01-28 ENCOUNTER — Encounter: Payer: Self-pay | Admitting: Internal Medicine

## 2018-01-28 VITALS — BP 144/90 | HR 82 | Temp 98.2°F | Ht 63.75 in | Wt 190.2 lb

## 2018-01-28 DIAGNOSIS — M79671 Pain in right foot: Secondary | ICD-10-CM | POA: Diagnosis not present

## 2018-01-28 DIAGNOSIS — I10 Essential (primary) hypertension: Secondary | ICD-10-CM | POA: Diagnosis not present

## 2018-01-28 DIAGNOSIS — Z0000 Encounter for general adult medical examination without abnormal findings: Secondary | ICD-10-CM

## 2018-01-28 LAB — POCT URINALYSIS DIPSTICK
Bilirubin, UA: NEGATIVE
GLUCOSE UA: NEGATIVE
Ketones, UA: NEGATIVE
LEUKOCYTES UA: NEGATIVE
Nitrite, UA: NEGATIVE
PH UA: 6.5 (ref 5.0–8.0)
Protein, UA: NEGATIVE
RBC UA: NEGATIVE
Spec Grav, UA: 1.02 (ref 1.010–1.025)
Urobilinogen, UA: 1 E.U./dL

## 2018-01-28 LAB — POCT UA - MICROALBUMIN
Albumin/Creatinine Ratio, Urine, POC: <30
CREATININE, POC: 200 mg/dL
MICROALBUMIN (UR) POC: 10 mg/L

## 2018-01-28 NOTE — Patient Instructions (Signed)
Foot Pain Many things can cause foot pain. Some common causes are:  An injury.  A sprain.  Arthritis.  Blisters.  Bunions.  Follow these instructions at home: Pay attention to any changes in your symptoms. Take these actions to help with your discomfort:  If directed, put ice on the affected area: ? Put ice in a plastic bag. ? Place a towel between your skin and the bag. ? Leave the ice on for 15-20 minutes, 3?4 times a day for 2 days.  Take over-the-counter and prescription medicines only as told by your health care provider.  Wear comfortable, supportive shoes that fit you well. Do not wear high heels.  Do not stand or walk for long periods of time.  Do not lift a lot of weight. This can put added pressure on your feet.  Do stretches to relieve foot pain and stiffness as told by your health care provider.  Rub your foot gently.  Keep your feet clean and dry.  Contact a health care provider if:  Your pain does not get better after a few days of self-care.  Your pain gets worse.  You cannot stand on your foot. Get help right away if:  Your foot is numb or tingling.  Your foot or toes are swollen.  Your foot or toes turn white or blue.  You have warmth and redness along your foot. This information is not intended to replace advice given to you by your health care provider. Make sure you discuss any questions you have with your health care provider. Document Released: 04/05/2015 Document Revised: 08/15/2015 Document Reviewed: 04/04/2014 Elsevier Interactive Patient Education  2018 [employer]  

## 2018-01-29 ENCOUNTER — Encounter: Payer: Self-pay | Admitting: Internal Medicine

## 2018-01-29 LAB — CMP14+EGFR
A/G RATIO: 2.6 — AB (ref 1.2–2.2)
ALT: 19 IU/L (ref 0–32)
AST: 25 IU/L (ref 0–40)
Albumin: 4.6 g/dL (ref 3.6–4.8)
Alkaline Phosphatase: 84 IU/L (ref 39–117)
BUN/Creatinine Ratio: 13 (ref 12–28)
BUN: 11 mg/dL (ref 8–27)
Bilirubin Total: 0.4 mg/dL (ref 0.0–1.2)
CO2: 24 mmol/L (ref 20–29)
Calcium: 9.2 mg/dL (ref 8.7–10.3)
Chloride: 101 mmol/L (ref 96–106)
Creatinine, Ser: 0.83 mg/dL (ref 0.57–1.00)
GFR calc Af Amer: 89 mL/min/{1.73_m2} (ref >59)
GFR, EST NON AFRICAN AMERICAN: 77 mL/min/{1.73_m2} (ref >59)
Globulin, Total: 1.8 g/dL (ref 1.5–4.5)
Glucose: 87 mg/dL (ref 65–99)
POTASSIUM: 4 mmol/L (ref 3.5–5.2)
Sodium: 142 mmol/L (ref 134–144)
Total Protein: 6.4 g/dL (ref 6.0–8.5)

## 2018-01-29 LAB — HEMOGLOBIN A1C
ESTIMATED AVERAGE GLUCOSE: 111 mg/dL
Hgb A1c MFr Bld: 5.5 % (ref 4.8–5.6)

## 2018-01-29 LAB — CBC
HEMATOCRIT: 35.5 % (ref 34.0–46.6)
HEMOGLOBIN: 11.9 g/dL (ref 11.1–15.9)
MCH: 31 pg (ref 26.6–33.0)
MCHC: 33.5 g/dL (ref 31.5–35.7)
MCV: 92 fL (ref 79–97)
Platelets: 287 10*3/uL (ref 150–450)
RBC: 3.84 x10E6/uL (ref 3.77–5.28)
RDW: 12.3 % (ref 12.3–15.4)
WBC: 7.4 10*3/uL (ref 3.4–10.8)

## 2018-01-29 LAB — LIPID PANEL
Chol/HDL Ratio: 3.5 ratio (ref 0.0–4.4)
Cholesterol, Total: 206 mg/dL — ABNORMAL HIGH (ref 100–199)
HDL: 59 mg/dL (ref >39)
LDL CALC: 78 mg/dL (ref 0–99)
Triglycerides: 346 mg/dL — ABNORMAL HIGH (ref 0–149)
VLDL CHOLESTEROL CAL: 69 mg/dL — AB (ref 5–40)

## 2018-01-29 LAB — HIV ANTIBODY (ROUTINE TESTING W REFLEX): HIV Screen 4th Generation wRfx: NONREACTIVE

## 2018-01-29 LAB — TSH: TSH: 1.41 u[IU]/mL (ref 0.450–4.500)

## 2018-01-29 LAB — HEPATITIS C ANTIBODY

## 2018-01-29 LAB — T4, FREE: Free T4: 1.36 ng/dL (ref 0.82–1.77)

## 2018-01-30 ENCOUNTER — Encounter: Payer: Self-pay | Admitting: Internal Medicine

## 2018-01-30 NOTE — Progress Notes (Signed)
Subjective:     Patient ID: Stephanie Dudley , female    DOB: 10-20-1955 , 62 y.o.   MRN: [id number]   Chief Complaint  Patient presents with  . Annual Exam  . Hypertension    HPI  She is here today for a full physical examination. She is followed by GYN for her pelvic examinations.   Hypertension  This is a chronic problem. The current episode started more than 1 year ago. The problem has been gradually improving since onset. The problem is controlled. Risk factors for coronary artery disease include post-menopausal state. The current treatment provides moderate improvement. There are no compliance problems.      Past Medical History:  Diagnosis Date  . Arthritis   . Cancer ([place]) 08/2017   right breast cancer  . Complication of anesthesia    hard to wake up with thyroid surgery  . Family history of breast cancer   . Family history of thyroid cancer   . Family history of uterine cancer   . Hyperlipidemia   . Hypertension   . Hypothyroidism   . Mitral valve prolapse   . Peripheral edema    ankles, feet  . Thyroid disease      Family History  Problem Relation Age of Onset  . Breast cancer Sister 44  . Thyroid cancer Mother   . Uterine cancer Paternal Grandmother 71    No LMP recorded. Patient is postmenopausal.. Negative for: breast discharge, breast lump(s), breast pain and breast self exam. Associated symptoms include abnormal vaginal bleeding. Pertinent negatives include abnormal bleeding (hematology), anxiety, decreased libido, depression, difficulty falling sleep, dyspareunia, history of infertility, nocturia, sexual dysfunction, sleep disturbances, urinary incontinence, urinary urgency, vaginal discharge and vaginal itching. Diet regular.The patient states her exercise level is    . The patient's tobacco use is:  Social History   Tobacco Use  Smoking Status Former Smoker  Smokeless Tobacco Never Used  . She has been exposed to passive smoke. The patient's  alcohol use is:  Social History   Substance and Sexual Activity  Alcohol Use Yes   Comment: wine 3x/wk  . Additional information: Last pap 01/21/2018, next one scheduled for 2020 per patient.   Current Outpatient Medications:  .  furosemide (LASIX) 40 MG tablet, , Disp: , Rfl:  .  rosuvastatin (CRESTOR) 20 MG tablet, Take 20 mg by mouth daily., Disp: , Rfl:  .  spironolactone (ALDACTONE) 25 MG tablet, TAKE ONE TABLET BY MOUTH DAILY, Disp: 90 tablet, Rfl: 0 .  SYNTHROID 88 MCG tablet, , Disp: , Rfl:  .  valsartan (DIOVAN) 160 MG tablet, TAKE ONE TABLET BY MOUTH DAILY, Disp: 90 tablet, Rfl: 0   Allergies  Allergen Reactions  . Sulfa Antibiotics Swelling     Review of Systems  Constitutional: Negative.   HENT: Negative.   Eyes: Negative.   Respiratory: Negative.   Cardiovascular: Negative.   Gastrointestinal: Negative.   Endocrine: Negative.   Genitourinary: Negative.   Musculoskeletal: Positive for arthralgias (she c/o R foot pain. Hurts to wear certain shoes. Denies trauma/fall. ).  Skin: Negative.   Allergic/Immunologic: Negative.   Neurological: Negative.   Hematological: Negative.   Psychiatric/Behavioral: Negative.      Today's Vitals   01/28/18 1510  BP: (!) 144/90  Pulse: 82  Temp: 98.2 F (36.8 C)  TempSrc: Oral  Weight: 190 lb 3.2 oz (86.3 kg)  Height: 5' 3.75" (1.619 m)   Body mass index is 32.9 kg/m.   Objective:  Physical Exam  Constitutional: She is oriented to person, place, and time. She appears well-developed and well-nourished.  HENT:  Head: Normocephalic and atraumatic.  Right Ear: External ear normal.  Left Ear: External ear normal.  Nose: Nose normal.  Mouth/Throat: Oropharynx is clear and moist.  Eyes: Pupils are equal, round, and reactive to light. Conjunctivae and EOM are normal.  Neck: Normal range of motion. Neck supple.  Cardiovascular: Normal rate, regular rhythm, normal heart sounds and intact distal pulses.  Pulmonary/Chest:  Effort normal. Right breast exhibits no inverted nipple, no mass, no nipple discharge, no skin change and no tenderness. Left breast exhibits no inverted nipple, no mass, no nipple discharge, no skin change and no tenderness.  Abdominal: Soft. Bowel sounds are normal.  Genitourinary:  Genitourinary Comments: deferred  Musculoskeletal: Normal range of motion.       Right foot: There is deformity (bony abnormality on top of R foot).  Neurological: She is alert and oriented to person, place, and time.  Skin: Skin is warm and dry.  Psychiatric: She has a normal mood and affect.  Nursing note and vitals reviewed.       Assessment And Plan:     1. Routine general medical examination at health care facility  A full exam was performed. Importance of monthly self breast exams was discussed with the patient. Health maintenance issues discussed in full detail.  PATIENT HAS BEEN ADVISED TO GET 30-45 MINUTES REGULAR EXERCISE NO LESS THAN FOUR TO FIVE DAYS PER WEEK - BOTH WEIGHTBEARING EXERCISES AND AEROBIC ARE RECOMMENDED.  SHE IS ADVISED TO FOLLOW A HEALTHY DIET WITH AT LEAST SIX FRUITS/VEGGIES PER DAY, DECREASE INTAKE OF RED MEAT, AND TO INCREASE FISH INTAKE TO TWO DAYS PER WEEK.  MEATS/FISH SHOULD NOT BE FRIED, BAKED OR BROILED IS PREFERABLE.  I SUGGEST WEARING SPF 50 SUNSCREEN ON EXPOSED PARTS AND ESPECIALLY WHEN IN THE DIRECT SUNLIGHT FOR AN EXTENDED PERIOD OF TIME.  PLEASE AVOID FAST FOOD RESTAURANTS AND INCREASE YOUR WATER INTAKE.  - Hepatitis C antibody - CMP14+EGFR - CBC - Lipid panel - Hemoglobin A1c - HIV antibody (with reflex) - TSH - T4, Free  2. Essential hypertension, benign  Fair control. She will continue with current meds for now. She is encouraged to avoid adding salt to her foods. EKG performed earlier this year, so not performed today. She will rto in six months for re-evaluation.   - POCT Urinalysis Dipstick (81002) - POCT UA - Microalbumin  3. Right foot pain  I will  refer her to podiatry for further evaluation. Pt advised radiographic studies needed to eval bony abnormality.   Maximino Greenland, MD

## 2018-01-30 NOTE — Progress Notes (Signed)
Here are your lab results:  You are negative for hepatitis c virus.  Your liver and kidney function are normal. Your blood count is normal. Your triglycerides are quite elevated - this is partly because you ate right before your visit. This can also be due to increased intake of sugary beverages, diet drinks, processed meats  ( like deli meats) and processed foods. Please eliminate these things from your diet. I will recheck this at your next visit.   You are not prediabetic. Your thyroid function is within normal limits. You are HIV negative. Please let me know if you have any questions. Please continue with your regular exercise regimen.   Sincerely,    Laketia Vicknair N. B[facility], MD

## 2018-02-01 ENCOUNTER — Emergency Department (HOSPITAL_COMMUNITY): Payer: Federal, State, Local not specified - PPO

## 2018-02-01 ENCOUNTER — Observation Stay (HOSPITAL_COMMUNITY)
Admission: EM | Admit: 2018-02-01 | Discharge: 2018-02-02 | Disposition: A | Discharge status: Home / Self Care | Payer: Federal, State, Local not specified - PPO | Attending: Internal Medicine | Admitting: Internal Medicine

## 2018-02-01 ENCOUNTER — Encounter (HOSPITAL_COMMUNITY): Payer: Self-pay | Admitting: Emergency Medicine

## 2018-02-01 DIAGNOSIS — Z79899 Other long term (current) drug therapy: Secondary | ICD-10-CM | POA: Diagnosis not present

## 2018-02-01 DIAGNOSIS — Z853 Personal history of malignant neoplasm of breast: Secondary | ICD-10-CM | POA: Diagnosis not present

## 2018-02-01 DIAGNOSIS — R918 Other nonspecific abnormal finding of lung field: Secondary | ICD-10-CM | POA: Insufficient documentation

## 2018-02-01 DIAGNOSIS — I313 Pericardial effusion (noninflammatory): Secondary | ICD-10-CM | POA: Diagnosis not present

## 2018-02-01 DIAGNOSIS — M7989 Other specified soft tissue disorders: Secondary | ICD-10-CM | POA: Insufficient documentation

## 2018-02-01 DIAGNOSIS — I319 Disease of pericardium, unspecified: Secondary | ICD-10-CM | POA: Insufficient documentation

## 2018-02-01 DIAGNOSIS — Z87891 Personal history of nicotine dependence: Secondary | ICD-10-CM | POA: Insufficient documentation

## 2018-02-01 DIAGNOSIS — J432 Centrilobular emphysema: Secondary | ICD-10-CM | POA: Insufficient documentation

## 2018-02-01 DIAGNOSIS — E039 Hypothyroidism, unspecified: Secondary | ICD-10-CM | POA: Diagnosis not present

## 2018-02-01 DIAGNOSIS — I300 Acute nonspecific idiopathic pericarditis: Secondary | ICD-10-CM | POA: Diagnosis present

## 2018-02-01 DIAGNOSIS — C50919 Malignant neoplasm of unspecified site of unspecified female breast: Secondary | ICD-10-CM | POA: Diagnosis present

## 2018-02-01 DIAGNOSIS — Z79890 Hormone replacement therapy (postmenopausal): Secondary | ICD-10-CM | POA: Diagnosis not present

## 2018-02-01 DIAGNOSIS — E785 Hyperlipidemia, unspecified: Secondary | ICD-10-CM | POA: Insufficient documentation

## 2018-02-01 DIAGNOSIS — M25471 Effusion, right ankle: Secondary | ICD-10-CM | POA: Diagnosis not present

## 2018-02-01 DIAGNOSIS — I081 Rheumatic disorders of both mitral and tricuspid valves: Secondary | ICD-10-CM | POA: Insufficient documentation

## 2018-02-01 DIAGNOSIS — R079 Chest pain, unspecified: Principal | ICD-10-CM | POA: Insufficient documentation

## 2018-02-01 DIAGNOSIS — M25473 Effusion, unspecified ankle: Secondary | ICD-10-CM

## 2018-02-01 DIAGNOSIS — I10 Essential (primary) hypertension: Secondary | ICD-10-CM | POA: Insufficient documentation

## 2018-02-01 LAB — CBC WITH DIFFERENTIAL/PLATELET
Abs Immature Granulocytes: 0.04 10*3/uL (ref 0.00–0.07)
BASOS PCT: 0 %
Basophils Absolute: 0 10*3/uL (ref 0.0–0.1)
EOS ABS: 0 10*3/uL (ref 0.0–0.5)
Eosinophils Relative: 0 %
HEMATOCRIT: 36 % (ref 36.0–46.0)
Hemoglobin: 11.4 g/dL — ABNORMAL LOW (ref 12.0–15.0)
IMMATURE GRANULOCYTES: 0 %
LYMPHS ABS: 1.1 10*3/uL (ref 0.7–4.0)
Lymphocytes Relative: 12 %
MCH: 30.6 pg (ref 26.0–34.0)
MCHC: 31.7 g/dL (ref 30.0–36.0)
MCV: 96.8 fL (ref 80.0–100.0)
MONOS PCT: 11 %
Monocytes Absolute: 1 10*3/uL (ref 0.1–1.0)
NEUTROS PCT: 77 %
Neutro Abs: 7.1 10*3/uL (ref 1.7–7.7)
Platelets: 243 10*3/uL (ref 150–400)
RBC: 3.72 MIL/uL — ABNORMAL LOW (ref 3.87–5.11)
RDW: 12.9 % (ref 11.5–15.5)
WBC: 9.3 10*3/uL (ref 4.0–10.5)
nRBC: 0 % (ref 0.0–0.2)

## 2018-02-01 LAB — COMPREHENSIVE METABOLIC PANEL
ALBUMIN: 4.1 g/dL (ref 3.5–5.0)
ALK PHOS: 65 U/L (ref 38–126)
ALT: 16 U/L (ref 0–44)
AST: 24 U/L (ref 15–41)
Anion gap: 9 (ref 5–15)
BILIRUBIN TOTAL: 0.7 mg/dL (ref 0.3–1.2)
BUN: 13 mg/dL (ref 6–20)
CO2: 24 mmol/L (ref 22–32)
CREATININE: 1.02 mg/dL — AB (ref 0.44–1.00)
Calcium: 9.2 mg/dL (ref 8.9–10.3)
Chloride: 101 mmol/L (ref 98–111)
GFR calc Af Amer: >60 mL/min (ref >60)
GFR, EST NON AFRICAN AMERICAN: 59 mL/min — AB (ref >60)
GLUCOSE: 115 mg/dL — AB (ref 70–99)
POTASSIUM: 3.5 mmol/L (ref 3.5–5.1)
Sodium: 134 mmol/L — ABNORMAL LOW (ref 135–145)
TOTAL PROTEIN: 6.7 g/dL (ref 6.5–8.1)

## 2018-02-01 LAB — I-STAT TROPONIN, ED: Troponin i, poc: 0 ng/mL (ref 0.00–0.08)

## 2018-02-01 LAB — LIPASE, BLOOD: LIPASE: 36 U/L (ref 11–51)

## 2018-02-01 MED ORDER — IOPAMIDOL (ISOVUE-370) INJECTION 76%
100.0000 mL | Freq: Once | INTRAVENOUS | Status: AC | PRN
  Administered 2018-02-01: 54 mL via INTRAVENOUS

## 2018-02-01 MED ORDER — IOPAMIDOL (ISOVUE-370) INJECTION 76%
INTRAVENOUS | Status: AC
  Filled 2018-02-01: qty 100

## 2018-02-01 MED ORDER — MORPHINE SULFATE (PF) 4 MG/ML IV SOLN
4.0000 mg | Freq: Once | INTRAVENOUS | Status: AC
  Administered 2018-02-01: 4 mg via INTRAVENOUS
  Filled 2018-02-01: qty 1

## 2018-02-01 NOTE — ED Provider Notes (Signed)
Planes of sudden onset shortness of breath and pleuritic substernal chest pain onset 2 PM today.  Pain is worse with changing positions or taking deep breath or walking improved with remaining still.  No treatment prior to coming here no other associated symptoms.  Pain is nonradiating and sharp and pleuritic in quality.  On exam alert no distress lungs clear to auscultation heart regular rate and rhythm no murmurs or rubs chest is exquisitely tender over sternum, reproducing pain exactly.  Extremities with trace pretibial edema bilaterally extremities neurovascularly intact   Stephanie Dakin, MD 02/01/18 2132

## 2018-02-01 NOTE — ED Provider Notes (Signed)
[facility] EMERGENCY DEPARTMENT Provider Note   CSN: [id number] Arrival date & time: 02/01/18  2038     History   Chief Complaint Chief Complaint  Patient presents with  . Chest Pain    HPI Stephanie Dudley is a 62 y.o. female with past medical history of hypertension, hyperlipidemia, mitral valve prolapse, chronic peripheral edema of bilateral ankles and feet, right-sided breast cancer, who presents today for evaluation of chest pain.  At around 2 PM she was watching TV when she had sudden onset of substernal chest pain.  This does not radiate or move, she describes it as sharp.  She denies any nausea.  She did try taking Rolaids thinking this might have been heartburn however those did not help her symptoms.  She reports that she was feeling fine prior to the onset of her chest pain, has not been sick recently, no coughs, fevers.  She says that her pain is significantly worse with taking deep breaths and she becomes very winded when moving around.  She reports compliance with all of her medications. HPI  Past Medical History:  Diagnosis Date  . Ankle swelling   . Arthritis   . Cancer ([place]) 08/2017   right breast cancer  . Complication of anesthesia    hard to wake up with thyroid surgery  . Family history of breast cancer   . Family history of thyroid cancer   . Family history of uterine cancer   . Hyperlipidemia   . Hypertension   . Hypothyroidism   . Mitral valve prolapse   . Peripheral edema    ankles, feet  . Thyroid disease     Patient Active Problem List   Diagnosis Date Noted  . Breast cancer ([place]) 02/02/2018  . Chest pain 02/02/2018  . Hyperlipidemia   . Hypertension   . Hypothyroidism   . Ankle swelling   . Genetic testing 11/08/2017  . Family history of breast cancer   . Family history of thyroid cancer   . Family history of uterine cancer     Past Surgical History:  Procedure Laterality Date  . APPENDECTOMY  1994  . BREAST  BIOPSY Left 1999  . BREAST LUMPECTOMY WITH RADIOACTIVE SEED LOCALIZATION Right 09/28/2017   Procedure: RIGHT BREAST LUMPECTOMY WITH RADIOACTIVE SEED LOCALIZATION;  Surgeon: Erroll Luna, MD;  Location: [facility];  Service: General;  Laterality: Right;  . BREAST SURGERY    . KNEE ARTHROSCOPY Left   . MIDDLE EAR SURGERY Left 2011  . THYROIDECTOMY  1974   goiter--total thyroidectomy  . TONSILLECTOMY  1972  . WRIST FRACTURE SURGERY       OB History   None      Home Medications    Prior to Admission medications   Medication Sig Start Date End Date Taking? Authorizing Provider  furosemide (LASIX) 40 MG tablet Take 40 mg by mouth daily.  09/25/17  Yes [provider]  rosuvastatin (CRESTOR) 20 MG tablet Take 20 mg by mouth daily.   Yes [provider]  spironolactone (ALDACTONE) 25 MG tablet TAKE ONE TABLET BY MOUTH DAILY Patient taking differently: Take 25 mg by mouth daily.  01/07/18  Yes Glendale Chard, MD  SYNTHROID 88 MCG tablet Take 88 mcg by mouth daily.  10/01/17  Yes [provider]  valsartan (DIOVAN) 160 MG tablet TAKE ONE TABLET BY MOUTH DAILY Patient taking differently: Take 160 mg by mouth daily.  01/07/18  Yes Glendale Chard, MD  Family History Family History  Problem Relation Age of Onset  . Breast cancer Sister 29  . Thyroid cancer Mother   . Uterine cancer Paternal Grandmother 44    Social History Social History   Tobacco Use  . Smoking status: Former Research scientist (life sciences)  . Smokeless tobacco: Never Used  Substance Use Topics  . Alcohol use: Yes    Comment: wine 3x/wk  . Drug use: No     Allergies   Sulfa antibiotics   Review of Systems Review of Systems  Constitutional: Negative for chills and fever.  HENT: Negative for congestion.   Eyes: Negative for visual disturbance.  Respiratory: Positive for shortness of breath. Negative for cough, chest tightness and wheezing.   Cardiovascular: Positive for chest pain and  leg swelling (Bilateral ankles, chronic, unchanged). Negative for palpitations.  Gastrointestinal: Negative for abdominal pain, diarrhea, nausea and vomiting.  Musculoskeletal: Negative for back pain, neck pain and neck stiffness.  Skin: Negative for color change and wound.  Neurological: Negative for dizziness, syncope, weakness and numbness.  All other systems reviewed and are negative.    Physical Exam Updated Vital Signs BP 113/69 (BP Location: Left Arm)   Pulse 91   Temp 99.5 F (37.5 C) (Oral)   Resp (!) 24   SpO2 96%   Physical Exam  Constitutional: She is oriented to person, place, and time. She appears well-developed and well-nourished. No distress.  HENT:  Head: Normocephalic and atraumatic.  Eyes: Conjunctivae are normal.  Neck: Normal range of motion. Neck supple. No JVD present.  Cardiovascular: Normal rate, regular rhythm, intact distal pulses and normal pulses. Exam reveals no friction rub.  No murmur heard. Pulses:      Radial pulses are 2+ on the right side, and 2+ on the left side.       Dorsalis pedis pulses are 2+ on the right side, and 2+ on the left side.  Patient's pain is improved with sitting at an angle.  Made worse when laying flat and she is unable to breathe laying flat.   Pulmonary/Chest: Effort normal and breath sounds normal. No respiratory distress. She has no decreased breath sounds. She has no wheezes. She has no rhonchi. She has no rales.  Unable to recreate or worsen chest pain with palpation of chest wall.   Abdominal: Soft. Bowel sounds are normal. She exhibits no distension. There is no tenderness.  Musculoskeletal: She exhibits no edema.       Right lower leg: Normal. She exhibits no tenderness and no edema.       Left lower leg: Normal. She exhibits no tenderness and no edema.  Neurological: She is alert and oriented to person, place, and time. She is not disoriented.  Skin: Skin is warm and dry.  Psychiatric: She has a normal mood and  affect.  Nursing note and vitals reviewed.    ED Treatments / Results  Labs (all labs ordered are listed, but only abnormal results are displayed) Labs Reviewed  COMPREHENSIVE METABOLIC PANEL - Abnormal; Notable for the following components:      Result Value   Sodium 134 (*)    Glucose, Bld 115 (*)    Creatinine, Ser 1.02 (*)    GFR calc non Af Amer 59 (*)    All other components within normal limits  CBC WITH DIFFERENTIAL/PLATELET - Abnormal; Notable for the following components:   RBC 3.72 (*)    Hemoglobin 11.4 (*)    All other components within normal limits  LIPASE,  BLOOD  SEDIMENTATION RATE  C-REACTIVE PROTEIN  BRAIN NATRIURETIC PEPTIDE  TROPONIN I  TROPONIN I  TROPONIN I  I-STAT TROPONIN, ED  I-STAT TROPONIN, ED    EKG EKG Interpretation  Date/Time:  Tuesday February 01 2018 20:50:54 EST Ventricular Rate:  91 PR Interval:    QRS Duration: 74 QT Interval:  336 QTC Calculation: 414 R Axis:   46 Text Interpretation:  Sinus rhythm Low voltage, precordial leads No significant change since last tracing Confirmed by Orlie Dakin [id number]) on 02/01/2018 9:03:53 PM   Radiology Dg Chest 2 View  Result Date: 02/01/2018 CLINICAL DATA:  Chest pain. EXAM: CHEST - 2 VIEW COMPARISON:  March 17, 2016 FINDINGS: The cardiac silhouette appears mildly enlarged. Mild haziness lateral left lung base, likely atelectasis. No overt edema. No nodule, mass, or other infiltrate. IMPRESSION: The cardiac silhouette appears mildly enlarged, more prominent in the interval. Probable atelectasis at the lateral left lung base. Electronically Signed   By: Dorise Bullion III M.D   On: 02/01/2018 21:48   Ct Angio Chest Pe W/cm &/or Wo Cm  Result Date: 02/01/2018 CLINICAL DATA:  Chest pain at rest starting today. Non radiating with pain worsening upon breathing. EXAM: CT ANGIOGRAPHY CHEST WITH CONTRAST TECHNIQUE: Multidetector CT imaging of the chest was performed using the standard  protocol during bolus administration of intravenous contrast. Multiplanar CT image reconstructions and MIPs were obtained to evaluate the vascular anatomy. CONTRAST:  49m ISOVUE-370 IOPAMIDOL (ISOVUE-370) INJECTION 76% COMPARISON:  Same day CXR FINDINGS: Cardiovascular: Common branch point for the right brachiocephalic and left common carotid arteries, normal variant. Nonaneurysmal thoracic aorta. Satisfactory opacification of the pulmonary arteries to the segmental level without acute pulmonary embolus. Cardiomegaly with trace pericardial effusion measuring up to 6 mm thickness. Mediastinum/Nodes: No enlarged mediastinal, hilar, or axillary lymph nodes. Thyroid gland, trachea, and esophagus demonstrate no significant findings. Lungs/Pleura: Left basilar atelectasis. Faint bilateral ground-glass opacities, nonspecific but may represent stigmata of hypoventilatory change, CHF, alveolitis or pneumonitis among some considerations though not exclusive. This is superimposed upon mild scattered centrilobular emphysema, upper lobe predominant. No effusion or pneumothorax. Upper Abdomen: No acute abnormality. Musculoskeletal: No chest wall abnormality. No acute or significant osseous findings. Review of the MIP images confirms the above findings. IMPRESSION: 1. No acute pulmonary embolus. 2. Faint bilateral ground-glass opacities, nonspecific but may represent stigmata of hypoventilatory change, CHF, alveolitis or pneumonitis among some considerations though not exclusive. 3. Mild centrilobular emphysema. Emphysema (ICD10-J43.9). Electronically Signed   By: DAshley RoyaltyM.D.   On: 02/01/2018 23:42    Procedures Procedures (including critical care time)  Medications Ordered in ED Medications  iopamidol (ISOVUE-370) 76 % injection (has no administration in time range)  morphine 2 MG/ML injection 2 mg (has no administration in time range)  furosemide (LASIX) tablet 40 mg (has no administration in time range)    rosuvastatin (CRESTOR) tablet 20 mg (has no administration in time range)  spironolactone (ALDACTONE) tablet 25 mg (has no administration in time range)  irbesartan (AVAPRO) tablet 150 mg (has no administration in time range)  levothyroxine (SYNTHROID, LEVOTHROID) tablet 88 mcg (has no administration in time range)  oxyCODONE-acetaminophen (PERCOCET/ROXICET) 5-325 MG per tablet 1 tablet (has no administration in time range)  morphine 2 MG/ML injection 2 mg (has no administration in time range)  acetaminophen (TYLENOL) tablet 650 mg (has no administration in time range)  ondansetron (ZOFRAN) injection 4 mg (has no administration in time range)  enoxaparin (LOVENOX) injection 40 mg (has no administration in  time range)  zolpidem (AMBIEN) tablet 5 mg (has no administration in time range)  ALPRAZolam (XANAX) tablet 0.25 mg (has no administration in time range)  hydrALAZINE (APRESOLINE) injection 5 mg (has no administration in time range)  morphine 4 MG/ML injection 4 mg (4 mg Intravenous Given 02/01/18 2110)  iopamidol (ISOVUE-370) 76 % injection 100 mL (54 mLs Intravenous Contrast Given 02/01/18 2259)     Initial Impression / Assessment and Plan / ED Course  I have reviewed the triage vital signs and the nursing notes.  Pertinent labs & imaging results that were available during my care of the patient were reviewed by me and considered in my medical decision making (see chart for details).  Clinical Course as of Feb 03 128  Tue Feb 01, 2018  2239 Patient updated on results.  Plan to obtain Ctangio PE study.    [EH]  Wed Feb 02, 2018  0015 Spoke with cardiology who will come see patient.     [EH]  3976 Dr. Winfield Cunas saw the patient, recommend ESR, CRP, admission for echo.    [EH]  0052 Patient is aware of plan to admit.    [EH]  7341 Spoke with Dr. Blaine Hamper who will admit patient.    [EH]    Clinical Course User Index [EH] Lorin Glass, PA-C   Patient presents today for  evaluation of sudden onset of substernal chest pain that began while she was watching TV at approximately 2 PM.  EKG was obtained without evidence of acute abnormality.  Upon arrival she was tachycardic, tachypneic with pleuritic chest pain causing high suspicion for PE.  Chest x-ray showed concern for atelectasis.  CT PE study was performed showing no large PEs however she does have a 6 mm pericardial effusion.  Given this in the setting of chest pain cardiology was consulted.  Patient does not have the classic diffuse ST elevation of peri-/myocarditis, however her physical exam and history is consistent.  Per cardiology Dr. Marletta Lor plan is to obtain ESR, CRP and admit overnight for an echo in the morning.  This patient was seen as a shared visit with Dr. Winfred Leeds who evaluated the patient.  I spoke with Dr. Blaine Hamper from [facility] who agreed to admit the patient.   Final Clinical Impressions(s) / ED Diagnoses   Final diagnoses:  Hyperlipidemia, unspecified hyperlipidemia type  Essential hypertension  Hypothyroidism, unspecified type  Ankle swelling, unspecified laterality    ED Discharge Orders    None       Lorin Glass, PA-C 02/02/18 0131    Orlie Dakin, MD 02/02/18 [phone]

## 2018-02-01 NOTE — ED Triage Notes (Signed)
Pt reports sharp central CP 10/10 no radiation starting today. Pt was at rest when pain started. Pt reports pain worsens with breathing. Pt denies N/V.

## 2018-02-01 NOTE — ED Notes (Signed)
Patient transported to CT 

## 2018-02-02 ENCOUNTER — Encounter (HOSPITAL_COMMUNITY): Payer: Self-pay | Admitting: Internal Medicine

## 2018-02-02 ENCOUNTER — Other Ambulatory Visit: Payer: Self-pay

## 2018-02-02 ENCOUNTER — Observation Stay (HOSPITAL_BASED_OUTPATIENT_CLINIC_OR_DEPARTMENT_OTHER): Payer: Federal, State, Local not specified - PPO

## 2018-02-02 DIAGNOSIS — E039 Hypothyroidism, unspecified: Secondary | ICD-10-CM

## 2018-02-02 DIAGNOSIS — C50919 Malignant neoplasm of unspecified site of unspecified female breast: Secondary | ICD-10-CM | POA: Diagnosis present

## 2018-02-02 DIAGNOSIS — E785 Hyperlipidemia, unspecified: Secondary | ICD-10-CM | POA: Diagnosis not present

## 2018-02-02 DIAGNOSIS — M25473 Effusion, unspecified ankle: Secondary | ICD-10-CM

## 2018-02-02 DIAGNOSIS — R079 Chest pain, unspecified: Secondary | ICD-10-CM

## 2018-02-02 DIAGNOSIS — I300 Acute nonspecific idiopathic pericarditis: Secondary | ICD-10-CM

## 2018-02-02 DIAGNOSIS — I10 Essential (primary) hypertension: Secondary | ICD-10-CM | POA: Diagnosis not present

## 2018-02-02 LAB — TROPONIN I
Troponin I: <0.03 ng/mL (ref <0.03)
Troponin I: <0.03 ng/mL (ref <0.03)

## 2018-02-02 LAB — BRAIN NATRIURETIC PEPTIDE: B Natriuretic Peptide: 18.8 pg/mL (ref 0.0–100.0)

## 2018-02-02 LAB — SEDIMENTATION RATE: Sed Rate: 22 mm/hr (ref 0–22)

## 2018-02-02 LAB — I-STAT TROPONIN, ED: Troponin i, poc: 0 ng/mL (ref 0.00–0.08)

## 2018-02-02 LAB — C-REACTIVE PROTEIN: CRP: 3 mg/dL — AB (ref <1.0)

## 2018-02-02 MED ORDER — ONDANSETRON HCL 4 MG/2ML IJ SOLN: 4.0000 mg | Freq: Four times a day (QID) | INTRAMUSCULAR | Status: DC | PRN

## 2018-02-02 MED ORDER — ACETAMINOPHEN 325 MG PO TABS
650.0000 mg | ORAL_TABLET | ORAL | Status: DC | PRN
  Filled 2018-02-02: qty 2

## 2018-02-02 MED ORDER — IBUPROFEN 600 MG PO TABS
600.0000 mg | ORAL_TABLET | Freq: Three times a day (TID) | ORAL | Status: DC
  Administered 2018-02-02 (×2): 600 mg via ORAL
  Filled 2018-02-02: qty 1

## 2018-02-02 MED ORDER — FUROSEMIDE 20 MG PO TABS: 40.0000 mg | ORAL_TABLET | Freq: Every day | ORAL | Status: DC

## 2018-02-02 MED ORDER — ENOXAPARIN SODIUM 40 MG/0.4ML ~~LOC~~ SOLN: 40.0000 mg | Freq: Every day | SUBCUTANEOUS | Status: DC

## 2018-02-02 MED ORDER — ENOXAPARIN SODIUM 40 MG/0.4ML ~~LOC~~ SOLN: 40.0000 mg | SUBCUTANEOUS | Status: DC

## 2018-02-02 MED ORDER — ZOLPIDEM TARTRATE 5 MG PO TABS: 5.0000 mg | ORAL_TABLET | Freq: Every evening | ORAL | Status: DC | PRN

## 2018-02-02 MED ORDER — PANTOPRAZOLE SODIUM 40 MG PO TBEC
40.0000 mg | DELAYED_RELEASE_TABLET | Freq: Every day | ORAL | Status: DC
  Administered 2018-02-02 (×2): 40 mg via ORAL
  Filled 2018-02-02 (×2): qty 1

## 2018-02-02 MED ORDER — SPIRONOLACTONE 25 MG PO TABS: 25.0000 mg | ORAL_TABLET | Freq: Every day | ORAL | Status: DC

## 2018-02-02 MED ORDER — ROSUVASTATIN CALCIUM 20 MG PO TABS
20.0000 mg | ORAL_TABLET | Freq: Every day | ORAL | Status: DC
  Administered 2018-02-02: 20 mg via ORAL
  Filled 2018-02-02: qty 1

## 2018-02-02 MED ORDER — MORPHINE SULFATE (PF) 2 MG/ML IV SOLN: 2.0000 mg | INTRAVENOUS | Status: DC | PRN

## 2018-02-02 MED ORDER — IBUPROFEN 600 MG PO TABS: 600.0000 mg | ORAL_TABLET | Freq: Two times a day (BID) | ORAL | 0 refills | Status: AC

## 2018-02-02 MED ORDER — ALPRAZOLAM 0.25 MG PO TABS: 0.2500 mg | ORAL_TABLET | Freq: Two times a day (BID) | ORAL | Status: DC | PRN

## 2018-02-02 MED ORDER — HYDRALAZINE HCL 20 MG/ML IJ SOLN: 5.0000 mg | INTRAMUSCULAR | Status: DC | PRN

## 2018-02-02 MED ORDER — HYDROMORPHONE HCL 1 MG/ML IJ SOLN: 1.0000 mg | INTRAMUSCULAR | Status: DC | PRN

## 2018-02-02 MED ORDER — LEVOTHYROXINE SODIUM 88 MCG PO TABS
88.0000 ug | ORAL_TABLET | Freq: Every day | ORAL | Status: DC
  Administered 2018-02-02: 88 ug via ORAL
  Filled 2018-02-02: qty 1

## 2018-02-02 MED ORDER — PANTOPRAZOLE SODIUM 40 MG PO TBEC: 40.0000 mg | DELAYED_RELEASE_TABLET | Freq: Every day | ORAL | 0 refills | Status: DC

## 2018-02-02 MED ORDER — IRBESARTAN 150 MG PO TABS: 150.0000 mg | ORAL_TABLET | Freq: Every day | ORAL | Status: DC

## 2018-02-02 MED ORDER — OXYCODONE-ACETAMINOPHEN 5-325 MG PO TABS
1.0000 | ORAL_TABLET | ORAL | Status: DC | PRN
  Administered 2018-02-02: 1 via ORAL
  Filled 2018-02-02: qty 1

## 2018-02-02 MED ORDER — SODIUM CHLORIDE 0.9 % IV BOLUS
500.0000 mL | Freq: Once | INTRAVENOUS | Status: AC
  Administered 2018-02-02: 500 mL via INTRAVENOUS

## 2018-02-02 MED ORDER — MORPHINE SULFATE (PF) 2 MG/ML IV SOLN
2.0000 mg | Freq: Once | INTRAVENOUS | Status: AC
  Administered 2018-02-02: 2 mg via INTRAVENOUS
  Filled 2018-02-02: qty 1

## 2018-02-02 MED ORDER — COLCHICINE 0.6 MG PO TABS: 0.6000 mg | ORAL_TABLET | Freq: Two times a day (BID) | ORAL | 1 refills | Status: DC

## 2018-02-02 MED ORDER — COLCHICINE 0.6 MG PO TABS
0.6000 mg | ORAL_TABLET | Freq: Two times a day (BID) | ORAL | Status: DC
  Administered 2018-02-02 (×2): 0.6 mg via ORAL
  Filled 2018-02-02 (×2): qty 1

## 2018-02-02 NOTE — Progress Notes (Signed)
Stephanie Dudley stated pt okay for discharge   Pt discharged via wheelchair with NT

## 2018-02-02 NOTE — H&P (Signed)
History and Physical    Stephanie Dudley WUG:[id number] DOB: March 05, 1956 DOA: 02/01/2018  Referring MD/NP/PA:   PCP: Glendale Chard, MD   Patient coming from:  The patient is coming from home.  At baseline, pt is independent for most of ADL.        Chief Complaint: Chest pain  HPI: Stephanie Dudley is a 62 y.o. female with medical history significant of hypertension, hyperlipidemia, hypothyroidism, MVP, breast cancer (s/p of right lumpectomy with radioactive seed), chronic ankle edema, who presents with chest pain.  Patient states that her chest pain started early today, which is located in the substernal area, constant, sharp, 10 out of 10 severity, nonradiating.  It is aggravated by deep breath and movement.  No cough.  Patient has chills, no fever.  Denies nausea, vomiting, diarrhea, abdominal pain, symptoms for UTI or unilateral weakness.  Patient states that she is taking diuretics (Lasix and spironolactone) for chronic ankle edema, and eyelid swelling.  She denies history of CHF.  ED Course: pt was found to have WBC 9.3, troponin negative, creatinine 1.02, BUN 13, temperature 99.6, tachycardia, tachypnea, oxygen saturation 96% on room air.  Chest x-ray negative.  CT angiogram of chest is negative for PE, but showed 6 mm pericardial effusion, faint bilateral groundglass opacity.  Patient is placed on telemetry bed for observation.  Cardiology, Dr. Marletta Lor was consulted.  Review of Systems:   General: no fevers, has chills, no body weight gain, has fatigue HEENT: no blurry vision, hearing changes or sore throat Respiratory: no dyspnea, coughing, wheezing CV: has chest pain, no palpitations GI: no nausea, vomiting, abdominal pain, diarrhea, constipation GU: no dysuria, burning on urination, increased urinary frequency, hematuria  Ext: has ankle edema Neuro: no unilateral weakness, numbness, or tingling, no vision change or hearing loss Skin: no rash, no skin tear. MSK: No muscle  spasm, no deformity, no limitation of range of movement in spin Heme: No easy bruising.  Travel history: No recent long distant travel.  Allergy:  Allergies  Allergen Reactions  . Sulfa Antibiotics Swelling    Past Medical History:  Diagnosis Date  . Ankle swelling   . Arthritis   . Cancer ([place]) 08/2017   right breast cancer  . Complication of anesthesia    hard to wake up with thyroid surgery  . Family history of breast cancer   . Family history of thyroid cancer   . Family history of uterine cancer   . Hyperlipidemia   . Hypertension   . Hypothyroidism   . Mitral valve prolapse   . Peripheral edema    ankles, feet  . Thyroid disease     Past Surgical History:  Procedure Laterality Date  . APPENDECTOMY  1994  . BREAST BIOPSY Left 1999  . BREAST LUMPECTOMY WITH RADIOACTIVE SEED LOCALIZATION Right 09/28/2017   Procedure: RIGHT BREAST LUMPECTOMY WITH RADIOACTIVE SEED LOCALIZATION;  Surgeon: Erroll Luna, MD;  Location: [place];  Service: General;  Laterality: Right;  . BREAST SURGERY    . KNEE ARTHROSCOPY Left   . MIDDLE EAR SURGERY Left 2011  . THYROIDECTOMY  1974   goiter--total thyroidectomy  . TONSILLECTOMY  1972  . WRIST FRACTURE SURGERY      Social History:  reports that she has quit smoking. She has never used smokeless tobacco. She reports that she drinks alcohol. She reports that she does not use drugs.  Family History:  Family History  Problem Relation Age of Onset  . Breast cancer Sister  42  . Thyroid cancer Mother   . Uterine cancer Paternal Grandmother 52     Prior to Admission medications   Medication Sig Start Date End Date Taking? Authorizing Provider  furosemide (LASIX) 40 MG tablet Take 40 mg by mouth daily.  09/25/17  Yes [provider]  rosuvastatin (CRESTOR) 20 MG tablet Take 20 mg by mouth daily.   Yes [provider]  spironolactone (ALDACTONE) 25 MG tablet TAKE ONE TABLET BY MOUTH DAILY Patient  taking differently: Take 25 mg by mouth daily.  01/07/18  Yes Glendale Chard, MD  SYNTHROID 88 MCG tablet Take 88 mcg by mouth daily.  10/01/17  Yes [provider]  valsartan (DIOVAN) 160 MG tablet TAKE ONE TABLET BY MOUTH DAILY Patient taking differently: Take 160 mg by mouth daily.  01/07/18  Yes Glendale Chard, MD    Physical Exam: Vitals:   02/01/18 2048 02/01/18 2100 02/01/18 2244  BP: 137/82 126/77 113/69  Pulse: (!) 102 99 91  Resp: (!) 26 (!) 21 (!) 24  Temp: 99.6 F (37.6 C)  99.5 F (37.5 C)  TempSrc: Oral  Oral  SpO2: 98% 99% 96%   General: Not in acute distress HEENT:       Eyes: PERRL, EOMI, no scleral icterus.       ENT: No discharge from the ears and nose, no pharynx injection, no tonsillar enlargement.        Neck: No JVD, no bruit, no mass felt. Heme: No neck lymph node enlargement. Cardiac: Z2/C8, RRR, 1/6 systolic murmurs, No gallops or rubs. Respiratory:  No rales, wheezing, rhonchi or rubs. GI: Soft, nondistended, nontender, no rebound pain, no organomegaly, BS present. GU: No hematuria Ext: has mild ankle edema bilaterally. 2+DP/PT pulse bilaterally. Musculoskeletal: No joint deformities, No joint redness or warmth, no limitation of ROM in spin. Skin: No rashes.  Neuro: Alert, oriented X3, cranial nerves II-XII grossly intact, moves all extremities normally. Psych: Patient is not psychotic, no suicidal or hemocidal ideation.  Labs on Admission: I have personally reviewed following labs and imaging studies  CBC: Recent Labs  Lab 01/28/18 1600 02/01/18 2110  WBC 7.4 9.3  NEUTROABS  --  7.1  HGB 11.9 11.4*  HCT 35.5 36.0  MCV 92 96.8  PLT 287 022   Basic Metabolic Panel: Recent Labs  Lab 01/28/18 1600 02/01/18 2110  NA 142 134*  K 4.0 3.5  CL 101 101  CO2 24 24  GLUCOSE 87 115*  BUN 11 13  CREATININE 0.83 1.02*  CALCIUM 9.2 9.2   GFR: Estimated Creatinine Clearance: 62 mL/min (A) (by C-G formula based on SCr of 1.02 mg/dL  (H)). Liver Function Tests: Recent Labs  Lab 01/28/18 1600 02/01/18 2110  AST 25 24  ALT 19 16  ALKPHOS 84 65  BILITOT 0.4 0.7  PROT 6.4 6.7  ALBUMIN 4.6 4.1   Recent Labs  Lab 02/01/18 2110  LIPASE 36   No results for input(s): AMMONIA in the last 168 hours. Coagulation Profile: No results for input(s): INR, PROTIME in the last 168 hours. Cardiac Enzymes: No results for input(s): CKTOTAL, CKMB, CKMBINDEX, TROPONINI in the last 168 hours. BNP (last 3 results) No results for input(s): PROBNP in the last 8760 hours. HbA1C: No results for input(s): HGBA1C in the last 72 hours. CBG: No results for input(s): GLUCAP in the last 168 hours. Lipid Profile: No results for input(s): CHOL, HDL, LDLCALC, TRIG, CHOLHDL, LDLDIRECT in the last 72 hours. Thyroid Function Tests: No  results for input(s): TSH, T4TOTAL, FREET4, T3FREE, THYROIDAB in the last 72 hours. Anemia Panel: No results for input(s): VITAMINB12, FOLATE, FERRITIN, TIBC, IRON, RETICCTPCT in the last 72 hours. Urine analysis:    Component Value Date/Time   BILIRUBINUR Negative 01/28/2018 1638   PROTEINUR Negative 01/28/2018 1638   UROBILINOGEN 1.0 01/28/2018 1638   NITRITE Negative 01/28/2018 1638   LEUKOCYTESUR Negative 01/28/2018 1638   Sepsis Labs: _0 (procalcitonin:4,lacticidven:4) )No results found for this or any previous visit (from the past 240 hour(s)).   Radiological Exams on Admission: Dg Chest 2 View  Result Date: 02/01/2018 CLINICAL DATA:  Chest pain. EXAM: CHEST - 2 VIEW COMPARISON:  March 17, 2016 FINDINGS: The cardiac silhouette appears mildly enlarged. Mild haziness lateral left lung base, likely atelectasis. No overt edema. No nodule, mass, or other infiltrate. IMPRESSION: The cardiac silhouette appears mildly enlarged, more prominent in the interval. Probable atelectasis at the lateral left lung base. Electronically Signed   By: Dorise Bullion III M.D   On: 02/01/2018 21:48   Ct Angio  Chest Pe W/cm &/or Wo Cm  Result Date: 02/01/2018 CLINICAL DATA:  Chest pain at rest starting today. Non radiating with pain worsening upon breathing. EXAM: CT ANGIOGRAPHY CHEST WITH CONTRAST TECHNIQUE: Multidetector CT imaging of the chest was performed using the standard protocol during bolus administration of intravenous contrast. Multiplanar CT image reconstructions and MIPs were obtained to evaluate the vascular anatomy. CONTRAST:  83m ISOVUE-370 IOPAMIDOL (ISOVUE-370) INJECTION 76% COMPARISON:  Same day CXR FINDINGS: Cardiovascular: Common branch point for the right brachiocephalic and left common carotid arteries, normal variant. Nonaneurysmal thoracic aorta. Satisfactory opacification of the pulmonary arteries to the segmental level without acute pulmonary embolus. Cardiomegaly with trace pericardial effusion measuring up to 6 mm thickness. Mediastinum/Nodes: No enlarged mediastinal, hilar, or axillary lymph nodes. Thyroid gland, trachea, and esophagus demonstrate no significant findings. Lungs/Pleura: Left basilar atelectasis. Faint bilateral ground-glass opacities, nonspecific but may represent stigmata of hypoventilatory change, CHF, alveolitis or pneumonitis among some considerations though not exclusive. This is superimposed upon mild scattered centrilobular emphysema, upper lobe predominant. No effusion or pneumothorax. Upper Abdomen: No acute abnormality. Musculoskeletal: No chest wall abnormality. No acute or significant osseous findings. Review of the MIP images confirms the above findings. IMPRESSION: 1. No acute pulmonary embolus. 2. Faint bilateral ground-glass opacities, nonspecific but may represent stigmata of hypoventilatory change, CHF, alveolitis or pneumonitis among some considerations though not exclusive. 3. Mild centrilobular emphysema. Emphysema (ICD10-J43.9). Electronically Signed   By: DAshley RoyaltyM.D.   On: 02/01/2018 23:42     EKG: Independently reviewed.  Sinus rhythm, QTC  414, PR segment elevation in aVR, low voltage, small Q waves in lead III, nonspecific T wave change.   Assessment/Plan Principal Problem:   Chest pain Active Problems:   Hyperlipidemia   Hypertension   Hypothyroidism   Breast cancer (HCC)   Ankle swelling   Pericarditis   Chest pain due to possible pericarditis: Patient has typical pleuritic sharp chest pain, CT angiogram is negative for PE, but showed pericardial effusion.  EKG has PR segment elevation in aVR, consistent with pericarditis.  Troponin negative x2 in ED.  Cardiology, Dr. TMarletta Lorwas consulted.  -will place on tele be for obs -Colchicine 0.662mBID plus Ibuprofen 600 mg TID -Start protonix 40 mg daily -2d echo -ESR/CRP -Cycle trops to assess for myopericarditis -f/u card's recommendation  Hyperlipidemia: -crestor  HTN:  -Irbesartan -Her Lasix and spironolactone which is for ankle edema -IV hydralazine prn  Addendum: RN report  that pt's Bp becomes soft after received IV morphine, 88/52 -will hold Lasix, spironolactone, irbesartan -IV fluid bolus, 500 cc now, recheck blood pressure  Hypothyroidism: Last TSH was 1.41on on 01/28/18 -Continue home Synthroid  Breast cancer [facility]): s/p of right lumpectomy with radioactive seed 08/2017. No chemotherapy. Pt states that she is not following with oncology. -may need to f/u with oncology  Ankle swelling: pt is taking spironolactone and Lasix.  Denies history of congestive heart failure.  No chest pain or shortness of breath. -Continue home Lasix and spironolactone -Check BNP   DVT ppx: SQ Lovenox Code Status: Full code Family Communication:   Yes, patient's  husband at bed side Disposition Plan:  Anticipate discharge back to previous home environment Consults called:  Card, Dr. Marletta Lor Admission status: Obs / tele     Date of Service 02/02/2018    Ivor Costa [facility] Pager [phone]  If 7PM-7AM, please contact night-coverage [email or web address] Password  [email or web address] 02/02/2018, 1:37 AM

## 2018-02-02 NOTE — ED Notes (Signed)
Per care handoff from [facility], she attempted to give report to [place] where patient was assigned an inpatient bed, she advised that patient still has chest pain and 3E cannot accept patient to the floor until she is chest pain free.

## 2018-02-02 NOTE — Progress Notes (Signed)
   02/02/18 0527  Vitals  Temp 99.2 F (37.3 C)  Temp Source Oral  BP 98/66  MAP (mmHg) 76  BP Location Left Arm  BP Method Automatic  Patient Position (if appropriate) Lying  Pulse Rate 81  Resp 20  Oxygen Therapy  SpO2 97 %  O2 Device Room Air  Height and Weight  Height 5\' 5"  (1.651 m)  Weight 86.8 kg  Type of Scale Used Standing (scale c)  Type of Weight Actual  BSA (Calculated - sq m) 1.99 sq meters  BMI (Calculated) 31.83  Weight in (lb) to have BMI = 25 149.9  Admitted pt to rm 3E14 from ED, pt alert and oriented, denied pain at this time, oriented to room, call bell placed within reach, placed on cardiac monitor, CCMD made aware.

## 2018-02-02 NOTE — Progress Notes (Signed)
Patient admitted by Dr Blaine Hamper.   Patient admitted to the hospital for pleuritic chest pain.  CT of the chest was negative for pulmonary embolism but showed cardiomegaly and trace pericardial effusion.  Troponins remain negative.  ESR was slightly elevated.  Echocardiogram ordered but there was suspicion for pericarditis therefore started on ibuprofen and colchicine. This morning patient symptoms are much better and does not have any new complaints.  [facility] cardiology input.  Will start the patient on ibuprofen twice daily and colchicine twice daily.  PPI will be added.  Will need to follow-up outpatient with cardiology.  Discharge today once echo is relatively normal.  Call with questions if needed.

## 2018-02-02 NOTE — Progress Notes (Signed)
PA Rosita Fire stated to keep pt until 2D echo read  Pt and CN aware and understanding

## 2018-02-02 NOTE — Care Management (Signed)
#    3.  S/W  MARQUELL @ [facility] # [phone]   COLCHICINE  0.6 MG BID COVER- YES CO-PAY- $ 7.50  TIER- 1 DRUG PRIOR APPROVAL- NO  PREFERRED PHARMACY : YES [facility]

## 2018-02-02 NOTE — Consult Note (Signed)
Cardiology Consultation:   Patient ID: Stephanie Dudley MRN: [id number]; DOB: [birth year]  Admit date: 02/01/2018 Date of Consult: 02/02/2018  Primary Care Provider: Glendale Chard, MD Primary Cardiologist: No primary care provider on file.  Primary Electrophysiologist:  None    Patient Profile:   Stephanie Dudley is a 62 y.o. female with HTN, HLD, breast ca s/p R lumpectomy with radioactive seed, who presents with chest pain concerning for pericarditis.   History of Present Illness:   Stephanie Dudley was in her usual state of health - indepdent, active - until this afternoon when she developed the sudden onset of 10/10 substernal, pleuritic chest pain. She has never experienced similar pain before. It was somewhat alleviated by leaning forward and worsened in the supine position. She felt as thought she couldn't take a deep breath due to the pain. She trialed TUMS and other acid supressants but could not alleviate the pain, and ultimately decided to present to the ED. Associated with the pain she denies diaphoresis, N/V, palpitations. She denies antecedent URI or sick contacts.   Upon arrival, she was normotensive, not tachycardic. Initial EKG was SR with subtle PR depressions in the inferior leads. Her troponin was negative x 2. Other labs were notable only for a mild AKI (Cr 1.0 from 0.8). A CT angiogram was negative for PE but did demonstrate a trivial pericardial effusion.   Cardiology was subsequently consulted for further management.   Past Medical History:  Diagnosis Date  . Ankle swelling   . Arthritis   . Cancer ([place]) 08/2017   right breast cancer  . Complication of anesthesia    hard to wake up with thyroid surgery  . Family history of breast cancer   . Family history of thyroid cancer   . Family history of uterine cancer   . Hyperlipidemia   . Hypertension   . Hypothyroidism   . Mitral valve prolapse   . Peripheral edema    ankles, feet  . Thyroid disease      Past Surgical History:  Procedure Laterality Date  . APPENDECTOMY  1994  . BREAST BIOPSY Left 1999  . BREAST LUMPECTOMY WITH RADIOACTIVE SEED LOCALIZATION Right 09/28/2017   Procedure: RIGHT BREAST LUMPECTOMY WITH RADIOACTIVE SEED LOCALIZATION;  Surgeon: Erroll Luna, MD;  Location: [facility];  Service: General;  Laterality: Right;  . BREAST SURGERY    . KNEE ARTHROSCOPY Left   . MIDDLE EAR SURGERY Left 2011  . THYROIDECTOMY  1974   goiter--total thyroidectomy  . TONSILLECTOMY  1972  . WRIST FRACTURE SURGERY       Home Medications:  Prior to Admission medications   Medication Sig Start Date End Date Taking? Authorizing Provider  furosemide (LASIX) 40 MG tablet Take 40 mg by mouth daily.  09/25/17  Yes [provider]  rosuvastatin (CRESTOR) 20 MG tablet Take 20 mg by mouth daily.   Yes [provider]  spironolactone (ALDACTONE) 25 MG tablet TAKE ONE TABLET BY MOUTH DAILY Patient taking differently: Take 25 mg by mouth daily.  01/07/18  Yes Glendale Chard, MD  SYNTHROID 88 MCG tablet Take 88 mcg by mouth daily.  10/01/17  Yes [provider]  valsartan (DIOVAN) 160 MG tablet TAKE ONE TABLET BY MOUTH DAILY Patient taking differently: Take 160 mg by mouth daily.  01/07/18  Yes Glendale Chard, MD    Inpatient Medications: Scheduled Meds: . enoxaparin (LOVENOX) injection  40 mg Subcutaneous Q24H  . furosemide  40 mg Oral Daily  .  irbesartan  150 mg Oral Daily  . levothyroxine  88 mcg Oral Daily  . rosuvastatin  20 mg Oral Daily  . spironolactone  25 mg Oral Daily   Continuous Infusions:  PRN Meds: acetaminophen, ALPRAZolam, hydrALAZINE, HYDROmorphone (DILAUDID) injection, ondansetron (ZOFRAN) IV, oxyCODONE-acetaminophen, zolpidem  Allergies:    Allergies  Allergen Reactions  . Sulfa Antibiotics Swelling    Social History:   Social History   Socioeconomic History  . Marital status: Married    Spouse name: Not on file  .  Number of children: Not on file  . Years of education: Not on file  . Highest education level: Not on file  Occupational History  . Not on file  Social Needs  . Financial resource strain: Not on file  . Food insecurity:    Worry: Not on file    Inability: Not on file  . Transportation needs:    Medical: Not on file    Non-medical: Not on file  Tobacco Use  . Smoking status: Former Research scientist (life sciences)  . Smokeless tobacco: Never Used  Substance and Sexual Activity  . Alcohol use: Yes    Comment: wine 3x/wk  . Drug use: No  . Sexual activity: Yes    Birth control/protection: Post-menopausal  Lifestyle  . Physical activity:    Days per week: Not on file    Minutes per session: Not on file  . Stress: Not on file  Relationships  . Social connections:    Talks on phone: Not on file    Gets together: Not on file    Attends religious service: Not on file    Active member of club or organization: Not on file    Attends meetings of clubs or organizations: Not on file    Relationship status: Not on file  . Intimate partner violence:    Fear of current or ex partner: Not on file    Emotionally abused: Not on file    Physically abused: Not on file    Forced sexual activity: Not on file  Other Topics Concern  . Not on file  Social History Narrative  . Not on file    Family History:   Family History  Problem Relation Age of Onset  . Breast cancer Sister 13  . Thyroid cancer Mother   . Uterine cancer Paternal Grandmother 46     Review of Systems: [y] = yes, '[ ]'$  = no     General: Weight gain '[ ]'$ ; Weight loss '[ ]'$ ; Anorexia '[ ]'$ ; Fatigue '[ ]'$ ; Fever '[ ]'$ ; Chills '[ ]'$ ; Weakness '[ ]'$    Cardiac: Chest pain/pressure Blue.Reese ]; Resting SOB '[ ]'$ ; Exertional SOB '[ ]'$ ; Orthopnea '[ ]'$ ; Pedal Edema '[ ]'$ ; Palpitations '[ ]'$ ; Syncope '[ ]'$ ; Presyncope '[ ]'$ ; Paroxysmal nocturnal dyspnea'[ ]'$    Pulmonary: Cough '[ ]'$ ; Wheezing'[ ]'$ ; Hemoptysis'[ ]'$ ; Sputum '[ ]'$ ; Snoring '[ ]'$    GI: Vomiting'[ ]'$ ; Dysphagia'[ ]'$ ; Melena'[ ]'$ ;  Hematochezia '[ ]'$ ; Heartburn'[ ]'$ ; Abdominal pain '[ ]'$ ; Constipation '[ ]'$ ; Diarrhea '[ ]'$ ; BRBPR '[ ]'$    GU: Hematuria'[ ]'$ ; Dysuria '[ ]'$ ; Nocturia'[ ]'$    Vascular: Pain in legs with walking '[ ]'$ ; Pain in feet with lying flat '[ ]'$ ; Non-healing sores '[ ]'$ ; Stroke '[ ]'$ ; TIA '[ ]'$ ; Slurred speech '[ ]'$ ;   Neuro: Headaches'[ ]'$ ; Vertigo'[ ]'$ ; Seizures'[ ]'$ ; Paresthesias'[ ]'$ ;Blurred vision '[ ]'$ ; Diplopia '[ ]'$ ; Vision changes '[ ]'$    Ortho/Skin: Arthritis '[ ]'$ ; Joint pain '[ ]'$ ; Muscle pain '[ ]'$ ;  Joint swelling '[ ]'$ ; Back Pain '[ ]'$ ; Rash '[ ]'$    Psych: Depression'[ ]'$ ; Anxiety'[ ]'$    Heme: Bleeding problems '[ ]'$ ; Clotting disorders '[ ]'$ ; Anemia '[ ]'$    Endocrine: Diabetes '[ ]'$ ; Thyroid dysfunction'[ ]'$   Physical Exam/Data:   Vitals:   02/01/18 2048 02/01/18 2100 02/01/18 2244 02/02/18 0131  BP: 137/82 126/77 113/69 97/63  Pulse: (!) 102 99 91 81  Resp: (!) 26 (!) 21 (!) 24 (!) 25  Temp: 99.6 F (37.6 C)  99.5 F (37.5 C)   TempSrc: Oral  Oral   SpO2: 98% 99% 96% 97%   No intake or output data in the 24 hours ending 02/02/18 0211 There were no vitals filed for this visit. There is no height or weight on file to calculate BMI.  General:  Well nourished, well developed, in no acute distress. Somewhat uncomfortable with moving about in bed.  HEENT: normal Lymph: no adenopathy Neck: no JVD Endocrine:  No thryomegaly Vascular: No carotid bruits; FA pulses 2+ bilaterally without bruits  Cardiac:  normal S1, S2; RRR; no murmur. No rubs.  Lungs:  clear to auscultation bilaterally, no wheezing, rhonchi or rales  Abd: soft, nontender, no hepatomegaly  Ext: no edema Musculoskeletal:  No deformities, BUE and BLE strength normal and equal Skin: warm and dry  Neuro:  CNs 2-12 intact, no focal abnormalities noted Psych:  Normal affect   EKG:  The EKG was personally reviewed and demonstrates:  SR with subtle PR depressions in inferior leads.   Relevant CV Studies: TTE 06/2011: Study Conclusions  Left ventricle: The cavity size was normal.  Wall thickness was normal. The estimated ejection fraction was 60%. Wall motion was normal; there were no regional wall motion abnormalities.   Laboratory Data:  Chemistry Recent Labs  Lab 01/28/18 1600 02/01/18 2110  NA 142 134*  K 4.0 3.5  CL 101 101  CO2 24 24  GLUCOSE 87 115*  BUN 11 13  CREATININE 0.83 1.02*  CALCIUM 9.2 9.2  GFRNONAA 77 59*  GFRAA 89 >60  ANIONGAP  --  9    Recent Labs  Lab 01/28/18 1600 02/01/18 2110  PROT 6.4 6.7  ALBUMIN 4.6 4.1  AST 25 24  ALT 19 16  ALKPHOS 84 65  BILITOT 0.4 0.7   Hematology Recent Labs  Lab 01/28/18 1600 02/01/18 2110  WBC 7.4 9.3  RBC 3.84 3.72*  HGB 11.9 11.4*  HCT 35.5 36.0  MCV 92 96.8  MCH 31.0 30.6  MCHC 33.5 31.7  RDW 12.3 12.9  PLT 287 243   Cardiac EnzymesNo results for input(s): TROPONINI in the last 168 hours.  Recent Labs  Lab 02/01/18 2115 02/02/18 0019  TROPIPOC 0.00 0.00    BNPNo results for input(s): BNP, PROBNP in the last 168 hours.  DDimer No results for input(s): DDIMER in the last 168 hours.  Radiology/Studies:  Dg Chest 2 View  Result Date: 02/01/2018 CLINICAL DATA:  Chest pain. EXAM: CHEST - 2 VIEW COMPARISON:  March 17, 2016 FINDINGS: The cardiac silhouette appears mildly enlarged. Mild haziness lateral left lung base, likely atelectasis. No overt edema. No nodule, mass, or other infiltrate. IMPRESSION: The cardiac silhouette appears mildly enlarged, more prominent in the interval. Probable atelectasis at the lateral left lung base. Electronically Signed   By: Dorise Bullion III M.D   On: 02/01/2018 21:48   Ct Angio Chest Pe W/cm &/or Wo Cm  Result Date: 02/01/2018 CLINICAL DATA:  Chest pain at rest starting  today. Non radiating with pain worsening upon breathing. EXAM: CT ANGIOGRAPHY CHEST WITH CONTRAST TECHNIQUE: Multidetector CT imaging of the chest was performed using the standard protocol during bolus administration of intravenous contrast. Multiplanar CT image  reconstructions and MIPs were obtained to evaluate the vascular anatomy. CONTRAST:  64m ISOVUE-370 IOPAMIDOL (ISOVUE-370) INJECTION 76% COMPARISON:  Same day CXR FINDINGS: Cardiovascular: Common branch point for the right brachiocephalic and left common carotid arteries, normal variant. Nonaneurysmal thoracic aorta. Satisfactory opacification of the pulmonary arteries to the segmental level without acute pulmonary embolus. Cardiomegaly with trace pericardial effusion measuring up to 6 mm thickness. Mediastinum/Nodes: No enlarged mediastinal, hilar, or axillary lymph nodes. Thyroid gland, trachea, and esophagus demonstrate no significant findings. Lungs/Pleura: Left basilar atelectasis. Faint bilateral ground-glass opacities, nonspecific but may represent stigmata of hypoventilatory change, CHF, alveolitis or pneumonitis among some considerations though not exclusive. This is superimposed upon mild scattered centrilobular emphysema, upper lobe predominant. No effusion or pneumothorax. Upper Abdomen: No acute abnormality. Musculoskeletal: No chest wall abnormality. No acute or significant osseous findings. Review of the MIP images confirms the above findings. IMPRESSION: 1. No acute pulmonary embolus. 2. Faint bilateral ground-glass opacities, nonspecific but may represent stigmata of hypoventilatory change, CHF, alveolitis or pneumonitis among some considerations though not exclusive. 3. Mild centrilobular emphysema. Emphysema (ICD10-J43.9). Electronically Signed   By: DAshley RoyaltyM.D.   On: 02/01/2018 23:42    Assessment and Plan:   Ms. CBuskeis a 62year old woman with HTN, HLD, and MVP who presents with the acute onset of positional, pleuritic chest pain found to have trivial pericardial effusion and subtle PR depressions inferiorly potentially consistent with pericarditis. It not viral/idiopathic, would query whether this could be related to her recent breast cancer. She has no clinical features of  tamponade. Her pain and history doesn't seem typical of ACS and enzymes are negative, though she does have risk factors including HTN and HLD.   #Pleuritic CP; Concern for Pericarditis -- Would check inflammatory markers (ESR, CRP) -- Would hold diuretics for now given AKI. -- If inflammatory markers elevated, would plan to empirically treat for pericarditis given history and EKG with ibuprofen '600mg'$  TID for two weeks plus colchicine 0.'5mg'$  BID for three months.  -- Ensure discharged on PPI if plans to send out on the aforementioned regimen.  -- Would finish cycling troponins to ensure they are negative -- Daily EKG to monitor for evolving signs of percarditis -- Complete TTE in the AM to r/o WMAs, or other structural heart disease.   If ESR/CRP negative, then ddx likely needs to be revisited and ACS priortized.   For questions or updates, please contact CBellairePlease consult [email or web address] for contact info under   Signed, LMilus Banister MD  02/02/2018 2:11 AM

## 2018-02-02 NOTE — Progress Notes (Addendum)
Progress Note  Patient Name: Stephanie Dudley Date of Encounter: 02/02/2018  Primary Cardiologist:  New (Dr. Meda Coffee)  Subjective   Pt notes CP resolved shortly after she was given ibuprofen and colchicine. She still has DOE. No resting dyspnea. No syncope/ near syncope.   Inpatient Medications    Scheduled Meds: . colchicine  0.6 mg Oral BID  . [START ON 02/03/2018] enoxaparin (LOVENOX) injection  40 mg Subcutaneous Daily  . ibuprofen  600 mg Oral TID WC  . levothyroxine  88 mcg Oral QAC breakfast  . pantoprazole  40 mg Oral Q1200  . rosuvastatin  20 mg Oral Daily   Continuous Infusions:  PRN Meds: acetaminophen, ALPRAZolam, hydrALAZINE, HYDROmorphone (DILAUDID) injection, ondansetron (ZOFRAN) IV, oxyCODONE-acetaminophen, zolpidem   Vital Signs    Vitals:   02/02/18 0415 02/02/18 0430 02/02/18 0445 02/02/18 0527  BP: 1'03/66 90/60 98/66 '$ 98/66  Pulse: 77 81 76 81  Resp:  20 (!) 21 20  Temp:    99.2 F (37.3 C)  TempSrc:    Oral  SpO2: 92% 96% 92% 97%  Weight:    86.8 kg  Height:    '5\' 5"'$  (1.651 m)    Intake/Output Summary (Last 24 hours) at 02/02/2018 0958 Last data filed at 02/02/2018 0430 Gross per 24 hour  Intake 500 ml  Output -  Net 500 ml   Filed Weights   02/02/18 0527  Weight: 86.8 kg    Telemetry    NSR - Personally Reviewed  ECG    NSR, diffuse ST elevations - Personally Reviewed  Physical Exam   GEN: No acute distress.   Neck: No JVD Cardiac: RRR, no murmurs, rubs, or gallops.  Respiratory: Clear to auscultation bilaterally. GI: Soft, nontender, non-distended  MS: No edema; No deformity. Neuro:  Nonfocal  Psych: Normal affect   Labs    Chemistry Recent Labs  Lab 01/28/18 1600 02/01/18 2110  NA 142 134*  K 4.0 3.5  CL 101 101  CO2 24 24  GLUCOSE 87 115*  BUN 11 13  CREATININE 0.83 1.02*  CALCIUM 9.2 9.2  PROT 6.4 6.7  ALBUMIN 4.6 4.1  AST 25 24  ALT 19 16  ALKPHOS 84 65  BILITOT 0.4 0.7  GFRNONAA 77 59*    GFRAA 89 >60  ANIONGAP  --  9     Hematology Recent Labs  Lab 01/28/18 1600 02/01/18 2110  WBC 7.4 9.3  RBC 3.84 3.72*  HGB 11.9 11.4*  HCT 35.5 36.0  MCV 92 96.8  MCH 31.0 30.6  MCHC 33.5 31.7  RDW 12.3 12.9  PLT 287 243    Cardiac Enzymes Recent Labs  Lab 02/02/18 0140 02/02/18 0631  TROPONINI <0.03 <0.03    Recent Labs  Lab 02/01/18 2115 02/02/18 0019  TROPIPOC 0.00 0.00     BNP Recent Labs  Lab 02/02/18 0137  BNP 18.8     DDimer No results for input(s): DDIMER in the last 168 hours.   Radiology    Dg Chest 2 View  Result Date: 02/01/2018 CLINICAL DATA:  Chest pain. EXAM: CHEST - 2 VIEW COMPARISON:  March 17, 2016 FINDINGS: The cardiac silhouette appears mildly enlarged. Mild haziness lateral left lung base, likely atelectasis. No overt edema. No nodule, mass, or other infiltrate. IMPRESSION: The cardiac silhouette appears mildly enlarged, more prominent in the interval. Probable atelectasis at the lateral left lung base. Electronically Signed   By: Dorise Bullion III M.D   On: 02/01/2018 21:48  Ct Angio Chest Pe W/cm &/or Wo Cm  Result Date: 02/01/2018 CLINICAL DATA:  Chest pain at rest starting today. Non radiating with pain worsening upon breathing. EXAM: CT ANGIOGRAPHY CHEST WITH CONTRAST TECHNIQUE: Multidetector CT imaging of the chest was performed using the standard protocol during bolus administration of intravenous contrast. Multiplanar CT image reconstructions and MIPs were obtained to evaluate the vascular anatomy. CONTRAST:  86m ISOVUE-370 IOPAMIDOL (ISOVUE-370) INJECTION 76% COMPARISON:  Same day CXR FINDINGS: Cardiovascular: Common branch point for the right brachiocephalic and left common carotid arteries, normal variant. Nonaneurysmal thoracic aorta. Satisfactory opacification of the pulmonary arteries to the segmental level without acute pulmonary embolus. Cardiomegaly with trace pericardial effusion measuring up to 6 mm thickness.  Mediastinum/Nodes: No enlarged mediastinal, hilar, or axillary lymph nodes. Thyroid gland, trachea, and esophagus demonstrate no significant findings. Lungs/Pleura: Left basilar atelectasis. Faint bilateral ground-glass opacities, nonspecific but may represent stigmata of hypoventilatory change, CHF, alveolitis or pneumonitis among some considerations though not exclusive. This is superimposed upon mild scattered centrilobular emphysema, upper lobe predominant. No effusion or pneumothorax. Upper Abdomen: No acute abnormality. Musculoskeletal: No chest wall abnormality. No acute or significant osseous findings. Review of the MIP images confirms the above findings. IMPRESSION: 1. No acute pulmonary embolus. 2. Faint bilateral ground-glass opacities, nonspecific but may represent stigmata of hypoventilatory change, CHF, alveolitis or pneumonitis among some considerations though not exclusive. 3. Mild centrilobular emphysema. Emphysema (ICD10-J43.9). Electronically Signed   By: DAshley RoyaltyM.D.   On: 02/01/2018 23:42    Cardiac Studies   2D Echo pending  Patient Profile     Stephanie Dudley a 89year old woman with HTN, HLD, MVP, hypothyroidism and breast CA s/p of right lumpectomy with radioactive seed who presents with the acute onset of positional, pleuritic chest pain found to have trivial pericardial effusion and subtle PR depressions inferiorly potentially consistent with pericarditis. If not viral/idiopathic, would query whether this could be related to her recent breast cancer. Pt seen by cardiology Fellow overnight. Based on assessment, she did not have clinical features of tamponade. Her pain and history doesn't seem typical of ACS and enzymes are negative, though she does have risk factors including HTN and HLD.  Assessment & Plan    1. Pleuritic CP concerning for Pericarditis: Chest CT negative for PE but did show cardiomegaly with trace pericardial effusion measuring up to 6 mm thickness. EKG  does show diffuse ST elevations. Troponins are negative x 3. Inflammatory markers checked. CRP abnormal at 3.0 however ESR is at upper end of normal at 22 mm/hr. 2D echo pending.  Good response after initial dose of ibuprofen and colchcine. CP resolved after first dose. Still has DOE. No syncope/ near syncope.  Continue ibuprofen '600mg'$  TID for two weeks plus colchicine 0.'6mg'$  BID for three months. Will will f/u on echo to ensure no significant pericardial effusion.   For questions or updates, please contact CLewellenPlease consult [email or web address] for contact info under     Signed, BLyda Jester PA-C  02/02/2018, 9:58 AM      The patient was seen, examined and discussed with Brittainy M. SRosita Fire PA-C and I agree with the above.   A very pleasant 62year old female who presented with typical positional pleuritic chest pain that started yesterday, with pain being worse in horizontal position and alleviated when sitting up, worse on deep inspiration.  The patient denies any recent upper respiratory infection or any other acute illness.  She had negative troponin, mildly elevated CRP at  3 and sedimentation rate of 22, her EKG has diffuse ST elevations, she was started on empiric therapy for acute pericarditis and was started on ibuprofen and colchicine with almost immediate improvement.  We will await results of echocardiogram but unless any significant abnormalities we will plan for discharge later today.  We would recommend ibuprofen 400 mg p.o. twice daily for 2 weeks and call she is seen 0.6 mg p.o. twice daily for 3 months.  We will arrange for follow-up in our clinic.  Ena Dawley, MD 02/02/2018

## 2018-02-02 NOTE — Discharge Summary (Signed)
Physician Discharge Summary  Stephanie Dudley UXL:[id number] DOB: May 20, 1955 DOA: 02/01/2018  PCP: Glendale Chard, MD  Admit date: 02/01/2018 Discharge date: 02/02/2018  Time spent: 40 minutes  Recommendations for Outpatient Follow-up:  1. Cardiology office will contact for follow up 2. Take medications as directed   Discharge Diagnoses:  Principal Problem:   Chest pain Active Problems:   Hyperlipidemia   Hypertension   Hypothyroidism   Breast cancer ([place])   Ankle swelling   Pericarditis   Discharge Condition: stable and pain free  Diet recommendation: heart healthy  Filed Weights   02/02/18 0527  Weight: 86.8 kg    History of present illness:  Stephanie Dudley is a 62 y.o. female with HTN, HLD, breast ca s/p R lumpectomy with radioactive seed, who presented on 02/02/18 with acute onset,postitional chest pain. Workup included cardiology consult who opined trivial pericardial effusion and sbutle PR depression inferiorly potentally consistent with pericarditis.    Hospital Course:   Chest pain due to possible pericarditis: Patient has typical pleuritic sharp chest pain, CT angiogram is negative for PE, but showed pericardial effusion.  EKG has PR segment elevation in aVR, consistent with pericarditis.  Troponin negative x2 in ED. Echo  Not significant per cardiology with printed results pending. Cardiology, Dr. Marletta Lor was consulted and recommended obs with echo and ESR/CRP as well as ibuprofen '600mg'$  TID and colchicine 0.'6mg'$  BID. Pain resolved after first dose of ibuprofen. PPI initiated as well.   Hyperlipidemia: -crestor  HTN:  -Irbesartan -Her Lasix and spironolactone which is for ankle edema -IV hydralazine prn Procedures:  echo  Consultations:  [facility]  Discharge Exam: Vitals:   02/02/18 0527 02/02/18 1213  BP: 98/66 91/60  Pulse: 81 71  Resp: 20 20  Temp: 99.2 F (37.3 C) 98.2 F (36.8 C)  SpO2: 97% 97%    General: awake alert  well nourished in no acute distress Cardiovascular: rrr no mgr no LE edema Respiratory: normal effort BS clear bilaterally no wheeze  Discharge Instructions   Discharge Instructions    Call MD for:  difficulty breathing, headache or visual disturbances   Complete by:  As directed    Call MD for:  severe uncontrolled pain   Complete by:  As directed    Call MD for:  temperature >100.4   Complete by:  As directed    Diet - low sodium heart healthy   Complete by:  As directed    Discharge instructions   Complete by:  As directed    Take medications as directed. Cardiology office will contact you to schedule follow up   Increase activity slowly   Complete by:  As directed      Allergies as of 02/02/2018      Reactions   Sulfa Antibiotics Swelling      Medication List    TAKE these medications   colchicine 0.6 MG tablet Take 1 tablet (0.6 mg total) by mouth 2 (two) times daily.   furosemide 40 MG tablet Commonly known as:  LASIX Take 40 mg by mouth daily.   ibuprofen 600 MG tablet Commonly known as:  ADVIL,MOTRIN Take 1 tablet (600 mg total) by mouth 2 (two) times daily for 14 days.   pantoprazole 40 MG tablet Commonly known as:  PROTONIX Take 1 tablet (40 mg total) by mouth daily before breakfast.   rosuvastatin 20 MG tablet Commonly known as:  CRESTOR Take 20 mg by mouth daily.   spironolactone 25 MG tablet Commonly known as:  ALDACTONE TAKE ONE TABLET BY MOUTH DAILY   SYNTHROID 88 MCG tablet Generic drug:  levothyroxine Take 88 mcg by mouth daily.   valsartan 160 MG tablet Commonly known as:  DIOVAN TAKE ONE TABLET BY MOUTH DAILY      Allergies  Allergen Reactions  . Sulfa Antibiotics Swelling   Follow-up Information    Consuelo Pandy, PA-C Follow up on 03/01/2018.   Specialties:  Cardiology, Radiology Why:  3:30 PM  Contact information: [address] [place] [address] [phone]        Glendale Chard, MD. Schedule an  appointment as soon as possible for a visit in 1 week(s).   Specialty:  Internal Medicine Contact information: [address] [place] [address] [phone]            The results of significant diagnostics from this hospitalization (including imaging, microbiology, ancillary and laboratory) are listed below for reference.    Significant Diagnostic Studies: Dg Chest 2 View  Result Date: 02/01/2018 CLINICAL DATA:  Chest pain. EXAM: CHEST - 2 VIEW COMPARISON:  March 17, 2016 FINDINGS: The cardiac silhouette appears mildly enlarged. Mild haziness lateral left lung base, likely atelectasis. No overt edema. No nodule, mass, or other infiltrate. IMPRESSION: The cardiac silhouette appears mildly enlarged, more prominent in the interval. Probable atelectasis at the lateral left lung base. Electronically Signed   By: Dorise Bullion III M.D   On: 02/01/2018 21:48   Ct Angio Chest Pe W/cm &/or Wo Cm  Result Date: 02/01/2018 CLINICAL DATA:  Chest pain at rest starting today. Non radiating with pain worsening upon breathing. EXAM: CT ANGIOGRAPHY CHEST WITH CONTRAST TECHNIQUE: Multidetector CT imaging of the chest was performed using the standard protocol during bolus administration of intravenous contrast. Multiplanar CT image reconstructions and MIPs were obtained to evaluate the vascular anatomy. CONTRAST:  55m ISOVUE-370 IOPAMIDOL (ISOVUE-370) INJECTION 76% COMPARISON:  Same day CXR FINDINGS: Cardiovascular: Common branch point for the right brachiocephalic and left common carotid arteries, normal variant. Nonaneurysmal thoracic aorta. Satisfactory opacification of the pulmonary arteries to the segmental level without acute pulmonary embolus. Cardiomegaly with trace pericardial effusion measuring up to 6 mm thickness. Mediastinum/Nodes: No enlarged mediastinal, hilar, or axillary lymph nodes. Thyroid gland, trachea, and esophagus demonstrate no significant findings. Lungs/Pleura:  Left basilar atelectasis. Faint bilateral ground-glass opacities, nonspecific but may represent stigmata of hypoventilatory change, CHF, alveolitis or pneumonitis among some considerations though not exclusive. This is superimposed upon mild scattered centrilobular emphysema, upper lobe predominant. No effusion or pneumothorax. Upper Abdomen: No acute abnormality. Musculoskeletal: No chest wall abnormality. No acute or significant osseous findings. Review of the MIP images confirms the above findings. IMPRESSION: 1. No acute pulmonary embolus. 2. Faint bilateral ground-glass opacities, nonspecific but may represent stigmata of hypoventilatory change, CHF, alveolitis or pneumonitis among some considerations though not exclusive. 3. Mild centrilobular emphysema. Emphysema (ICD10-J43.9). Electronically Signed   By: DAshley RoyaltyM.D.   On: 02/01/2018 23:42    Microbiology: No results found for this or any previous visit (from the past 240 hour(s)).   Labs: Basic Metabolic Panel: Recent Labs  Lab 01/28/18 1600 02/01/18 2110  NA 142 134*  K 4.0 3.5  CL 101 101  CO2 24 24  GLUCOSE 87 115*  BUN 11 13  CREATININE 0.83 1.02*  CALCIUM 9.2 9.2   Liver Function Tests: Recent Labs  Lab 01/28/18 1600 02/01/18 2110  AST 25 24  ALT 19 16  ALKPHOS 84 65  BILITOT 0.4 0.7  PROT 6.4 6.7  ALBUMIN 4.6 4.1   Recent Labs  Lab 02/01/18 2110  LIPASE 36   No results for input(s): AMMONIA in the last 168 hours. CBC: Recent Labs  Lab 01/28/18 1600 02/01/18 2110  WBC 7.4 9.3  NEUTROABS  --  7.1  HGB 11.9 11.4*  HCT 35.5 36.0  MCV 92 96.8  PLT 287 243   Cardiac Enzymes: Recent Labs  Lab 02/02/18 0140 02/02/18 0631  TROPONINI <0.03 <0.03   BNP: BNP (last 3 results) Recent Labs    02/02/18 0137  BNP 18.8    ProBNP (last 3 results) No results for input(s): PROBNP in the last 8760 hours.  CBG: No results for input(s): GLUCAP in the last 168 hours.     SignedRadene Gunning  NP [facility] 02/02/2018, 2:33 PM

## 2018-02-02 NOTE — Progress Notes (Signed)
Pt discharge education provided at bedside by CN  Pt IV and telemetry removed  Pt has all belongings Awaiting transport

## 2018-02-03 LAB — ECHOCARDIOGRAM COMPLETE
Height: 65 in
Weight: 3060.8 oz

## 2018-02-10 ENCOUNTER — Encounter: Payer: Self-pay | Admitting: Cardiology

## 2018-02-12 ENCOUNTER — Other Ambulatory Visit: Payer: Self-pay | Admitting: Internal Medicine

## 2018-02-15 ENCOUNTER — Ambulatory Visit (INDEPENDENT_AMBULATORY_CARE_PROVIDER_SITE_OTHER): Payer: Federal, State, Local not specified - PPO

## 2018-02-15 ENCOUNTER — Ambulatory Visit: Payer: Federal, State, Local not specified - PPO | Admitting: Sports Medicine

## 2018-02-15 ENCOUNTER — Encounter: Payer: Self-pay | Admitting: Sports Medicine

## 2018-02-15 ENCOUNTER — Other Ambulatory Visit: Payer: Self-pay | Admitting: Sports Medicine

## 2018-02-15 VITALS — BP 125/72 | HR 92

## 2018-02-15 DIAGNOSIS — M19071 Primary osteoarthritis, right ankle and foot: Secondary | ICD-10-CM | POA: Diagnosis not present

## 2018-02-15 DIAGNOSIS — M778 Other enthesopathies, not elsewhere classified: Secondary | ICD-10-CM

## 2018-02-15 DIAGNOSIS — M79671 Pain in right foot: Secondary | ICD-10-CM | POA: Diagnosis not present

## 2018-02-15 DIAGNOSIS — M79672 Pain in left foot: Secondary | ICD-10-CM

## 2018-02-15 DIAGNOSIS — M779 Enthesopathy, unspecified: Secondary | ICD-10-CM

## 2018-02-15 DIAGNOSIS — M722 Plantar fascial fibromatosis: Secondary | ICD-10-CM

## 2018-02-15 NOTE — Progress Notes (Signed)
Subjective: Stephanie Dudley is a 62 y.o. female patient presents to office with complaint of 1.  Pain to the dorsal aspect of the right foot states that her lump on the top of her foot used to be a cyst but has noticed over the last few months there has been more pain with pressure to be top of the right foot states that during the summer she wore a lot of sandals so no shoes aggravated the top of her right foot but now that she is closed toed shoes does feel a dull ache across the top of the foot with long periods of standing and walking and some swelling noted especially with direct pressure.  2.  Pain that is mild to moderate heel pain on the left over the last 3 months that has been aggravated by walking or standing too long patient reports especially pain with first few steps out of bed in the morning and after about a period of standing of about 20 minutes has pain to her heel states that she has only tried changing of shoes but has not noticed any difference to help her heel on the left. Denies any other pedal complaints.   Review of Systems  All other systems reviewed and are negative.    Patient Active Problem List   Diagnosis Date Noted  . Breast cancer ([place]) 02/02/2018  . Chest pain 02/02/2018  . Pericarditis 02/02/2018  . Hyperlipidemia   . Hypertension   . Hypothyroidism   . Ankle swelling   . Genetic testing 11/08/2017  . Family history of breast cancer   . Family history of thyroid cancer   . Family history of uterine cancer     Current Outpatient Medications on File Prior to Visit  Medication Sig Dispense Refill  . colchicine 0.6 MG tablet Take 1 tablet (0.6 mg total) by mouth 2 (two) times daily. 60 tablet 1  . furosemide (LASIX) 40 MG tablet Take 40 mg by mouth daily.     Marland Kitchen ibuprofen (ADVIL,MOTRIN) 600 MG tablet Take 1 tablet (600 mg total) by mouth 2 (two) times daily for 14 days. 28 tablet 0  . pantoprazole (PROTONIX) 40 MG tablet Take 1 tablet (40 mg total) by  mouth daily before breakfast. 30 tablet 0  . rosuvastatin (CRESTOR) 20 MG tablet Take 20 mg by mouth daily.    Marland Kitchen spironolactone (ALDACTONE) 25 MG tablet TAKE ONE TABLET BY MOUTH DAILY (Patient taking differently: Take 25 mg by mouth daily. ) 90 tablet 0  . SYNTHROID 88 MCG tablet TAKE ONE TABLET BY MOUTH DAILY 30 tablet 2  . valsartan (DIOVAN) 160 MG tablet TAKE ONE TABLET BY MOUTH DAILY (Patient taking differently: Take 160 mg by mouth daily. ) 90 tablet 0   No current facility-administered medications on file prior to visit.     Allergies  Allergen Reactions  . Sulfa Antibiotics Swelling    Objective: Physical Exam General: The patient is alert and oriented x3 in no acute distress.  Dermatology: Skin is warm, dry and supple bilateral lower extremities. Nails 1-10 are normal. There is a focal raised soft tissue mass noted at the dorsal midfoot on the right with palpable bony prominence likely suggestive of inflamed joint capsule versus recurrent cyst, there is no erythema, edema, no eccymosis, no open lesions present. Integument is otherwise unremarkable.  Vascular: Dorsalis Pedis pulse and Posterior Tibial pulse are 2/4 bilateral. Capillary fill time is immediate to all digits.  Neurological: Grossly intact to light  touch with an achilles reflex of +2/5 and a  negative Tinel's sign bilateral.  Musculoskeletal: Tenderness to palpation at the medial calcaneal tubercale and through the insertion of the plantar fascia on the left foot consistent with plantar fasciitis no pain with compression of calcaneus bilateral. No pain with tuning fork to calcaneus bilateral. No pain with calf compression bilateral. There is decreased Ankle joint range of motion bilateral. All other joints range of motion within normal limits bilateral.  Pes planus foot type bilateral.  Strength 5/5 in all groups bilateral.   Xray, Right/Left foot:  Normal osseous mineralization. Joint spaces preserved except at  midfoot where there is evidence of likely arthritis on the right and arthritis at the second metatarsophalangeal joint with squaring of the second metatarsal head. No fracture/dislocation/boney destruction. Calcaneal spur present with mild thickening of plantar fascia on left. No other soft tissue abnormalities or radiopaque foreign bodies.   Assessment and Plan: Problem List Items Addressed This Visit    None    Visit Diagnoses    Right foot pain    -  Primary   Capsulitis of right foot       Relevant Orders   DG Foot Complete Left (Completed)   Arthritis of right foot       Plantar fasciitis, left       Bilateral foot pain          -Complete examination performed.  -Xrays reviewed -Discussed with patient in detail the condition of arthritis with this versus capsulitis on right and left plantar fasciitis, how this occurs and general treatment options. Explained both conservative and surgical treatments.  -Patient declined injection therapy for her left -Recommended good supportive shoes and advised use of dorsal offloading padding as provided at this visit for the right -Explained and dispensed to patient daily stretching exercises. -Recommend patient to ice affected area 1-2x daily. -Patient to return to office as scheduled if pain is not better or sooner if problems or questions arise.  Advised patient if pain continues will benefit from an injection on the left heel and an ultrasound or an MRI at the right foot to further evaluate for cyst versus capsulitis in the setting of arthritis.  Landis Martins, DPM

## 2018-02-16 ENCOUNTER — Other Ambulatory Visit: Payer: Self-pay

## 2018-02-16 ENCOUNTER — Telehealth: Payer: Self-pay

## 2018-02-16 MED ORDER — SYNTHROID 88 MCG PO TABS: 88.0000 ug | ORAL_TABLET | Freq: Every day | ORAL | 2 refills | Status: DC

## 2018-02-16 NOTE — Telephone Encounter (Signed)
Returned the pt's call and scheduled her an appt for a hospital f/u..  I asked the pt was her medications changed and the pt said yes and that she went to [facility] hospital.

## 2018-02-21 ENCOUNTER — Encounter: Payer: Self-pay | Admitting: Nurse Practitioner

## 2018-02-21 ENCOUNTER — Ambulatory Visit (INDEPENDENT_AMBULATORY_CARE_PROVIDER_SITE_OTHER): Payer: Federal, State, Local not specified - PPO | Admitting: Nurse Practitioner

## 2018-02-21 VITALS — BP 122/74 | HR 81 | Temp 98.1°F | Ht 64.5 in | Wt 186.2 lb

## 2018-02-21 DIAGNOSIS — Z09 Encounter for follow-up examination after completed treatment for conditions other than malignant neoplasm: Secondary | ICD-10-CM | POA: Diagnosis not present

## 2018-02-21 DIAGNOSIS — I309 Acute pericarditis, unspecified: Secondary | ICD-10-CM | POA: Diagnosis not present

## 2018-02-21 NOTE — Progress Notes (Signed)
Subjective:     Patient ID: Stephanie Dudley , female    DOB: [birth year] , 62 y.o.   MRN: [id number]   Chief Complaint  Patient presents with  . Hospitalization Follow-up    pt went to the hospital and was told she has fluid around her heart    HPI  Here for hospital follow up admitted on 11/12-11/13 with chest pain, she was unable to lie down, sit up, shortness of breath.  She reports she had fluid around the sac of her heart.  She had an ECHO while at the hospital.  Since being home from the hospital she continues to have shortness of breath, occasionally when she lies on her side causes pain.  She has been taking ibuprofen twice a day.  Her appt with the Cardiologist December 10th.  She is also taking colchicine 0.6 mg three times a day.     Past Medical History:  Diagnosis Date  . Ankle swelling   . Arthritis   . Cancer ([place]) 08/2017   right breast cancer  . Complication of anesthesia    hard to wake up with thyroid surgery  . Family history of breast cancer   . Family history of thyroid cancer   . Family history of uterine cancer   . Hyperlipidemia   . Hypertension   . Hypothyroidism   . Mitral valve prolapse   . Peripheral edema    ankles, feet  . Thyroid disease      Family History  Problem Relation Age of Onset  . Breast cancer Sister 19  . Thyroid cancer Mother   . Uterine cancer Paternal Grandmother 47     Current Outpatient Medications:  .  colchicine 0.6 MG tablet, Take 1 tablet (0.6 mg total) by mouth 2 (two) times daily., Disp: 60 tablet, Rfl: 1 .  furosemide (LASIX) 40 MG tablet, Take 40 mg by mouth daily. , Disp: , Rfl:  .  pantoprazole (PROTONIX) 40 MG tablet, Take 1 tablet (40 mg total) by mouth daily before breakfast., Disp: 30 tablet, Rfl: 0 .  rosuvastatin (CRESTOR) 20 MG tablet, Take 20 mg by mouth daily., Disp: , Rfl:  .  spironolactone (ALDACTONE) 25 MG tablet, TAKE ONE TABLET BY MOUTH DAILY (Patient taking differently: Take 25 mg by mouth  daily. ), Disp: 90 tablet, Rfl: 0 .  SYNTHROID 88 MCG tablet, Take 1 tablet (88 mcg total) by mouth daily., Disp: 30 tablet, Rfl: 2 .  valsartan (DIOVAN) 160 MG tablet, TAKE ONE TABLET BY MOUTH DAILY (Patient taking differently: Take 160 mg by mouth daily. ), Disp: 90 tablet, Rfl: 0   Allergies  Allergen Reactions  . Sulfa Antibiotics Swelling     Review of Systems  Constitutional: Negative.  Negative for fatigue.  Eyes: Negative for visual disturbance.  Respiratory: Negative.  Negative for shortness of breath.   Cardiovascular: Negative.  Negative for chest pain, palpitations and leg swelling.  Gastrointestinal: Negative.   Endocrine: Negative.   Musculoskeletal: Negative.   Skin: Negative.   Neurological: Negative for dizziness, weakness and headaches.  Psychiatric/Behavioral: Negative for confusion. The patient is not nervous/anxious.      Today's Vitals   02/21/18 1614  BP: 122/74  Pulse: 81  Temp: 98.1 F (36.7 C)  TempSrc: Oral  SpO2: 90%  Weight: 186 lb 3.2 oz (84.5 kg)  Height: 5' 4.5" (1.638 m)  PainSc: 2   PainLoc: Chest   Body mass index is 31.47 kg/m.   Objective:  Physical  Exam  Constitutional: She is oriented to person, place, and time. She appears well-developed and well-nourished.  Eyes: Pupils are equal, round, and reactive to light.  Cardiovascular: Normal rate, regular rhythm, normal heart sounds and intact distal pulses.  No murmur heard. Pulmonary/Chest: Effort normal and breath sounds normal.  Musculoskeletal: Normal range of motion. She exhibits no edema.  Neurological: She is alert and oriented to person, place, and time. No cranial nerve deficit.  Skin: Skin is warm and dry. Capillary refill takes less than 2 seconds.  Psychiatric: She has a normal mood and affect.  Vitals reviewed.       Assessment And Plan:     1. Acute pericarditis, unspecified type  Continue your follow up appt with the Cardiologist Dr. Meda Coffee  Continue with  diuretics as directed  Mild dyspnea yet better than when she was admitted to the hospital when lying down however her SPO2 is within normal range today    Minette Brine, FNP

## 2018-02-21 NOTE — Patient Instructions (Signed)
Pericarditis Pericarditis is swelling and irritation (inflammation) of your pericardium. The pericardium is a thin, double-layered, fluid-filled sac that surrounds your heart. The pericardium protects and holds your heart in your chest cavity. Inflammation of your pericardium can cause rubbing (friction) between the two layers when your heart beats. Fluid may build up between the layers of the sac (pericardial effusion). Different types of pericarditis include:  Acute pericarditis. Inflammation develops suddenly and causes pericardial effusion.  Chronic pericarditis. Inflammation may develop gradually, or it may continue after acute pericarditis and last longer than 6 months.  Constrictive pericarditis. The layers of the pericardium stiffen and develop scar tissue. The scar tissue thickens and sticks together. This makes it difficult for the heart to pump and to work as it normally does. This type is rare.  In most cases, pericarditis is acute and not serious. Chronic pericarditis and constrictive pericarditis may be more serious and may require treatment. What are the causes? Often, the cause of pericarditis is not known.If a cause is found, the cause may be:  A viral infection.  A heart attack (myocardial infarction).  Open-heart surgery (coronary artery bypass graft surgery).  Chest injury.  Autoimmune conditions, such as lupus or rheumatoid arthritis.  Kidney failure.  Low-functioning thyroid gland (hypothyroidism).  Cancer from another part of the body that has spread (metastasized) to the pericardium.  Radiation treatment.  Certain medicines, including some seizure medicines, blood thinners, heart medicines, and antibiotics.  A bacterial or fungal infection. This cause is less common.  What increases the risk? The following factors may increase your risk of pericarditis:  Being female.  Being 81-55 years old.  Having had pericarditis before.  Having had a recent  upper respiratory tract infection.  What are the signs or symptoms? The most common symptom of pericarditis is chest pain. This pain may:  Be in the center of your chest or the left side of your chest.  Not go away with rest.  Last for many hours or days.  Worsen when you lie down and go away when you sit up and lean forward.  Worsen when you swallow.  Move to your back, neck, or shoulder.  Other symptoms may include:  A chronic, dry cough.  Heart palpitations. These may feel like rapid, fluttering, or pounding heartbeats.  Dizziness or fainting.  Tiredness or fatigue.  Fever.  Rapid breathing.  Shortness of breath when lying down.  How is this diagnosed? This condition is diagnosed with a medical history, physical exam, and diagnostic tests. During your physical exam, your health care provider will listen for friction while your heart beats (pericardial rub). You may also have tests, including:  Blood work to look for signs of infection and inflammation.  Electrocardiogram (ECG).  Echocardiogram.  CT scan.  MRI.  Culture of pericardial fluid.  A tissue sample (biopsy) of the pericardium.  If tests show that you may have constrictive pericarditis, you may have a procedure (cardiac catheterization) to confirm this diagnosis. How is this treated? Treatment for this condition depends on the cause and type of pericarditis. In most cases, acute pericarditis will clear up on its own within 10 days. Treatment for other types of pericarditis may include:  Medicines, such as: ? NSAIDs for pain and inflammation. ? Steroids to reduce inflammation. ? Colchicine to relieve pain and inflammation.  A procedure to remove fluid using a needle (pericardiocentesis) if pericardial effusion puts pressure on the heart.  Surgery to remove part of the pericardium if constrictive pericarditis  develops.  If another condition is causing your pericarditis, you may need treatment  for that underlying condition. Follow these instructions at home:  Do not use tobacco products, including cigarettes, chewing tobacco, or e-cigarettes. If you need help quitting, ask your health care provider.  Maintain a healthy weight.  Follow an exercise program as told by your health care provider. You may need to limit your exercise until your symptoms go away.  Eat a heart-healthy diet. A registered dietitian can help you to learn about healthy food choices.  Take over-the-counter and prescription medicines only as told by your health care provider. Keep a list of all of your medicines with you at all times. For each medicine, include information about the name, the dosage, how often you take it, and how you take it.  Keep all follow-up visits as told by your health care provider. This is important. Contact a health care provider if:  You continue to have symptoms of pericarditis.  You develop new symptoms of pericarditis.  Your symptoms get worse. Get help right away if:  You have worsening chest pain and difficulty breathing. These symptoms may represent a serious problem that is an emergency. Do not wait to see if the symptoms will go away. Get medical help right away. Call your local emergency services (911 in the [place] Do not drive yourself to the hospital. This information is not intended to replace advice given to you by your health care provider. Make sure you discuss any questions you have with your health care provider. Document Released: 09/02/2000 Document Revised: 08/12/2015 Document Reviewed: 09/19/2014 Elsevier Interactive Patient Education  [employer].

## 2018-02-26 ENCOUNTER — Other Ambulatory Visit: Payer: Self-pay

## 2018-02-26 ENCOUNTER — Emergency Department (HOSPITAL_BASED_OUTPATIENT_CLINIC_OR_DEPARTMENT_OTHER): Payer: Federal, State, Local not specified - PPO

## 2018-02-26 ENCOUNTER — Encounter (HOSPITAL_BASED_OUTPATIENT_CLINIC_OR_DEPARTMENT_OTHER): Payer: Self-pay | Admitting: Emergency Medicine

## 2018-02-26 ENCOUNTER — Emergency Department (HOSPITAL_BASED_OUTPATIENT_CLINIC_OR_DEPARTMENT_OTHER)
Admission: EM | Admit: 2018-02-26 | Discharge: 2018-02-26 | Disposition: A | Discharge status: Home / Self Care | Payer: Federal, State, Local not specified - PPO | Attending: Emergency Medicine | Admitting: Emergency Medicine

## 2018-02-26 DIAGNOSIS — I10 Essential (primary) hypertension: Secondary | ICD-10-CM | POA: Insufficient documentation

## 2018-02-26 DIAGNOSIS — M545 Low back pain, unspecified: Secondary | ICD-10-CM

## 2018-02-26 DIAGNOSIS — Z79899 Other long term (current) drug therapy: Secondary | ICD-10-CM | POA: Diagnosis not present

## 2018-02-26 DIAGNOSIS — Z87891 Personal history of nicotine dependence: Secondary | ICD-10-CM | POA: Diagnosis not present

## 2018-02-26 DIAGNOSIS — E039 Hypothyroidism, unspecified: Secondary | ICD-10-CM | POA: Insufficient documentation

## 2018-02-26 DIAGNOSIS — Z853 Personal history of malignant neoplasm of breast: Secondary | ICD-10-CM | POA: Insufficient documentation

## 2018-02-26 MED ORDER — KETOROLAC TROMETHAMINE 30 MG/ML IJ SOLN
15.0000 mg | Freq: Once | INTRAMUSCULAR | Status: AC
  Administered 2018-02-26: 15 mg via INTRAMUSCULAR
  Filled 2018-02-26: qty 1

## 2018-02-26 MED ORDER — DICLOFENAC SODIUM 1 % TD GEL: 2.0000 g | Freq: Four times a day (QID) | TRANSDERMAL | 0 refills | Status: DC

## 2018-02-26 MED ORDER — METHOCARBAMOL 500 MG PO TABS: 500.0000 mg | ORAL_TABLET | Freq: Two times a day (BID) | ORAL | 0 refills | Status: DC

## 2018-02-26 NOTE — ED Provider Notes (Signed)
[facility] EMERGENCY DEPARTMENT Provider Note   CSN: [id number] Arrival date & time: 02/26/18  1106     History   Chief Complaint Chief Complaint  Patient presents with  . Back Pain    HPI Stephanie Dudley is a 62 y.o. female with history of hypertension, hypothyroidism, breast cancer in remission who presents with low back pain that began 2 days ago when the patient bent down to pick something up.  She reports it grabbed her.  She has been taking Tylenol with some relief, however it is worse when she moves.  She denies any numbness or tingling, but has had some pain radiating to her buttocks and upper legs.  She denies any saddle anesthesia, loss of bowel or bladder control, history of IVDU, history of procedure to back, fevers.  Patient does have history of cancer.  Patient has had similar symptoms in her back several years ago and it resolved with a shot and time.  Patient is currently being treated for pericarditis.  She has been taking colchicine and ibuprofen.  She is no longer taking ibuprofen, but continues to colchicine.  HPI  Past Medical History:  Diagnosis Date  . Ankle swelling   . Arthritis   . Cancer ([place]) 08/2017   right breast cancer  . Complication of anesthesia    hard to wake up with thyroid surgery  . Family history of breast cancer   . Family history of thyroid cancer   . Family history of uterine cancer   . Hyperlipidemia   . Hypertension   . Hypothyroidism   . Mitral valve prolapse   . Peripheral edema    ankles, feet  . Thyroid disease     Patient Active Problem List   Diagnosis Date Noted  . Breast cancer ([place]) 02/02/2018  . Chest pain 02/02/2018  . Pericarditis 02/02/2018  . Hyperlipidemia   . Hypertension   . Hypothyroidism   . Ankle swelling   . Genetic testing 11/08/2017  . Family history of breast cancer   . Family history of thyroid cancer   . Family history of uterine cancer     Past Surgical History:  Procedure  Laterality Date  . APPENDECTOMY  1994  . BREAST BIOPSY Left 1999  . BREAST LUMPECTOMY WITH RADIOACTIVE SEED LOCALIZATION Right 09/28/2017   Procedure: RIGHT BREAST LUMPECTOMY WITH RADIOACTIVE SEED LOCALIZATION;  Surgeon: Erroll Luna, MD;  Location: [place] [facility];  Service: General;  Laterality: Right;  . BREAST SURGERY    . KNEE ARTHROSCOPY Left   . MIDDLE EAR SURGERY Left 2011  . THYROIDECTOMY  1974   goiter--total thyroidectomy  . TONSILLECTOMY  1972  . WRIST FRACTURE SURGERY       OB History   None      Home Medications    Prior to Admission medications   Medication Sig Start Date End Date Taking? Authorizing Provider  colchicine 0.6 MG tablet Take 1 tablet (0.6 mg total) by mouth 2 (two) times daily. 02/02/18 03/04/18  Amin, Jeanella Flattery, MD  diclofenac sodium (VOLTAREN) 1 % GEL Apply 2 g topically 4 (four) times daily. 02/26/18   Keyen Marban, Bea Graff, PA-C  furosemide (LASIX) 40 MG tablet Take 40 mg by mouth daily.  09/25/17   [provider]  methocarbamol (ROBAXIN) 500 MG tablet Take 1 tablet (500 mg total) by mouth 2 (two) times daily. 02/26/18   Liliani Bobo, Bea Graff, PA-C  pantoprazole (PROTONIX) 40 MG tablet Take 1 tablet (40 mg  total) by mouth daily before breakfast. 02/02/18 03/04/18  Amin, Jeanella Flattery, MD  rosuvastatin (CRESTOR) 20 MG tablet Take 20 mg by mouth daily.    [provider]  spironolactone (ALDACTONE) 25 MG tablet TAKE ONE TABLET BY MOUTH DAILY Patient taking differently: Take 25 mg by mouth daily.  01/07/18   Glendale Chard, MD  SYNTHROID 88 MCG tablet Take 1 tablet (88 mcg total) by mouth daily. 02/16/18   Glendale Chard, MD  valsartan (DIOVAN) 160 MG tablet TAKE ONE TABLET BY MOUTH DAILY Patient taking differently: Take 160 mg by mouth daily.  01/07/18   Glendale Chard, MD    Family History Family History  Problem Relation Age of Onset  . Breast cancer Sister 38  . Thyroid cancer Mother   . Uterine cancer Paternal Grandmother  31    Social History Social History   Tobacco Use  . Smoking status: Former Research scientist (life sciences)  . Smokeless tobacco: Never Used  Substance Use Topics  . Alcohol use: Yes    Comment: wine 3x/wk  . Drug use: No     Allergies   Sulfa antibiotics   Review of Systems Review of Systems  Constitutional: Negative for chills and fever.  HENT: Negative for facial swelling and sore throat.   Respiratory: Negative for shortness of breath.   Cardiovascular: Negative for chest pain.  Gastrointestinal: Negative for abdominal pain, nausea and vomiting.  Genitourinary: Negative for dysuria.  Musculoskeletal: Positive for back pain.  Skin: Negative for rash and wound.  Neurological: Negative for headaches.  Psychiatric/Behavioral: The patient is not nervous/anxious.      Physical Exam Updated Vital Signs BP 125/83 (BP Location: Right Arm)   Pulse 92   Temp 98.3 F (36.8 C) (Oral)   Resp 16   Ht 5\' 2"  (1.575 m)   Wt 84.4 kg   SpO2 99%   BMI 34.03 kg/m   Physical Exam  Constitutional: She appears well-developed and well-nourished. No distress.  HENT:  Head: Normocephalic and atraumatic.  Mouth/Throat: Oropharynx is clear and moist. No oropharyngeal exudate.  Eyes: Pupils are equal, round, and reactive to light. Conjunctivae are normal. Right eye exhibits no discharge. Left eye exhibits no discharge. No scleral icterus.  Neck: Normal range of motion. Neck supple. No thyromegaly present.  Cardiovascular: Normal rate, regular rhythm, normal heart sounds and intact distal pulses. Exam reveals no gallop and no friction rub.  No murmur heard. Pulmonary/Chest: Effort normal and breath sounds normal. No stridor. No respiratory distress. She has no wheezes. She has no rales.  Abdominal: Soft. Bowel sounds are normal. She exhibits no distension. There is no tenderness. There is no rebound and no guarding.  Musculoskeletal: She exhibits no edema.       Back:  No midline cervical, thoracic, lumbar  tenderness, but tenderness to the bilateral lumbar paraspinal muscles into the bilateral buttocks; pain reproduced bilaterally with straight leg raise  Lymphadenopathy:    She has no cervical adenopathy.  Neurological: She is alert. Coordination normal.  5/5 strength to bilateral lower extremities and normal sensation  Skin: Skin is warm and dry. No rash noted. She is not diaphoretic. No pallor.  Psychiatric: She has a normal mood and affect.  Nursing note and vitals reviewed.    ED Treatments / Results  Labs (all labs ordered are listed, but only abnormal results are displayed) Labs Reviewed - No data to display  EKG None  Radiology Dg Lumbar Spine Complete  Result Date: 02/26/2018 CLINICAL DATA:  Injured back  yesterday. Right-sided back pain and right leg pain. EXAM: LUMBAR SPINE - COMPLETE 4+ VIEW COMPARISON:  None. FINDINGS: Normal alignment of the lumbar vertebral bodies. Disc spaces and vertebral bodies are maintained. The facets are normally aligned. No pars defects. The visualized bony pelvis is intact. Aortic calcifications and probable calcified fibroid in the right pelvis. IMPRESSION: Normal alignment and no acute bony findings or significant degenerative changes. Electronically Signed   By: Marijo Sanes M.D.   On: 02/26/2018 12:02    Procedures Procedures (including critical care time)  Medications Ordered in ED Medications  ketorolac (TORADOL) 30 MG/ML injection 15 mg (15 mg Intramuscular Given 02/26/18 1134)     Initial Impression / Assessment and Plan / ED Course  I have reviewed the triage vital signs and the nursing notes.  Pertinent labs & imaging results that were available during my care of the patient were reviewed by me and considered in my medical decision making (see chart for details).     Patient with bilateral lumbar pain, worse with movement.  It started when she bent down to pick something up.  Normal neuro exam without focal deficits.  No urinary  symptoms.  Reproducible on palpation and straight leg raise.  Considering history of cancer x-ray was completed which shows normal alignment and no acute bony findings or significant degenerative changes.  Patient is feeling much better after IM Toradol in the ED.  Will discharge home with diclofenac gel, considering ibuprofen has been upsetting patient is stomach and she has been taking it a lot since pericarditis diagnosis.  We will also discharge home with Robaxin.  Ice and heat discussed.  Exercises and stretches discussed.  Patient to follow-up with PCP as needed.  Patient understands and agrees with plan.  Patient vitals stable throughout ED course and discharged in satisfactory condition.  Final Clinical Impressions(s) / ED Diagnoses   Final diagnoses:  Acute bilateral low back pain, unspecified whether sciatica present    ED Discharge Orders         Ordered    methocarbamol (ROBAXIN) 500 MG tablet  2 times daily     02/26/18 1220    diclofenac sodium (VOLTAREN) 1 % GEL  4 times daily     02/26/18 [address] [place] 02/26/18 1237    Hayden Rasmussen, MD 02/27/18 [phone]

## 2018-02-26 NOTE — ED Triage Notes (Signed)
Patient c/o lower bilateral back pain after feeling a pull in the muscle yesterday.

## 2018-02-26 NOTE — ED Notes (Signed)
ED Provider at bedside. 

## 2018-02-26 NOTE — ED Notes (Signed)
Pt has lower back pain that is painful with movement only. No s/s of swelling, pt states not tender to touch. States she felt "a pull" when she lifted something yesterday and pain was worse upon wakening this morning.

## 2018-02-26 NOTE — Discharge Instructions (Addendum)
Apply Voltaren gel 4 times daily as needed for your back pain.  You can continue taking Tylenol as prescribed over-the-counter.  Take Robaxin twice daily as needed for muscle pain or spasms.  Do not drive or operate machinery while taking this medication, as it may make you very sleepy.  Use ice and heat alternating 20 minutes on, 20 minutes off.  Attempt the exercises and stretches as tolerated.  Please follow-up with your doctor if your symptoms are not improving.  Please return the emergency department you develop any new or worsening symptoms.

## 2018-03-01 ENCOUNTER — Encounter

## 2018-03-01 ENCOUNTER — Ambulatory Visit: Payer: Federal, State, Local not specified - PPO | Admitting: Cardiology

## 2018-03-01 ENCOUNTER — Encounter: Payer: Self-pay | Admitting: Cardiology

## 2018-03-01 VITALS — BP 130/82 | HR 90 | Ht 65.0 in | Wt 184.8 lb

## 2018-03-01 DIAGNOSIS — I300 Acute nonspecific idiopathic pericarditis: Secondary | ICD-10-CM

## 2018-03-01 MED ORDER — PANTOPRAZOLE SODIUM 40 MG PO TBEC: 40.0000 mg | DELAYED_RELEASE_TABLET | Freq: Every day | ORAL | 3 refills | Status: DC

## 2018-03-01 MED ORDER — INDOMETHACIN 25 MG PO CAPS: 25.0000 mg | ORAL_CAPSULE | Freq: Three times a day (TID) | ORAL | 3 refills | Status: DC

## 2018-03-01 NOTE — Progress Notes (Signed)
03/01/2018 Stephanie Dudley   [birth year]  [id number]  Primary Physician Glendale Chard, MD Primary Cardiologist: Dr. Meda Coffee   Reason for Visit/CC: Post hospital f/u for idiopathic pericarditis   HPI:  Stephanie Dudley is a 62 y.o. female  with HTN, HLD, MVP, hypothyroidism and breast CA s/p ofright lumpectomy with radioactive seed who was recently admitted for positional, pleuritic CP, worse with deep inspiration. She had negative troponin, mildly elevated CRP at 3 and sedimentation rate of 22. Her EKG had diffuse ST elevations, she was started on empiric therapy for acute pericarditis and was started on ibuprofen and colchicine. Echo showed normal LVEF at 65-70%. There was no pericardial effusion.   She was discharged home on 600 mg of ibuprofen BID and colchicine 0.6 mg BID. She was advised to take ibuprofen x 2 weeks, which she did, but symptoms still persist, although slightly improved. Still with positional CP, pleuritic. She is still taking colchicine. She continues to note mild exertional dyspnea, improved since hospitalization. Not any worse. She denies syncope/ near syncope. She is normotensive w/ BP at 130/82. Pulse rate 90 bpm. She notes she did not feel good with ibuprofen. She feels drowsy/tired when she takes it at high doses.   Current Meds  Medication Sig  . colchicine 0.6 MG tablet Take 1 tablet (0.6 mg total) by mouth 2 (two) times daily.  . diclofenac sodium (VOLTAREN) 1 % GEL Apply 2 g topically 4 (four) times daily.  . furosemide (LASIX) 40 MG tablet Take 40 mg by mouth daily.   . methocarbamol (ROBAXIN) 500 MG tablet Take 1 tablet (500 mg total) by mouth 2 (two) times daily.  . pantoprazole (PROTONIX) 40 MG tablet Take 1 tablet (40 mg total) by mouth daily before breakfast.  . rosuvastatin (CRESTOR) 20 MG tablet Take 20 mg by mouth daily.  Marland Kitchen spironolactone (ALDACTONE) 25 MG tablet TAKE ONE TABLET BY MOUTH DAILY (Patient taking differently: Take 25 mg by mouth  daily. )  . SYNTHROID 88 MCG tablet Take 1 tablet (88 mcg total) by mouth daily.  . valsartan (DIOVAN) 160 MG tablet TAKE ONE TABLET BY MOUTH DAILY (Patient taking differently: Take 160 mg by mouth daily. )  . [DISCONTINUED] pantoprazole (PROTONIX) 40 MG tablet Take 1 tablet (40 mg total) by mouth daily before breakfast.   Allergies  Allergen Reactions  . Sulfa Antibiotics Swelling   Past Medical History:  Diagnosis Date  . Ankle swelling   . Arthritis   . Cancer ([place]) 08/2017   right breast cancer  . Complication of anesthesia    hard to wake up with thyroid surgery  . Family history of breast cancer   . Family history of thyroid cancer   . Family history of uterine cancer   . Hyperlipidemia   . Hypertension   . Hypothyroidism   . Mitral valve prolapse   . Peripheral edema    ankles, feet  . Thyroid disease    Family History  Problem Relation Age of Onset  . Breast cancer Sister 49  . Thyroid cancer Mother   . Uterine cancer Paternal Grandmother 52   Past Surgical History:  Procedure Laterality Date  . APPENDECTOMY  1994  . BREAST BIOPSY Left 1999  . BREAST LUMPECTOMY WITH RADIOACTIVE SEED LOCALIZATION Right 09/28/2017   Procedure: RIGHT BREAST LUMPECTOMY WITH RADIOACTIVE SEED LOCALIZATION;  Surgeon: Erroll Luna, MD;  Location: [facility];  Service: General;  Laterality: Right;  . BREAST SURGERY    .  KNEE ARTHROSCOPY Left   . MIDDLE EAR SURGERY Left 2011  . THYROIDECTOMY  1974   goiter--total thyroidectomy  . TONSILLECTOMY  1972  . WRIST FRACTURE SURGERY     Social History   Socioeconomic History  . Marital status: Married    Spouse name: Not on file  . Number of children: Not on file  . Years of education: Not on file  . Highest education level: Not on file  Occupational History  . Not on file  Social Needs  . Financial resource strain: Not on file  . Food insecurity:    Worry: Not on file    Inability: Not on file  . Transportation  needs:    Medical: Not on file    Non-medical: Not on file  Tobacco Use  . Smoking status: Former Research scientist (life sciences)  . Smokeless tobacco: Never Used  Substance and Sexual Activity  . Alcohol use: Yes    Comment: wine 3x/wk  . Drug use: No  . Sexual activity: Yes    Birth control/protection: Post-menopausal  Lifestyle  . Physical activity:    Days per week: Not on file    Minutes per session: Not on file  . Stress: Not on file  Relationships  . Social connections:    Talks on phone: Not on file    Gets together: Not on file    Attends religious service: Not on file    Active member of club or organization: Not on file    Attends meetings of clubs or organizations: Not on file    Relationship status: Not on file  . Intimate partner violence:    Fear of current or ex partner: Not on file    Emotionally abused: Not on file    Physically abused: Not on file    Forced sexual activity: Not on file  Other Topics Concern  . Not on file  Social History Narrative  . Not on file     Review of Systems: General: negative for chills, fever, night sweats or weight changes.  Cardiovascular: negative for chest pain, dyspnea on exertion, edema, orthopnea, palpitations, paroxysmal nocturnal dyspnea or shortness of breath Dermatological: negative for rash Respiratory: negative for cough or wheezing Urologic: negative for hematuria Abdominal: negative for nausea, vomiting, diarrhea, bright red blood per rectum, melena, or hematemesis Neurologic: negative for visual changes, syncope, or dizziness All other systems reviewed and are otherwise negative except as noted above.   Physical Exam:  Blood pressure 130/82, pulse 90, height 5\' 5"  (1.651 m), weight 184 lb 12.8 oz (83.8 kg), SpO2 97 %.  General appearance: alert, cooperative and no distress Neck: no carotid bruit and no JVD Lungs: clear to auscultation bilaterally Heart: regular rate and rhythm, S1, S2 normal, no murmur, click, rub or  gallop Extremities: extremities normal, atraumatic, no cyanosis or edema Pulses: 2+ and symmetric Skin: Skin color, texture, turgor normal. No rashes or lesions Neurologic: Grossly normal  EKG not performed -- personally reviewed   ASSESSMENT AND PLAN:   1. Idiopathic Pericarditis: pt has completed 2 week course of ibuprofen and continues to take colchicine, but continues to experience symptoms c/w pericarditis (positional, pleuritic CP). Her echo showed no pericardial effusion. She denies any change in dyspnea. No syncope/ near syncope. BP normotensive. Although she was compliant with ibuprofen, she did not like the way it made her feel (she also gets exteremly tired/ drowsy with high doses). We will try treating with indomethacin 25 mg TID for the next 2 weeks,  followed by a taper. Continue colchcine x 3 months. We will have her f/u for repeat assessment in 2 weeks. If symptoms resolve, we will taper her off indomethacin over a 1-2 week period. She was advised to notify us if any developments of significant dyspnea, syncope/ near syncope. If such symptoms were to arise, would get repeat echo to r/o development of pericardial effusion.     Follow-Up in 2 weeks.   Wilna Pennie Ladoris Gene, MHS [facility] 03/01/2018 4:10 PM

## 2018-03-01 NOTE — Patient Instructions (Signed)
Medication Instructions:  START INDOMETHACIN 25 mg 3 TIMES DAILY  If you need a refill on your cardiac medications before your next appointment, please call your pharmacy.   Lab work: NONE If you have labs (blood work) drawn today and your tests are completely normal, you will receive your results only by: Marland Kitchen MyChart Message (if you have MyChart) OR . A paper copy in the mail If you have any lab test that is abnormal or we need to change your treatment, we will call you to review the results.  Testing/Procedures: NONE  Follow-Up: 2 WEEKS WITH [facility], you and your health needs are our priority.  As part of our continuing mission to provide you with exceptional heart care, we have created designated Provider Care Teams.  These Care Teams include your primary Cardiologist (physician) and Advanced Practice Providers (APPs -  Physician Assistants and Nurse Practitioners) who all work together to provide you with the care you need, when you need it. .   Any Other Special Instructions Will Be Listed Below (If Applicable).

## 2018-03-09 ENCOUNTER — Observation Stay (HOSPITAL_COMMUNITY)
Admission: EM | Admit: 2018-03-09 | Discharge: 2018-03-10 | Disposition: A | Discharge status: Home / Self Care | Payer: Federal, State, Local not specified - PPO | Attending: Cardiovascular Disease | Admitting: Cardiovascular Disease

## 2018-03-09 ENCOUNTER — Emergency Department (HOSPITAL_COMMUNITY): Payer: Federal, State, Local not specified - PPO

## 2018-03-09 ENCOUNTER — Encounter (HOSPITAL_COMMUNITY): Payer: Self-pay

## 2018-03-09 DIAGNOSIS — Z79899 Other long term (current) drug therapy: Secondary | ICD-10-CM | POA: Insufficient documentation

## 2018-03-09 DIAGNOSIS — R079 Chest pain, unspecified: Secondary | ICD-10-CM | POA: Diagnosis not present

## 2018-03-09 DIAGNOSIS — I313 Pericardial effusion (noninflammatory): Secondary | ICD-10-CM | POA: Diagnosis not present

## 2018-03-09 DIAGNOSIS — E785 Hyperlipidemia, unspecified: Secondary | ICD-10-CM | POA: Diagnosis not present

## 2018-03-09 DIAGNOSIS — J9811 Atelectasis: Secondary | ICD-10-CM | POA: Insufficient documentation

## 2018-03-09 DIAGNOSIS — E039 Hypothyroidism, unspecified: Secondary | ICD-10-CM | POA: Insufficient documentation

## 2018-03-09 DIAGNOSIS — I319 Disease of pericardium, unspecified: Secondary | ICD-10-CM | POA: Diagnosis not present

## 2018-03-09 DIAGNOSIS — R0781 Pleurodynia: Secondary | ICD-10-CM

## 2018-03-09 DIAGNOSIS — R000 Tachycardia, unspecified: Secondary | ICD-10-CM | POA: Diagnosis not present

## 2018-03-09 DIAGNOSIS — I300 Acute nonspecific idiopathic pericarditis: Secondary | ICD-10-CM | POA: Diagnosis not present

## 2018-03-09 DIAGNOSIS — R0989 Other specified symptoms and signs involving the circulatory and respiratory systems: Secondary | ICD-10-CM | POA: Diagnosis not present

## 2018-03-09 DIAGNOSIS — Z882 Allergy status to sulfonamides status: Secondary | ICD-10-CM | POA: Diagnosis not present

## 2018-03-09 DIAGNOSIS — I119 Hypertensive heart disease without heart failure: Secondary | ICD-10-CM | POA: Insufficient documentation

## 2018-03-09 DIAGNOSIS — Z7951 Long term (current) use of inhaled steroids: Secondary | ICD-10-CM | POA: Insufficient documentation

## 2018-03-09 DIAGNOSIS — M546 Pain in thoracic spine: Secondary | ICD-10-CM | POA: Diagnosis not present

## 2018-03-09 DIAGNOSIS — Z87891 Personal history of nicotine dependence: Secondary | ICD-10-CM | POA: Diagnosis not present

## 2018-03-09 HISTORY — DX: Unspecified lump in the right breast, unspecified quadrant: N63.10

## 2018-03-09 LAB — BASIC METABOLIC PANEL
Anion gap: 12 (ref 5–15)
BUN: 6 mg/dL (ref 6–20)
CO2: 23 mmol/L (ref 22–32)
CREATININE: 0.88 mg/dL (ref 0.44–1.00)
Calcium: 9.1 mg/dL (ref 8.9–10.3)
Chloride: 103 mmol/L (ref 98–111)
GFR calc Af Amer: >60 mL/min (ref >60)
GFR calc non Af Amer: >60 mL/min (ref >60)
Glucose, Bld: 110 mg/dL — ABNORMAL HIGH (ref 70–99)
Potassium: 4.1 mmol/L (ref 3.5–5.1)
Sodium: 138 mmol/L (ref 135–145)

## 2018-03-09 LAB — CBC WITH DIFFERENTIAL/PLATELET
Abs Immature Granulocytes: 0.03 10*3/uL (ref 0.00–0.07)
Basophils Absolute: 0 10*3/uL (ref 0.0–0.1)
Basophils Relative: 0 %
EOS PCT: 0 %
Eosinophils Absolute: 0 10*3/uL (ref 0.0–0.5)
HEMATOCRIT: 32.6 % — AB (ref 36.0–46.0)
Hemoglobin: 10.2 g/dL — ABNORMAL LOW (ref 12.0–15.0)
Immature Granulocytes: 0 %
LYMPHS ABS: 1.7 10*3/uL (ref 0.7–4.0)
Lymphocytes Relative: 16 %
MCH: 28.9 pg (ref 26.0–34.0)
MCHC: 31.3 g/dL (ref 30.0–36.0)
MCV: 92.4 fL (ref 80.0–100.0)
Monocytes Absolute: 1 10*3/uL (ref 0.1–1.0)
Monocytes Relative: 9 %
Neutro Abs: 7.8 10*3/uL — ABNORMAL HIGH (ref 1.7–7.7)
Neutrophils Relative %: 75 %
Platelets: 341 10*3/uL (ref 150–400)
RBC: 3.53 MIL/uL — ABNORMAL LOW (ref 3.87–5.11)
RDW: 13.8 % (ref 11.5–15.5)
WBC: 10.5 10*3/uL (ref 4.0–10.5)
nRBC: 0 % (ref 0.0–0.2)

## 2018-03-09 LAB — D-DIMER, QUANTITATIVE: D-Dimer, Quant: 1.9 ug/mL-FEU — ABNORMAL HIGH (ref 0.00–0.50)

## 2018-03-09 LAB — I-STAT TROPONIN, ED: Troponin i, poc: 0 ng/mL (ref 0.00–0.08)

## 2018-03-09 LAB — SEDIMENTATION RATE: Sed Rate: 70 mm/hr — ABNORMAL HIGH (ref 0–22)

## 2018-03-09 LAB — TSH: TSH: 0.268 u[IU]/mL — ABNORMAL LOW (ref 0.350–4.500)

## 2018-03-09 MED ORDER — KETOROLAC TROMETHAMINE 30 MG/ML IJ SOLN
30.0000 mg | Freq: Once | INTRAMUSCULAR | Status: AC
  Administered 2018-03-09: 30 mg via INTRAVENOUS
  Filled 2018-03-09: qty 1

## 2018-03-09 MED ORDER — IRBESARTAN 150 MG PO TABS
150.0000 mg | ORAL_TABLET | Freq: Every day | ORAL | Status: DC
  Administered 2018-03-09 – 2018-03-10 (×2): 150 mg via ORAL
  Filled 2018-03-09 (×2): qty 1

## 2018-03-09 MED ORDER — ONDANSETRON HCL 4 MG/2ML IJ SOLN: 4.0000 mg | Freq: Four times a day (QID) | INTRAMUSCULAR | Status: DC | PRN

## 2018-03-09 MED ORDER — PREDNISONE 20 MG PO TABS: 20.0000 mg | ORAL_TABLET | Freq: Every day | ORAL | Status: DC

## 2018-03-09 MED ORDER — IOPAMIDOL (ISOVUE-370) INJECTION 76%
75.0000 mL | Freq: Once | INTRAVENOUS | Status: AC | PRN
  Administered 2018-03-09: 75 mL via INTRAVENOUS

## 2018-03-09 MED ORDER — PANTOPRAZOLE SODIUM 40 MG PO TBEC
40.0000 mg | DELAYED_RELEASE_TABLET | Freq: Every day | ORAL | Status: DC
  Administered 2018-03-10: 40 mg via ORAL
  Filled 2018-03-09: qty 1

## 2018-03-09 MED ORDER — PREDNISONE 20 MG PO TABS: 40.0000 mg | ORAL_TABLET | Freq: Every day | ORAL | Status: DC

## 2018-03-09 MED ORDER — ROSUVASTATIN CALCIUM 20 MG PO TABS
20.0000 mg | ORAL_TABLET | Freq: Every day | ORAL | Status: DC
  Administered 2018-03-09 – 2018-03-10 (×2): 20 mg via ORAL
  Filled 2018-03-09 (×2): qty 1

## 2018-03-09 MED ORDER — PREDNISONE 10 MG PO TABS: 10.0000 mg | ORAL_TABLET | Freq: Every day | ORAL | Status: DC

## 2018-03-09 MED ORDER — NITROGLYCERIN 0.4 MG SL SUBL: 0.4000 mg | SUBLINGUAL_TABLET | SUBLINGUAL | Status: DC | PRN

## 2018-03-09 MED ORDER — COLCHICINE 0.6 MG PO TABS
0.6000 mg | ORAL_TABLET | Freq: Two times a day (BID) | ORAL | Status: DC
  Administered 2018-03-09 – 2018-03-10 (×2): 0.6 mg via ORAL
  Filled 2018-03-09 (×3): qty 1

## 2018-03-09 MED ORDER — TRAMADOL HCL 50 MG PO TABS: 50.0000 mg | ORAL_TABLET | Freq: Four times a day (QID) | ORAL | Status: DC | PRN

## 2018-03-09 MED ORDER — ACETAMINOPHEN 325 MG PO TABS: 650.0000 mg | ORAL_TABLET | ORAL | Status: DC | PRN

## 2018-03-09 MED ORDER — SPIRONOLACTONE 25 MG PO TABS
25.0000 mg | ORAL_TABLET | Freq: Every day | ORAL | Status: DC
  Administered 2018-03-09 – 2018-03-10 (×2): 25 mg via ORAL
  Filled 2018-03-09 (×2): qty 1

## 2018-03-09 MED ORDER — FUROSEMIDE 40 MG PO TABS
40.0000 mg | ORAL_TABLET | Freq: Every day | ORAL | Status: DC
  Administered 2018-03-09 – 2018-03-10 (×2): 40 mg via ORAL
  Filled 2018-03-09 (×2): qty 1

## 2018-03-09 MED ORDER — PREDNISONE 20 MG PO TABS
60.0000 mg | ORAL_TABLET | Freq: Every day | ORAL | Status: DC
  Administered 2018-03-09 – 2018-03-10 (×2): 60 mg via ORAL
  Filled 2018-03-09 (×2): qty 3

## 2018-03-09 MED ORDER — IOPAMIDOL (ISOVUE-370) INJECTION 76%
INTRAVENOUS | Status: AC
  Filled 2018-03-09: qty 100

## 2018-03-09 MED ORDER — LEVOTHYROXINE SODIUM 88 MCG PO TABS
88.0000 ug | ORAL_TABLET | Freq: Every day | ORAL | Status: DC
  Administered 2018-03-10: 88 ug via ORAL
  Filled 2018-03-09: qty 1

## 2018-03-09 NOTE — Progress Notes (Signed)
Patient to unit from ED in stable condition without chest pain at this time. Telemetry applied and CCMD notified. CHG bath given. Husband at bedside and patient oriented to room and unit.

## 2018-03-09 NOTE — ED Triage Notes (Signed)
Pt from home c/o R sided CP that started yesterday; increases with breathing, movement, and palpatation; seen for similar last month, diagnosed w/ pericarditis

## 2018-03-09 NOTE — ED Notes (Signed)
attempted report 

## 2018-03-09 NOTE — ED Provider Notes (Signed)
[facility] EMERGENCY DEPARTMENT Provider Note   CSN: [id number] Arrival date & time: 03/09/18  0350     History   Chief Complaint Chief Complaint  Patient presents with  . Chest Pain    HPI Stephanie Dudley is a 62 y.o. female.  62 year old female with prior medical history as detailed below presents for evaluation of right-sided chest discomfort.  Patient reports right-sided chest pain and right-sided thoracic back pain.  This discomfort started yesterday afternoon.  She has not taken anything for this.  She does report a recent diagnosis of pericarditis.  This pain is similar to her prior discomfort secondary to her pericarditis however her pain is more on the right versus previously had been on the left.  She denies associated fever or shortness of breath.  She denies nausea, vomiting, diaphoresis, or other acute complaint.  She is pleasant and appears to be in no acute distress during my exam.\  She does report that she is still taking "medications" for her prior diagnosis of pericarditis.  She cannot tell me exactly what they are.  The history is provided by the patient and medical records.  Chest Pain   This is a new problem. The current episode started 6 to 12 hours ago. The problem occurs constantly. The problem has not changed since onset.The pain is associated with movement, raising an arm, breathing and coughing. The pain is present in the lateral region. The pain is mild. The quality of the pain is described as sharp and pleuritic. The pain does not radiate. She has tried nothing for the symptoms.    Past Medical History:  Diagnosis Date  . Ankle swelling   . Arthritis   . Cancer ([place]) 08/2017   right breast cancer  . Complication of anesthesia    hard to wake up with thyroid surgery  . Family history of breast cancer   . Family history of thyroid cancer   . Family history of uterine cancer   . Hyperlipidemia   . Hypertension   .  Hypothyroidism   . Mitral valve prolapse   . Peripheral edema    ankles, feet  . Thyroid disease     Patient Active Problem List   Diagnosis Date Noted  . Breast cancer ([place]) 02/02/2018  . Chest pain 02/02/2018  . Pericarditis 02/02/2018  . Hyperlipidemia   . Hypertension   . Hypothyroidism   . Ankle swelling   . Genetic testing 11/08/2017  . Family history of breast cancer   . Family history of thyroid cancer   . Family history of uterine cancer     Past Surgical History:  Procedure Laterality Date  . APPENDECTOMY  1994  . BREAST BIOPSY Left 1999  . BREAST LUMPECTOMY WITH RADIOACTIVE SEED LOCALIZATION Right 09/28/2017   Procedure: RIGHT BREAST LUMPECTOMY WITH RADIOACTIVE SEED LOCALIZATION;  Surgeon: Erroll Luna, MD;  Location: [place];  Service: General;  Laterality: Right;  . BREAST SURGERY    . KNEE ARTHROSCOPY Left   . MIDDLE EAR SURGERY Left 2011  . THYROIDECTOMY  1974   goiter--total thyroidectomy  . TONSILLECTOMY  1972  . WRIST FRACTURE SURGERY       OB History   No obstetric history on file.      Home Medications    Prior to Admission medications   Medication Sig Start Date End Date Taking? Authorizing Provider  colchicine 0.6 MG tablet Take 1 tablet (0.6 mg total) by mouth 2 (two) times  daily. 02/02/18 03/04/18  Damita Lack, MD  diclofenac sodium (VOLTAREN) 1 % GEL Apply 2 g topically 4 (four) times daily. 02/26/18   Law, Bea Graff, PA-C  furosemide (LASIX) 40 MG tablet Take 40 mg by mouth daily.  09/25/17   [provider]  indomethacin (INDOCIN) 25 MG capsule Take 1 capsule (25 mg total) by mouth 3 (three) times daily. 03/01/18   Lyda Jester M, PA-C  methocarbamol (ROBAXIN) 500 MG tablet Take 1 tablet (500 mg total) by mouth 2 (two) times daily. 02/26/18   Law, Bea Graff, PA-C  pantoprazole (PROTONIX) 40 MG tablet Take 1 tablet (40 mg total) by mouth daily before breakfast. 03/01/18 03/31/18  Lyda Jester M,  PA-C  rosuvastatin (CRESTOR) 20 MG tablet Take 20 mg by mouth daily.    [provider]  spironolactone (ALDACTONE) 25 MG tablet TAKE ONE TABLET BY MOUTH DAILY Patient taking differently: Take 25 mg by mouth daily.  01/07/18   Glendale Chard, MD  SYNTHROID 88 MCG tablet Take 1 tablet (88 mcg total) by mouth daily. 02/16/18   Glendale Chard, MD  valsartan (DIOVAN) 160 MG tablet TAKE ONE TABLET BY MOUTH DAILY Patient taking differently: Take 160 mg by mouth daily.  01/07/18   Glendale Chard, MD    Family History Family History  Problem Relation Age of Onset  . Breast cancer Sister 35  . Thyroid cancer Mother   . Uterine cancer Paternal Grandmother 10    Social History Social History   Tobacco Use  . Smoking status: Former Research scientist (life sciences)  . Smokeless tobacco: Never Used  Substance Use Topics  . Alcohol use: Yes    Comment: wine 2x/wk  . Drug use: No     Allergies   Sulfa antibiotics   Review of Systems Review of Systems  Cardiovascular: Positive for chest pain.  All other systems reviewed and are negative.    Physical Exam Updated Vital Signs BP (!) 138/98   Pulse (!) 110   Temp 99.3 F (37.4 C) (Oral)   Resp (!) 21   Ht 5\' 5"  (1.651 m)   Wt 82.1 kg   SpO2 99%   BMI 30.12 kg/m   Physical Exam Vitals signs and nursing note reviewed.  Constitutional:      General: She is not in acute distress.    Appearance: She is well-developed.  HENT:     Head: Normocephalic and atraumatic.  Eyes:     Conjunctiva/sclera: Conjunctivae normal.     Pupils: Pupils are equal, round, and reactive to light.  Neck:     Musculoskeletal: Normal range of motion and neck supple.  Cardiovascular:     Rate and Rhythm: Normal rate and regular rhythm.     Heart sounds: Normal heart sounds.  Pulmonary:     Effort: Pulmonary effort is normal. No respiratory distress.     Breath sounds: Normal breath sounds.     Comments: Palpation of the anterior right chest wall does elicit  tenderness.  This appears to reproduce the patient's reported symptoms. Abdominal:     General: There is no distension.     Palpations: Abdomen is soft.     Tenderness: There is no abdominal tenderness.  Musculoskeletal: Normal range of motion.        General: No deformity.  Skin:    General: Skin is warm and dry.  Neurological:     Mental Status: She is alert and oriented to person, place, and time.  ED Treatments / Results  Labs (all labs ordered are listed, but only abnormal results are displayed) Labs Reviewed  BASIC METABOLIC PANEL - Abnormal; Notable for the following components:      Result Value   Glucose, Bld 110 (*)    All other components within normal limits  CBC WITH DIFFERENTIAL/PLATELET - Abnormal; Notable for the following components:   RBC 3.53 (*)    Hemoglobin 10.2 (*)    HCT 32.6 (*)    Neutro Abs 7.8 (*)    All other components within normal limits  D-DIMER, QUANTITATIVE (NOT AT [facility]) - Abnormal; Notable for the following components:   D-Dimer, Quant 1.90 (*)    All other components within normal limits  I-STAT TROPONIN, ED    EKG EKG Interpretation  Date/Time:  Wednesday March 09 2018 08:29:26 EST Ventricular Rate:  107 PR Interval:    QRS Duration: 64 QT Interval:  360 QTC Calculation: 481 R Axis:   63 Text Interpretation:  Sinus tachycardia Atrial premature complex Consider right atrial enlargement Low voltage, precordial leads Nonspecific T abnrm, anterolateral leads Baseline wander in lead(s) II III aVR aVL aVF V5 Confirmed by Dene Gentry [phone]) on 03/09/2018 8:33:32 AM   Radiology Dg Chest 2 View  Result Date: 03/09/2018 CLINICAL DATA:  Onset of right-sided chest pain yesterday with pleuritic component as well as positional component. There is palpable chest discomfort. Similar symptoms last month at which time the patient was diagnosed with pericarditis. EXAM: CHEST - 2 VIEW COMPARISON:  PA and lateral chest x-ray of February 01, 2018 and CT scan of the chest of the same date FINDINGS: The lungs are reasonably well inflated. Linear increased density as developed at both bases slightly greater on the left. There is no pleural effusion or pneumothorax. The cardiac silhouette is enlarged. The pulmonary vascularity is not engorged. There is no pleural effusion. The mediastinum is normal in width. The bony thorax exhibits no acute abnormality. IMPRESSION: Bibasilar atelectasis new since the previous study. No pleural effusion or alveolar infiltrate. Stable enlargement of the cardiac silhouette which could reflect an underlying pericardial effusion. No overt pulmonary edema. Electronically Signed   By: David  Martinique M.D.   On: 03/09/2018 09:29   Ct Angio Chest Pe W And/or Wo Contrast  Result Date: 03/09/2018 CLINICAL DATA:  Left chest pain EXAM: CT ANGIOGRAPHY CHEST WITH CONTRAST TECHNIQUE: Multidetector CT imaging of the chest was performed using the standard protocol during bolus administration of intravenous contrast. Multiplanar CT image reconstructions and MIPs were obtained to evaluate the vascular anatomy. CONTRAST:  21mL ISOVUE-370 IOPAMIDOL (ISOVUE-370) INJECTION 76% COMPARISON:  02/01/2018 FINDINGS: Cardiovascular: No filling defects in the pulmonary arteries to suggest pulmonary emboli. Mild cardiomegaly. Aorta is normal caliber. Small pericardial effusion. Mediastinum/Nodes: No mediastinal, hilar, or axillary adenopathy. Lungs/Pleura: Linear bibasilar atelectasis. Mild vascular congestion. No effusions. Upper Abdomen: Imaging into the upper abdomen shows no acute findings. Musculoskeletal: Chest wall soft tissues are unremarkable. No acute bony abnormality. Review of the MIP images confirms the above findings. IMPRESSION: No evidence of pulmonary embolus. Cardiomegaly, vascular congestion. Small pericardial effusion. Bibasilar atelectasis. Electronically Signed   By: Rolm Baptise M.D.   On: 03/09/2018 09:54     Procedures Procedures (including critical care time)  Medications Ordered in ED Medications - No data to display   Initial Impression / Assessment and Plan / ED Course  I have reviewed the triage vital signs and the nursing notes.  Pertinent labs & imaging results that were available  during my care of the patient were reviewed by me and considered in my medical decision making (see chart for details).     MDM  Screen complete  Patient is presenting for persistent pleuritic chest pain.  She has been diagnosed with pericarditis and treated for same.  She presents today complaining of worsening symptoms.  CTA of chest suggests the possibility of a pericardial effusion which may be new.  Cardiology is aware of case and will evaluate for possible admission.  Final Clinical Impressions(s) / ED Diagnoses   Final diagnoses:  Pleuritic chest pain    ED Discharge Orders    None       Valarie Merino, MD 03/09/18 1327

## 2018-03-09 NOTE — H&P (Addendum)
Cardiology Admission History and Physical:   Patient ID: Stephanie Dudley MRN: [id number]; DOB: 1955-06-24   Admission date: 03/09/2018  Primary Care Provider: Glendale Chard, MD Primary Cardiologist: Dr. Meda Coffee   Chief Complaint: Chest pain   Patient Profile:   Stephanie Dudley is a 62 y.o. 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   Admitted 01/2017 for positional, pleuritic CP, worse with deep inspiration. She had negative troponin, mildly elevated CRP at 3 and sedimentation rate of 22. Her EKG had diffuse ST elevations, she was started on empiric therapy for acute pericarditis and was started on ibuprofen and colchicine. Echo showed normal LVEF at 65-70%. There was no pericardial effusion.   Seen by Lyda Jester 03/01/18 for follow up.  She had a completed 2-week course of ibuprofen.  Symptoms improved but never resolved. Started on indomethacin 25 mg TID for the next 2 weeks, followed by a taper. Continued colchcine x 3 months.  History of Present Illness:   Stephanie Dudley continues to have ongoing chest discomfort but it worsened since yesterday.  She is presenting with right-sided sharp chest pain which is worse with any position and deep breath.  During last admission she had a left-sided pain.  She sleeps chronically on multiple pillows.  Denies lower extremity edema, palpitation, dizziness or melena.  Compliant with medication.  Her pain exacerbated overnight and came to ER for further evaluation.  Noted elevated d-dimer at 1.9.  CTA of the chest negative for PE but showed vascular congestion and small pericardial effusion.  Point-of-care troponin negative.  Electrolyte and creatinine normal.  EKG shows sinus tachycardia at rate of 107 bpm and nonspecific T wave abnormality anteriorly-slightly reviewed.  Telemetry shows sinus tach at 110s.   Past Medical  History:  Diagnosis Date  . Ankle swelling   . Arthritis   . Breast mass, right 08/2017   Benign tissue  . Complication of anesthesia    hard to wake up with thyroid surgery  . Family history of breast cancer   . Family history of thyroid cancer   . Family history of uterine cancer   . Hyperlipidemia   . Hypertension   . Hypothyroidism   . Mitral valve prolapse   . Peripheral edema    ankles, feet  . Thyroid disease     Past Surgical History:  Procedure Laterality Date  . APPENDECTOMY  1994  . BREAST BIOPSY Left 1999  . BREAST LUMPECTOMY WITH RADIOACTIVE SEED LOCALIZATION Right 09/28/2017   Procedure: RIGHT BREAST LUMPECTOMY WITH RADIOACTIVE SEED LOCALIZATION;  Surgeon: Erroll Luna, MD;  Location: [facility];  Service: General;  Laterality: Right;  . BREAST SURGERY    . KNEE ARTHROSCOPY Left   . MIDDLE EAR SURGERY Left 2011  . THYROIDECTOMY  1974   goiter--total thyroidectomy  . TONSILLECTOMY  1972  . WRIST FRACTURE SURGERY       Medications Prior to Admission: Prior to Admission medications   Medication Sig Start Date End Date Taking? Authorizing Provider  colchicine 0.6 MG tablet Take 1 tablet (0.6 mg total) by mouth 2 (two) times daily. 02/02/18 03/09/18 Yes Amin, Jeanella Flattery, MD  diclofenac sodium (VOLTAREN) 1 % GEL Apply 2 g topically 4 (four) times daily. 02/26/18  Yes Law, Bea Graff, PA-C  furosemide (LASIX) 40 MG tablet Take 40 mg by mouth daily.  09/25/17  Yes [provider]  indomethacin (INDOCIN) 25  MG capsule Take 1 capsule (25 mg total) by mouth 3 (three) times daily. 03/01/18  Yes Lyda Jester M, PA-C  methocarbamol (ROBAXIN) 500 MG tablet Take 1 tablet (500 mg total) by mouth 2 (two) times daily. 02/26/18  Yes Law, [place], PA-C  pantoprazole (PROTONIX) 40 MG tablet Take 1 tablet (40 mg total) by mouth daily before breakfast. 03/01/18 03/31/18 Yes Rosita Fire, Brittainy M, PA-C  rosuvastatin (CRESTOR) 20 MG tablet Take 20 mg by  mouth daily.   Yes [provider]  spironolactone (ALDACTONE) 25 MG tablet TAKE ONE TABLET BY MOUTH DAILY Patient taking differently: Take 25 mg by mouth daily.  01/07/18  Yes Glendale Chard, MD  SYNTHROID 88 MCG tablet Take 1 tablet (88 mcg total) by mouth daily. 02/16/18  Yes Glendale Chard, MD  valsartan (DIOVAN) 160 MG tablet TAKE ONE TABLET BY MOUTH DAILY Patient taking differently: Take 160 mg by mouth daily.  01/07/18  Yes Glendale Chard, MD     Allergies:    Allergies  Allergen Reactions  . Sulfa Antibiotics Swelling    Social History:   Social History   Socioeconomic History  . Marital status: Married    Spouse name: Not on file  . Number of children: Not on file  . Years of education: Not on file  . Highest education level: Not on file  Occupational History  . Not on file  Social Needs  . Financial resource strain: Not on file  . Food insecurity:    Worry: Not on file    Inability: Not on file  . Transportation needs:    Medical: Not on file    Non-medical: Not on file  Tobacco Use  . Smoking status: Former Research scientist (life sciences)  . Smokeless tobacco: Never Used  Substance and Sexual Activity  . Alcohol use: Yes    Comment: wine 2x/wk  . Drug use: No  . Sexual activity: Yes    Birth control/protection: Post-menopausal  Lifestyle  . Physical activity:    Days per week: Not on file    Minutes per session: Not on file  . Stress: Not on file  Relationships  . Social connections:    Talks on phone: Not on file    Gets together: Not on file    Attends religious service: Not on file    Active member of club or organization: Not on file    Attends meetings of clubs or organizations: Not on file    Relationship status: Not on file  . Intimate partner violence:    Fear of current or ex partner: Not on file    Emotionally abused: Not on file    Physically abused: Not on file    Forced sexual activity: Not on file  Other Topics Concern  . Not on file  Social  History Narrative  . Not on file    Family History:   The patient's family history includes Breast cancer (age of onset: 9) in her sister; Thyroid cancer in her mother; Uterine cancer (age of onset: 63) in her paternal grandmother.    ROS:  Please see the history of present illness.  All other ROS reviewed and negative.     Physical Exam/Data:   Vitals:   03/09/18 1000 03/09/18 1030 03/09/18 1045 03/09/18 1243  BP: (!) 144/88 139/87 133/87 (!) 144/91  Pulse: (!) 101 (!) 104 (!) 103 (!) 111  Resp: (!) '26  18 19  '$ Temp:      TempSrc:  SpO2: 98% 96% 96% 95%  Weight:      Height:       No intake or output data in the 24 hours ending 03/09/18 1336 Filed Weights   03/09/18 0830  Weight: 82.1 kg   Body mass index is 30.12 kg/m.  General:  Well nourished, well developed, in no acute distress HEENT: normal Lymph: no adenopathy Neck: no JVD Endocrine:  No thryomegaly Vascular: No carotid bruits; FA pulses 2+ bilaterally without bruits  Cardiac:  normal S1, S2; RRR; no murmur .  Tender to palpation at right upper chest Lungs:  clear to auscultation bilaterally, no wheezing, rhonchi or rales  Abd: soft, nontender, no hepatomegaly  Ext: no  edema Musculoskeletal:  No deformities, BUE and BLE strength normal and equal Skin: warm and dry  Neuro:  CNs 2-12 intact, no focal abnormalities noted Psych:  Normal affect   Relevant CV Studies:  Echo 02/02/18 Study Conclusions  - Left ventricle: The cavity size was normal. Wall thickness was   increased in a pattern of mild LVH. Systolic function was   vigorous. The estimated ejection fraction was in the range of 65%   to 70%. Wall motion was normal; there were no regional wall   motion abnormalities. Left ventricular diastolic function   parameters were normal for the patient&'s age. - Pulmonary arteries: PA peak pressure: 31 mm Hg (S).   Laboratory Data:  Chemistry Recent Labs  Lab 03/09/18 0833  NA 138  K 4.1  CL  103  CO2 23  GLUCOSE 110*  BUN 6  CREATININE 0.88  CALCIUM 9.1  GFRNONAA >60  GFRAA >60  ANIONGAP 12    No results for input(s): PROT, ALBUMIN, AST, ALT, ALKPHOS, BILITOT in the last 168 hours. Hematology Recent Labs  Lab 03/09/18 0833  WBC 10.5  RBC 3.53*  HGB 10.2*  HCT 32.6*  MCV 92.4  MCH 28.9  MCHC 31.3  RDW 13.8  PLT 341   Cardiac EnzymesNo results for input(s): TROPONINI in the last 168 hours.  Recent Labs  Lab 03/09/18 0840  TROPIPOC 0.00    BNPNo results for input(s): BNP, PROBNP in the last 168 hours.  DDimer  Recent Labs  Lab 03/09/18 [id number]  DDIMER 1.90*    Radiology/Studies:  Dg Chest 2 View  Result Date: 03/09/2018 CLINICAL DATA:  Onset of right-sided chest pain yesterday with pleuritic component as well as positional component. There is palpable chest discomfort. Similar symptoms last month at which time the patient was diagnosed with pericarditis. EXAM: CHEST - 2 VIEW COMPARISON:  PA and lateral chest x-ray of February 01, 2018 and CT scan of the chest of the same date FINDINGS: The lungs are reasonably well inflated. Linear increased density as developed at both bases slightly greater on the left. There is no pleural effusion or pneumothorax. The cardiac silhouette is enlarged. The pulmonary vascularity is not engorged. There is no pleural effusion. The mediastinum is normal in width. The bony thorax exhibits no acute abnormality. IMPRESSION: Bibasilar atelectasis new since the previous study. No pleural effusion or alveolar infiltrate. Stable enlargement of the cardiac silhouette which could reflect an underlying pericardial effusion. No overt pulmonary edema. Electronically Signed   By: David  Martinique M.D.   On: 03/09/2018 09:29   Ct Angio Chest Pe W And/or Wo Contrast  Result Date: 03/09/2018 CLINICAL DATA:  Left chest pain EXAM: CT ANGIOGRAPHY CHEST WITH CONTRAST TECHNIQUE: Multidetector CT imaging of the chest was performed using the standard  protocol  during bolus administration of intravenous contrast. Multiplanar CT image reconstructions and MIPs were obtained to evaluate the vascular anatomy. CONTRAST:  76m ISOVUE-370 IOPAMIDOL (ISOVUE-370) INJECTION 76% COMPARISON:  02/01/2018 FINDINGS: Cardiovascular: No filling defects in the pulmonary arteries to suggest pulmonary emboli. Mild cardiomegaly. Aorta is normal caliber. Small pericardial effusion. Mediastinum/Nodes: No mediastinal, hilar, or axillary adenopathy. Lungs/Pleura: Linear bibasilar atelectasis. Mild vascular congestion. No effusions. Upper Abdomen: Imaging into the upper abdomen shows no acute findings. Musculoskeletal: Chest wall soft tissues are unremarkable. No acute bony abnormality. Review of the MIP images confirms the above findings. IMPRESSION: No evidence of pulmonary embolus. Cardiomegaly, vascular congestion. Small pericardial effusion. Bibasilar atelectasis. Electronically Signed   By: KRolm BaptiseM.D.   On: 03/09/2018 09:54    Assessment and Plan:   1. pericarditis -She was treated with 2 weeks of ibuprofen and then indomethacin 25 mg 3 times daily since 12/10.  Continued colchicine.  Symptoms improved but never went away.  Now presenting with pleuritic right-sided chest pain.  She had left-sided pain previously.   Elevated d-dimer however CTA of the chest negative for PE.  It shows vascular congestion and small pericardial effusion.  She is tachycardic with stable blood pressure.    Severity of Illness: The appropriate patient status for this patient is OBSERVATION. Observation status is judged to be reasonable and necessary in order to provide the required intensity of service to ensure the patient's safety. The patient's presenting symptoms, physical exam findings, and initial radiographic and laboratory data in the context of their medical condition is felt to place them at decreased risk for further clinical deterioration. Furthermore, it is anticipated that the  patient will be medically stable for discharge from the hospital within 2 midnights of admission. The following factors support the patient status of observation.   " The patient's presenting symptoms include Chest pain . " The physical exam findings include none  " The initial radiographic and laboratory data are none     For questions or updates, please contact CTaylorPlease consult [email or web address] for contact info under        Signed, MSanda Klein MD  03/09/2018 1:36 PM   I have seen and examined the patient along with BLeanor Kail PTwilight.  I have reviewed the chart, notes and new data.  I agree with PA/NP's note.  Key new complaints: The location of her discomfort has moved to the right side of her chest, but otherwise has identical pleuritic complaints.  Sharp pain is positional, worsened by lying on her back or on her right side, worsened by breathing or coughing. Key examination changes: No evidence of jugular venous distention, normal blood pressure, minimally tachycardic, I cannot hear a pericardial rub Key new findings / data: ECG no longer shows ST segment elevation but does have subtle involuted changes in the inferior leads and anterior precordial leads with slightly negative T waves.  CT angiogram of the chest does not show evidence of any meaningful abnormalities other than a small pericardial effusion.  PLAN: We will replace nonsteroidal anti-inflammatory drugs with steroids. Recheck ESR and screening studies for autoimmune disorders, since she has recurrent symptoms.  She has previously taken corticosteroids for joint problems, but she believes that this was osteoarthritis rather than inflammatory arthritis. Continue colchicine. She is in quite a bit of discomfort right now would prefer to be observed overnight in the hospital.  MSanda Klein MD, FDauphin([phone]/18/2019, 1:36 PM

## 2018-03-09 NOTE — ED Notes (Signed)
Patient transported to X-ray 

## 2018-03-10 ENCOUNTER — Other Ambulatory Visit: Payer: Self-pay

## 2018-03-10 DIAGNOSIS — I300 Acute nonspecific idiopathic pericarditis: Secondary | ICD-10-CM | POA: Diagnosis not present

## 2018-03-10 LAB — BASIC METABOLIC PANEL
Anion gap: 12 (ref 5–15)
BUN: 10 mg/dL (ref 6–20)
CHLORIDE: 102 mmol/L (ref 98–111)
CO2: 23 mmol/L (ref 22–32)
CREATININE: 0.82 mg/dL (ref 0.44–1.00)
Calcium: 9.1 mg/dL (ref 8.9–10.3)
GFR calc Af Amer: >60 mL/min (ref >60)
GFR calc non Af Amer: >60 mL/min (ref >60)
Glucose, Bld: 235 mg/dL — ABNORMAL HIGH (ref 70–99)
Potassium: 4.2 mmol/L (ref 3.5–5.1)
SODIUM: 137 mmol/L (ref 135–145)

## 2018-03-10 LAB — CBC
HCT: 31.1 % — ABNORMAL LOW (ref 36.0–46.0)
Hemoglobin: 10 g/dL — ABNORMAL LOW (ref 12.0–15.0)
MCH: 29.2 pg (ref 26.0–34.0)
MCHC: 32.2 g/dL (ref 30.0–36.0)
MCV: 90.9 fL (ref 80.0–100.0)
Platelets: 364 10*3/uL (ref 150–400)
RBC: 3.42 MIL/uL — ABNORMAL LOW (ref 3.87–5.11)
RDW: 13.6 % (ref 11.5–15.5)
WBC: 7.1 10*3/uL (ref 4.0–10.5)
nRBC: 0 % (ref 0.0–0.2)

## 2018-03-10 LAB — ANA W/REFLEX IF POSITIVE: Anti Nuclear Antibody(ANA): NEGATIVE

## 2018-03-10 LAB — RHEUMATOID FACTOR: Rheumatoid fact SerPl-aCnc: 14.1 IU/mL — ABNORMAL HIGH (ref 0.0–13.9)

## 2018-03-10 MED ORDER — PREDNISONE 10 MG PO TABS: ORAL_TABLET | ORAL | 0 refills | Status: DC

## 2018-03-10 MED ORDER — PREDNISONE 20 MG PO TABS: ORAL_TABLET | ORAL | 0 refills | Status: DC

## 2018-03-10 NOTE — Discharge Instructions (Signed)
You have been placed on a tapering dose of prednisone steroid medication. Please take 3 of the 20mg  tablets (for a total of 60mg ) for 1 day, then go down to 2 of the 20mg  tablets (for a total of 40mg ) for 3 days, then go down to 20mg  daily for 3 days, then finish the 10mg  daily for 3 days and stop after that.

## 2018-03-10 NOTE — Progress Notes (Signed)
D/C instructions given to patient. Prescriptions reviewed. IV removed, clean and intact. Telemetry removed. All questions answered. Husband to escort home.  Clyde Canterbury, RN

## 2018-03-10 NOTE — Discharge Summary (Signed)
Discharge Summary    Patient ID: Stephanie Dudley MRN: [id number]; DOB: Oct 03, 1955  Admit date: 03/09/2018 Discharge date: 03/10/2018  Primary Care Provider: Glendale Chard, MD  Primary Cardiologist: Ena Dawley, MD  Primary Electrophysiologist:  None   Discharge Diagnoses    Active Problems:   Pericarditis   Allergies Allergies  Allergen Reactions  . Sulfa Antibiotics Swelling    Diagnostic Studies/Procedures    CTA of chest 03/09/2018 IMPRESSION: No evidence of pulmonary embolus.  Cardiomegaly, vascular congestion.  Small pericardial effusion.  Bibasilar atelectasis.  _____________   History of Present Illness     Stephanie Dudley is a 62 y.o. 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   Admitted 01/2017 for positional, pleuritic CP, worse with deep inspiration.She had negative troponin, mildly elevated CRP at 3 and sedimentation rate of 22. Her EKG haddiffuse ST elevations, she was started on empiric therapy for acute pericarditis and was started on ibuprofen and colchicine. Echo showed normal LVEF at 65-70%. There was no pericardial effusion.   Seen by Lyda Jester 03/01/18 for follow up.  She had a completed 2-week course of ibuprofen.  Symptoms improved but never resolved. Started on indomethacin 25 mg TID for the next 2 weeks, followed by a taper. Continued colchcine x 3 months.  Hospital Course     Consultants: N/A  Patient presented to the hospital on 03/09/2018 with persistent right-sided sharp chest pain that is worse with any position and also deep breath.  D-dimer was elevated, CT angiogram of the chest was negative for PE but does show vascular congestion and small pericardial effusion.  Point-of-care troponin was negative.  Patient was tachycardic with heart rate of 107 on arrival.  She was treated with Toradol  and prednisone.  ESR was elevated up to 70.  She had borderline rheumatoid factor.  Patient was seen in the morning of 03/10/2018, at which time she was stable from cardiology perspective.  She continued to have some mild chest discomfort, however chest pain worsens when she laughs or coughs.  It was recommended to discharge the patient home on steroid taper.  She has scheduled follow-up for next Monday.  We will also refer the patient to rheumatology if her ANA become positive or if her symptom does not improve. TSH was borderline low as well, this will need to be followed by PCP.   _____________  Discharge Vitals Blood pressure (!) 132/96, pulse (!) 107, temperature 97.6 F (36.4 C), temperature source Axillary, resp. rate (!) 22, height '5\' 5"'$  (1.651 m), weight 82.1 kg, SpO2 99 %.  Filed Weights   03/09/18 0830  Weight: 82.1 kg    Labs & Radiologic Studies    CBC Recent Labs    03/09/18 0833 03/10/18 0246  WBC 10.5 7.1  NEUTROABS 7.8*  --   HGB 10.2* 10.0*  HCT 32.6* 31.1*  MCV 92.4 90.9  PLT 341 830   Basic Metabolic Panel Recent Labs    03/09/18 0833 03/10/18 0246  NA 138 137  K 4.1 4.2  CL 103 102  CO2 23 23  GLUCOSE 110* 235*  BUN 6 10  CREATININE 0.88 0.82  CALCIUM 9.1 9.1   Liver Function Tests No results for input(s): AST, ALT, ALKPHOS, BILITOT, PROT, ALBUMIN in the last 72 hours. No results for input(s): LIPASE, AMYLASE in the last 72 hours. Cardiac Enzymes No results for input(s): CKTOTAL, CKMB, CKMBINDEX, TROPONINI in  the last 72 hours. BNP Invalid input(s): POCBNP D-Dimer Recent Labs    03/09/18 0833  DDIMER 1.90*   Hemoglobin A1C No results for input(s): HGBA1C in the last 72 hours. Fasting Lipid Panel No results for input(s): CHOL, HDL, LDLCALC, TRIG, CHOLHDL, LDLDIRECT in the last 72 hours. Thyroid Function Tests Recent Labs    03/09/18 1824  TSH 0.268*   _____________  Dg Chest 2 View  Result Date: 03/09/2018 CLINICAL DATA:  Onset of  right-sided chest pain yesterday with pleuritic component as well as positional component. There is palpable chest discomfort. Similar symptoms last month at which time the patient was diagnosed with pericarditis. EXAM: CHEST - 2 VIEW COMPARISON:  PA and lateral chest x-ray of February 01, 2018 and CT scan of the chest of the same date FINDINGS: The lungs are reasonably well inflated. Linear increased density as developed at both bases slightly greater on the left. There is no pleural effusion or pneumothorax. The cardiac silhouette is enlarged. The pulmonary vascularity is not engorged. There is no pleural effusion. The mediastinum is normal in width. The bony thorax exhibits no acute abnormality. IMPRESSION: Bibasilar atelectasis new since the previous study. No pleural effusion or alveolar infiltrate. Stable enlargement of the cardiac silhouette which could reflect an underlying pericardial effusion. No overt pulmonary edema. Electronically Signed   By: David  Martinique M.D.   On: 03/09/2018 09:29   Dg Lumbar Spine Complete  Result Date: 02/26/2018 CLINICAL DATA:  Injured back yesterday. Right-sided back pain and right leg pain. EXAM: LUMBAR SPINE - COMPLETE 4+ VIEW COMPARISON:  None. FINDINGS: Normal alignment of the lumbar vertebral bodies. Disc spaces and vertebral bodies are maintained. The facets are normally aligned. No pars defects. The visualized bony pelvis is intact. Aortic calcifications and probable calcified fibroid in the right pelvis. IMPRESSION: Normal alignment and no acute bony findings or significant degenerative changes. Electronically Signed   By: Marijo Sanes M.D.   On: 02/26/2018 12:02   Ct Angio Chest Pe W And/or Wo Contrast  Result Date: 03/09/2018 CLINICAL DATA:  Left chest pain EXAM: CT ANGIOGRAPHY CHEST WITH CONTRAST TECHNIQUE: Multidetector CT imaging of the chest was performed using the standard protocol during bolus administration of intravenous contrast. Multiplanar CT image  reconstructions and MIPs were obtained to evaluate the vascular anatomy. CONTRAST:  82m ISOVUE-370 IOPAMIDOL (ISOVUE-370) INJECTION 76% COMPARISON:  02/01/2018 FINDINGS: Cardiovascular: No filling defects in the pulmonary arteries to suggest pulmonary emboli. Mild cardiomegaly. Aorta is normal caliber. Small pericardial effusion. Mediastinum/Nodes: No mediastinal, hilar, or axillary adenopathy. Lungs/Pleura: Linear bibasilar atelectasis. Mild vascular congestion. No effusions. Upper Abdomen: Imaging into the upper abdomen shows no acute findings. Musculoskeletal: Chest wall soft tissues are unremarkable. No acute bony abnormality. Review of the MIP images confirms the above findings. IMPRESSION: No evidence of pulmonary embolus. Cardiomegaly, vascular congestion. Small pericardial effusion. Bibasilar atelectasis. Electronically Signed   By: KRolm BaptiseM.D.   On: 03/09/2018 09:54   Dg Foot Complete Left  Result Date: 02/15/2018 Please see detailed radiograph report in office note.  Dg Foot Complete Right  Result Date: 02/15/2018 Please see detailed radiograph report in office note.  Disposition   Pt is being discharged home today in good condition.  Follow-up Plans & Appointments    Follow-up Information    DCharlie Pitter PA-C Follow up on 03/14/2018.   Specialties:  Cardiology, Radiology Why:  11:30AM. Cardiology visit.  Contact information: [phone]  Glendale Chard, MD. Schedule an appointment as soon as possible for a visit.   Specialty:  Internal Medicine Contact information: [address] [place] [address] [phone]          Discharge Instructions    Diet - low sodium heart healthy   Complete by:  As directed    Increase activity slowly   Complete by:  As directed       Discharge Medications   Allergies as of 03/10/2018      Reactions   Sulfa Antibiotics Swelling        Medication List    TAKE these medications   colchicine 0.6 MG tablet Take 1 tablet (0.6 mg total) by mouth 2 (two) times daily.   diclofenac sodium 1 % Gel Commonly known as:  VOLTAREN Apply 2 g topically 4 (four) times daily.   furosemide 40 MG tablet Commonly known as:  LASIX Take 40 mg by mouth daily.   methocarbamol 500 MG tablet Commonly known as:  ROBAXIN Take 1 tablet (500 mg total) by mouth 2 (two) times daily.   pantoprazole 40 MG tablet Commonly known as:  PROTONIX Take 1 tablet (40 mg total) by mouth daily before breakfast.   predniSONE 20 MG tablet Commonly known as:  DELTASONE Take 3 tablets ('60mg'$ ) for 1 day, then 2 tablets ('40mg'$ )for 3 days, then 1 tablet ('20mg'$ )for 3 days, then start on the '10mg'$  regimen for 3 days   predniSONE 10 MG tablet Commonly known as:  DELTASONE Start 1 tablet daily for 3 days after completing the first steroid prescription   rosuvastatin 20 MG tablet Commonly known as:  CRESTOR Take 20 mg by mouth daily.   spironolactone 25 MG tablet Commonly known as:  ALDACTONE TAKE ONE TABLET BY MOUTH DAILY   SYNTHROID 88 MCG tablet Generic drug:  levothyroxine Take 1 tablet (88 mcg total) by mouth daily.   valsartan 160 MG tablet Commonly known as:  DIOVAN TAKE ONE TABLET BY MOUTH DAILY        Acute coronary syndrome (MI, NSTEMI, STEMI, etc) this admission?: No.    Outstanding Labs/Studies   Followup on ANA lab. See PCP for borderline low TSH  Duration of Discharge Encounter   Greater than 30 minutes including physician time.  Hilbert Corrigan, PA 03/10/2018, 10:39 AM

## 2018-03-10 NOTE — Progress Notes (Signed)
Progress Note  Patient Name: Stephanie Dudley Date of Encounter: 03/10/2018  Primary Cardiologist: Meda Coffee  Subjective   Felt better within about an hour of toradol and prednisone. Still has pleuritic pain when she laughs or coughs. ESR up to 70. Borderline RA factor, likely not significant. ANA pending  Inpatient Medications    Scheduled Meds: . colchicine  0.6 mg Oral BID  . furosemide  40 mg Oral Daily  . irbesartan  150 mg Oral Daily  . levothyroxine  88 mcg Oral Daily  . pantoprazole  40 mg Oral QAC breakfast  . [START ON 03/18/2018] predniSONE  10 mg Oral Q breakfast  . [START ON 03/15/2018] predniSONE  20 mg Oral Q breakfast  . [START ON 03/12/2018] predniSONE  40 mg Oral Q breakfast  . predniSONE  60 mg Oral Q breakfast  . rosuvastatin  20 mg Oral Daily  . spironolactone  25 mg Oral Daily   Continuous Infusions:  PRN Meds: acetaminophen, nitroGLYCERIN, ondansetron (ZOFRAN) IV, traMADol   Vital Signs    Vitals:   03/09/18 1549 03/09/18 1716 03/09/18 1942 03/10/18 0500  BP: (!) 143/89 (!) 142/92 140/81 (!) 132/96  Pulse: (!) 106 96 (!) 107   Resp: 20  (!) 25 (!) 22  Temp:  98.8 F (37.1 C) 98.7 F (37.1 C) 97.6 F (36.4 C)  TempSrc:  Oral Oral Axillary  SpO2: 95% 97% 97% 99%  Weight:      Height:        Intake/Output Summary (Last 24 hours) at 03/10/2018 0936 Last data filed at 03/10/2018 0500 Gross per 24 hour  Intake 500 ml  Output -  Net 500 ml   Filed Weights   03/09/18 0830  Weight: 82.1 kg    Telemetry    NSR/mild sinus tachycardia - Personally Reviewed  ECG    No new tracing - Personally Reviewed  Physical Exam  Comfortable GEN: No acute distress.   Neck: No JVD Cardiac: RRR, no murmurs, rubs, or gallops.  Respiratory: Clear to auscultation bilaterally. GI: Soft, nontender, non-distended  MS: No edema; No deformity. Neuro:  Nonfocal  Psych: Normal affect   Labs    Chemistry Recent Labs  Lab 03/09/18 0833  03/10/18 0246  NA 138 137  K 4.1 4.2  CL 103 102  CO2 23 23  GLUCOSE 110* 235*  BUN 6 10  CREATININE 0.88 0.82  CALCIUM 9.1 9.1  GFRNONAA >60 >60  GFRAA >60 >60  ANIONGAP 12 12     Hematology Recent Labs  Lab 03/09/18 0833 03/10/18 0246  WBC 10.5 7.1  RBC 3.53* 3.42*  HGB 10.2* 10.0*  HCT 32.6* 31.1*  MCV 92.4 90.9  MCH 28.9 29.2  MCHC 31.3 32.2  RDW 13.8 13.6  PLT 341 364    Cardiac EnzymesNo results for input(s): TROPONINI in the last 168 hours.  Recent Labs  Lab 03/09/18 0840  TROPIPOC 0.00     BNPNo results for input(s): BNP, PROBNP in the last 168 hours.   DDimer  Recent Labs  Lab 03/09/18 318 127 2948  DDIMER 1.90*     Radiology    Dg Chest 2 View  Result Date: 03/09/2018 CLINICAL DATA:  Onset of right-sided chest pain yesterday with pleuritic component as well as positional component. There is palpable chest discomfort. Similar symptoms last month at which time the patient was diagnosed with pericarditis. EXAM: CHEST - 2 VIEW COMPARISON:  PA and lateral chest x-ray of February 01, 2018 and CT scan of  the chest of the same date FINDINGS: The lungs are reasonably well inflated. Linear increased density as developed at both bases slightly greater on the left. There is no pleural effusion or pneumothorax. The cardiac silhouette is enlarged. The pulmonary vascularity is not engorged. There is no pleural effusion. The mediastinum is normal in width. The bony thorax exhibits no acute abnormality. IMPRESSION: Bibasilar atelectasis new since the previous study. No pleural effusion or alveolar infiltrate. Stable enlargement of the cardiac silhouette which could reflect an underlying pericardial effusion. No overt pulmonary edema. Electronically Signed   By: David  Martinique M.D.   On: 03/09/2018 09:29   Ct Angio Chest Pe W And/or Wo Contrast  Result Date: 03/09/2018 CLINICAL DATA:  Left chest pain EXAM: CT ANGIOGRAPHY CHEST WITH CONTRAST TECHNIQUE: Multidetector CT imaging  of the chest was performed using the standard protocol during bolus administration of intravenous contrast. Multiplanar CT image reconstructions and MIPs were obtained to evaluate the vascular anatomy. CONTRAST:  89m ISOVUE-370 IOPAMIDOL (ISOVUE-370) INJECTION 76% COMPARISON:  02/01/2018 FINDINGS: Cardiovascular: No filling defects in the pulmonary arteries to suggest pulmonary emboli. Mild cardiomegaly. Aorta is normal caliber. Small pericardial effusion. Mediastinum/Nodes: No mediastinal, hilar, or axillary adenopathy. Lungs/Pleura: Linear bibasilar atelectasis. Mild vascular congestion. No effusions. Upper Abdomen: Imaging into the upper abdomen shows no acute findings. Musculoskeletal: Chest wall soft tissues are unremarkable. No acute bony abnormality. Review of the MIP images confirms the above findings. IMPRESSION: No evidence of pulmonary embolus. Cardiomegaly, vascular congestion. Small pericardial effusion. Bibasilar atelectasis. Electronically Signed   By: KRolm BaptiseM.D.   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  Assessment & Plan    DC home on steroid taper. Keep follow up appt on Monday. Refer to Rheumatology if ANA positive or if she does not improve.  For questions or updates, please contact [place]ole[place]Please consult [email or web address] for contact info under        Signed, MSanda Klein MD  03/10/2018, 9:36 AM

## 2018-03-12 ENCOUNTER — Encounter: Payer: Self-pay | Admitting: Physician Assistant

## 2018-03-12 NOTE — Progress Notes (Signed)
Cardiology Office Note    Date:  03/14/2018  ID:  Stephanie Dudley, DOB [birth year], MRN [id number] PCP:  Glendale Chard, MD  Cardiologist:  Ena Dawley, MD   Chief Complaint: f/u pericarditis  History of Present Illness:  Stephanie Dudley is a 62 y.o. female with history of HTN, HLD (followed by PCP), MVP, hypothyroidism,breast CAs/p ofright lumpectomy with seed, recent pericarditis with pericardial effusion who presents to f/u pericarditis.  She was recently admitted 01/2018 for idiopathic pericarditis presents for chest pain, elevated CRP/ESR, and diffuse ST elevation on EKG felt consistent with pericarditis. 2D echo showed EF 65-70%, otherwise normal without pericardial effusion. She saw Lyda Jester 03/01/18 for follow up.She had completed 2-week course of ibuprofen. Symptoms improved but never resolved so she was started onindomethacin 25 mg TID for the next 2 weeks, followed by a taper, with recomendation to otherwise continue colchcine x 3 months. She presented back to the hospital 03/09/18 with right sided chest pain worse with any position, coughing, yawning and deep breathing. D-dimer was elevated, CT angiogram of the chest was negative for PE but did show vascular congestion and small pericardial effusion. She was mildly tachycardic. Sed rate was further elevated and she had borderline rheumatoid factor. TSH was borderline low as well and she was advised to f/u PCP for this. She was treated with toradol and prednisone. She was sent home with steroid taper as advised below. Last labs otherwise showed K 4.2, Cr 0.82, Hgb 10.0, TSH 0.268, ANA negative, RF 14.1, LFTs 01/2018 OK.  She returns for follow-up feeling much better on prednisone without recurrent CP/back pain. Feels a little jittery, but otherwise fine. BP mildly elevated today which she attributes to [employer] holiday traffic. She follows this at home and it has been normal. No back pain, bleeding, vomiting,  edema, hypotension or tachycardia.   Past Medical History:  Diagnosis Date  . Arthritis   . Breast mass, right 08/2017   Benign tissue  . Complication of anesthesia    hard to wake up with thyroid surgery  . Family history of breast cancer   . Family history of thyroid cancer   . Family history of uterine cancer   . Hyperlipidemia   . Hypertension   . Hypothyroidism   . Mitral valve prolapse    a. not seen on echo 01/2018.  Marland Kitchen Pericardial effusion   . Pericarditis   . Peripheral edema    ankles, feet    Past Surgical History:  Procedure Laterality Date  . APPENDECTOMY  1994  . BREAST BIOPSY Left 1999  . BREAST LUMPECTOMY WITH RADIOACTIVE SEED LOCALIZATION Right 09/28/2017   Procedure: RIGHT BREAST LUMPECTOMY WITH RADIOACTIVE SEED LOCALIZATION;  Surgeon: Erroll Luna, MD;  Location: [facility];  Service: General;  Laterality: Right;  . BREAST SURGERY    . KNEE ARTHROSCOPY Left   . MIDDLE EAR SURGERY Left 2011  . THYROIDECTOMY  1974   goiter--total thyroidectomy  . TONSILLECTOMY  1972  . WRIST FRACTURE SURGERY      Current Medications: Current Meds  Medication Sig   colchicine Take 1 tablet (0.'6mg'$ ) by mouth twice daily  . furosemide (LASIX) 40 MG tablet Take 40 mg by mouth daily.   . pantoprazole (PROTONIX) 40 MG tablet Take 1 tablet (40 mg total) by mouth daily before breakfast.  . predniSONE (DELTASONE) 10 MG tablet Start 1 tablet daily for 3 days after completing the first steroid prescription  . predniSONE (DELTASONE) 20 MG tablet Take  3 tablets ('60mg'$ ) for 1 day, then 2 tablets ('40mg'$ )for 3 days, then 1 tablet ('20mg'$ )for 3 days, then start on the '10mg'$  regimen for 3 days  . rosuvastatin (CRESTOR) 20 MG tablet Take 20 mg by mouth daily.  Marland Kitchen spironolactone (ALDACTONE) 25 MG tablet TAKE ONE TABLET BY MOUTH DAILY (Patient taking differently: Take 25 mg by mouth daily. )  . SYNTHROID 88 MCG tablet Take 1 tablet (88 mcg total) by mouth daily.  . valsartan  (DIOVAN) 160 MG tablet TAKE ONE TABLET BY MOUTH DAILY (Patient taking differently: Take 160 mg by mouth daily. )  . [DISCONTINUED] indomethacin (INDOCIN) 25 MG capsule Take 25 mg by mouth 3 (three) times daily with meals.   Allergies:   Sulfa antibiotics   Social History   Socioeconomic History  . Marital status: Married    Spouse name: Not on file  . Number of children: Not on file  . Years of education: Not on file  . Highest education level: Not on file  Occupational History  . Not on file  Social Needs  . Financial resource strain: Not on file  . Food insecurity:    Worry: Not on file    Inability: Not on file  . Transportation needs:    Medical: Not on file    Non-medical: Not on file  Tobacco Use  . Smoking status: Former Research scientist (life sciences)  . Smokeless tobacco: Never Used  Substance and Sexual Activity  . Alcohol use: Yes    Comment: wine 2x/wk  . Drug use: No  . Sexual activity: Yes    Birth control/protection: Post-menopausal  Lifestyle  . Physical activity:    Days per week: Not on file    Minutes per session: Not on file  . Stress: Not on file  Relationships  . Social connections:    Talks on phone: Not on file    Gets together: Not on file    Attends religious service: Not on file    Active member of club or organization: Not on file    Attends meetings of clubs or organizations: Not on file    Relationship status: Not on file  Other Topics Concern  . Not on file  Social History Narrative  . Not on file     Family History:  The patient's family history includes Breast cancer (age of onset: 15) in her sister; Thyroid cancer in her mother; Uterine cancer (age of onset: 10) in her paternal grandmother.  ROS:   Please see the history of present illness.  All other systems are reviewed and otherwise negative.    PHYSICAL EXAM:   VS:  BP 140/78   Pulse 83   Ht '5\' 6"'$  (1.676 m)   Wt 191 lb 12.8 oz (87 kg)   SpO2 97%   BMI 30.96 kg/m   BMI: Body mass index is  30.96 kg/m. GEN: Well nourished, well developed AAF in no acute distress HEENT: normocephalic, atraumatic Neck: no JVD, carotid bruits, or masses Cardiac: RRR; no murmurs, rubs, or gallops, no edema  Respiratory:  clear to auscultation bilaterally, normal work of breathing GI: soft, nontender, nondistended, + BS MS: no deformity or atrophy Skin: warm and dry, no rash Neuro:  Alert and Oriented x 3, Strength and sensation are intact, follows commands Psych: euthymic mood, full affect  Wt Readings from Last 3 Encounters:  03/14/18 191 lb 12.8 oz (87 kg)  03/09/18 181 lb (82.1 kg)  03/01/18 184 lb 12.8 oz (83.8 kg)  Studies/Labs Reviewed:   EKG:   EKG was not ordered today.  Recent Labs: 02/01/2018: ALT 16 02/02/2018: B Natriuretic Peptide 18.8 03/09/2018: TSH 0.268 03/10/2018: BUN 10; Creatinine, Ser 0.82; Hemoglobin 10.0; Platelets 364; Potassium 4.2; Sodium 137   Lipid Panel    Component Value Date/Time   CHOL 206 (H) 01/28/2018 1600   TRIG 346 (H) 01/28/2018 1600   HDL 59 01/28/2018 1600   CHOLHDL 3.5 01/28/2018 1600   LDLCALC 78 01/28/2018 1600    Additional studies/ records that were reviewed today include: Summarized above   ASSESSMENT & PLAN:   1. Pericarditis - improving. Continue steroid taper. She also resumed indomethacin because she did not receive instructions to stop. For some reason this did not make it to her home med list during recent admission therefore was not on STOP list. I have given her the OK to stop this since she is on the prednisone. Anticipate continuing colchicine for 3 months (started in 01/2018). She had an rx sent in 02/02/18 for #60 with 1 refill so may have run out before repeat episode occurred. She's not sure. However, she said she was sure she is taking it now. I reviewed interaction with statin with pharmacy. While she is on colchicine will have her cut her Crestor tablet in half daily. When she discontinues colchicine anticipate  she can resume prior home dose. Will also trend CRP, ESR today. Refer to rheumatology given recurrent pericarditis without obvious viral or med trigger. 2. Pericardial effusion - follow clinically for now. 3. Abnormal TSH - since we are getting labs today anyway, will obtain free T4. Advised f/u with PCP. 4. HTN - BP mildly elevated today in setting of holiday traffic. She reports normal values at home. The patient was instructed to monitor their blood pressure at home and to call if tending to run higher than 961 systolic. 5. Anemia, unspecified - recheck CBC today to ensure stable. Discontinue indomethacin. Advised she f/u PCP to review further trends.  Disposition: F/u with myself in 6 weeks.  Medication Adjustments/Labs and Tests Ordered: Current medicines are reviewed at length with the patient today.  Concerns regarding medicines are outlined above. Medication changes, Labs and Tests ordered today are summarized above and listed in the Patient Instructions accessible in Encounters.   Signed, Charlie Pitter, PA-C  03/14/2018 11:52 AM    [facility], [place], [place]  [address] Phone: [phone]; Fax: [phone]

## 2018-03-14 ENCOUNTER — Ambulatory Visit: Payer: Federal, State, Local not specified - PPO | Admitting: Physician Assistant

## 2018-03-14 ENCOUNTER — Encounter: Payer: Self-pay | Admitting: Physician Assistant

## 2018-03-14 VITALS — BP 140/78 | HR 83 | Ht 66.0 in | Wt 191.8 lb

## 2018-03-14 DIAGNOSIS — I3139 Other pericardial effusion (noninflammatory): Secondary | ICD-10-CM

## 2018-03-14 DIAGNOSIS — I10 Essential (primary) hypertension: Secondary | ICD-10-CM | POA: Diagnosis not present

## 2018-03-14 DIAGNOSIS — R7989 Other specified abnormal findings of blood chemistry: Secondary | ICD-10-CM | POA: Diagnosis not present

## 2018-03-14 DIAGNOSIS — D649 Anemia, unspecified: Secondary | ICD-10-CM | POA: Diagnosis not present

## 2018-03-14 DIAGNOSIS — I309 Acute pericarditis, unspecified: Secondary | ICD-10-CM | POA: Diagnosis not present

## 2018-03-14 DIAGNOSIS — I313 Pericardial effusion (noninflammatory): Secondary | ICD-10-CM

## 2018-03-14 MED ORDER — ROSUVASTATIN CALCIUM 10 MG PO TABS: 10.0000 mg | ORAL_TABLET | Freq: Every day | ORAL | 3 refills | Status: DC

## 2018-03-14 MED ORDER — COLCHICINE 0.6 MG PO TABS: 0.6000 mg | ORAL_TABLET | Freq: Two times a day (BID) | ORAL | 2 refills | Status: DC

## 2018-03-14 NOTE — Patient Instructions (Addendum)
Medication Instructions:  Your physician has recommended you make the following change in your medication:  1.  STOP the Indomethacin 2.  DECREASE the Crestor to 1/2 tablet (while you're on the Colchicine)  If you need a refill on your cardiac medications before your next appointment, please call your pharmacy.   Lab work: TODAY:  CBC, FREE T4, ESR, & CRP  If you have labs (blood work) drawn today and your tests are completely normal, you will receive your results only by: Marland Kitchen MyChart Message (if you have MyChart) OR . A paper copy in the mail If you have any lab test that is abnormal or we need to change your treatment, we will call you to review the results.  Testing/Procedures: None ordered   You have been referred to RHEUMATOLOGIST.  THEY SHOULD CALL YOU FOR AN APPOINTMENT.   Follow-Up: Your physician recommends that you schedule a follow-up appointment in: 05/03/18 ARRIVE AT 9:45 TO SEE DAYNA DUNN, PA-C  Any Other Special Instructions Will Be Listed Below (If Applicable).

## 2018-03-15 LAB — CBC
Hematocrit: 33.8 % — ABNORMAL LOW (ref 34.0–46.6)
Hemoglobin: 11 g/dL — ABNORMAL LOW (ref 11.1–15.9)
MCH: 28.6 pg (ref 26.6–33.0)
MCHC: 32.5 g/dL (ref 31.5–35.7)
MCV: 88 fL (ref 79–97)
Platelets: 481 10*3/uL — ABNORMAL HIGH (ref 150–450)
RBC: 3.84 x10E6/uL (ref 3.77–5.28)
RDW: 14.1 % (ref 12.3–15.4)
WBC: 9.5 10*3/uL (ref 3.4–10.8)

## 2018-03-15 LAB — C-REACTIVE PROTEIN: CRP: 10 mg/L (ref 0–10)

## 2018-03-15 LAB — SEDIMENTATION RATE: Sed Rate: 66 mm/hr — ABNORMAL HIGH (ref 0–40)

## 2018-03-15 LAB — T4, FREE: Free T4: 1.54 ng/dL (ref 0.82–1.77)

## 2018-03-28 ENCOUNTER — Emergency Department (HOSPITAL_COMMUNITY): Payer: Federal, State, Local not specified - PPO

## 2018-03-28 ENCOUNTER — Encounter (HOSPITAL_COMMUNITY): Payer: Self-pay | Admitting: *Deleted

## 2018-03-28 ENCOUNTER — Other Ambulatory Visit: Payer: Self-pay

## 2018-03-28 ENCOUNTER — Observation Stay (HOSPITAL_COMMUNITY)
Admission: EM | Admit: 2018-03-28 | Discharge: 2018-03-29 | Disposition: A | Discharge status: Home / Self Care | Payer: Federal, State, Local not specified - PPO | Attending: Internal Medicine | Admitting: Internal Medicine

## 2018-03-28 ENCOUNTER — Telehealth: Payer: Self-pay | Admitting: Physician Assistant

## 2018-03-28 DIAGNOSIS — I313 Pericardial effusion (noninflammatory): Secondary | ICD-10-CM | POA: Insufficient documentation

## 2018-03-28 DIAGNOSIS — Z791 Long term (current) use of non-steroidal anti-inflammatories (NSAID): Secondary | ICD-10-CM | POA: Insufficient documentation

## 2018-03-28 DIAGNOSIS — R05 Cough: Secondary | ICD-10-CM | POA: Diagnosis not present

## 2018-03-28 DIAGNOSIS — E782 Mixed hyperlipidemia: Secondary | ICD-10-CM

## 2018-03-28 DIAGNOSIS — R072 Precordial pain: Secondary | ICD-10-CM

## 2018-03-28 DIAGNOSIS — Z79899 Other long term (current) drug therapy: Secondary | ICD-10-CM | POA: Insufficient documentation

## 2018-03-28 DIAGNOSIS — Z79890 Hormone replacement therapy (postmenopausal): Secondary | ICD-10-CM | POA: Diagnosis not present

## 2018-03-28 DIAGNOSIS — E039 Hypothyroidism, unspecified: Secondary | ICD-10-CM | POA: Diagnosis not present

## 2018-03-28 DIAGNOSIS — I300 Acute nonspecific idiopathic pericarditis: Secondary | ICD-10-CM | POA: Diagnosis not present

## 2018-03-28 DIAGNOSIS — Z87891 Personal history of nicotine dependence: Secondary | ICD-10-CM | POA: Insufficient documentation

## 2018-03-28 DIAGNOSIS — R0602 Shortness of breath: Secondary | ICD-10-CM | POA: Diagnosis not present

## 2018-03-28 DIAGNOSIS — I10 Essential (primary) hypertension: Secondary | ICD-10-CM | POA: Diagnosis present

## 2018-03-28 DIAGNOSIS — R079 Chest pain, unspecified: Secondary | ICD-10-CM | POA: Diagnosis not present

## 2018-03-28 DIAGNOSIS — E785 Hyperlipidemia, unspecified: Secondary | ICD-10-CM | POA: Diagnosis not present

## 2018-03-28 DIAGNOSIS — R000 Tachycardia, unspecified: Secondary | ICD-10-CM | POA: Diagnosis not present

## 2018-03-28 DIAGNOSIS — I319 Disease of pericardium, unspecified: Secondary | ICD-10-CM | POA: Diagnosis present

## 2018-03-28 DIAGNOSIS — J9811 Atelectasis: Secondary | ICD-10-CM | POA: Diagnosis not present

## 2018-03-28 LAB — SEDIMENTATION RATE: SED RATE: 30 mm/h — AB (ref 0–22)

## 2018-03-28 LAB — BASIC METABOLIC PANEL
ANION GAP: 12 (ref 5–15)
BUN: 8 mg/dL (ref 8–23)
CO2: 23 mmol/L (ref 22–32)
Calcium: 9 mg/dL (ref 8.9–10.3)
Chloride: 101 mmol/L (ref 98–111)
Creatinine, Ser: 0.88 mg/dL (ref 0.44–1.00)
GFR calc Af Amer: >60 mL/min (ref >60)
GFR calc non Af Amer: >60 mL/min (ref >60)
Glucose, Bld: 194 mg/dL — ABNORMAL HIGH (ref 70–99)
Potassium: 3.7 mmol/L (ref 3.5–5.1)
Sodium: 136 mmol/L (ref 135–145)

## 2018-03-28 LAB — C-REACTIVE PROTEIN: CRP: 3.7 mg/dL — ABNORMAL HIGH (ref <1.0)

## 2018-03-28 LAB — CBC
HCT: 35.6 % — ABNORMAL LOW (ref 36.0–46.0)
Hemoglobin: 11 g/dL — ABNORMAL LOW (ref 12.0–15.0)
MCH: 29 pg (ref 26.0–34.0)
MCHC: 30.9 g/dL (ref 30.0–36.0)
MCV: 93.9 fL (ref 80.0–100.0)
Platelets: 250 10*3/uL (ref 150–400)
RBC: 3.79 MIL/uL — ABNORMAL LOW (ref 3.87–5.11)
RDW: 15.7 % — AB (ref 11.5–15.5)
WBC: 8.6 10*3/uL (ref 4.0–10.5)
nRBC: 0 % (ref 0.0–0.2)

## 2018-03-28 LAB — I-STAT TROPONIN, ED: Troponin i, poc: 0 ng/mL (ref 0.00–0.08)

## 2018-03-28 LAB — TSH: TSH: 0.745 u[IU]/mL (ref 0.350–4.500)

## 2018-03-28 MED ORDER — PREDNISONE 20 MG PO TABS
40.0000 mg | ORAL_TABLET | Freq: Every day | ORAL | Status: DC
  Administered 2018-03-28 – 2018-03-29 (×2): 40 mg via ORAL
  Filled 2018-03-28 (×2): qty 2

## 2018-03-28 MED ORDER — SODIUM CHLORIDE 0.9% FLUSH
3.0000 mL | Freq: Two times a day (BID) | INTRAVENOUS | Status: DC
  Administered 2018-03-28 – 2018-03-29 (×2): 3 mL via INTRAVENOUS

## 2018-03-28 MED ORDER — ONDANSETRON HCL 4 MG/2ML IJ SOLN: 4.0000 mg | Freq: Four times a day (QID) | INTRAMUSCULAR | Status: DC | PRN

## 2018-03-28 MED ORDER — ROSUVASTATIN CALCIUM 5 MG PO TABS
5.0000 mg | ORAL_TABLET | Freq: Every day | ORAL | Status: DC
  Administered 2018-03-29: 5 mg via ORAL
  Filled 2018-03-28: qty 1

## 2018-03-28 MED ORDER — LEVOTHYROXINE SODIUM 88 MCG PO TABS
88.0000 ug | ORAL_TABLET | Freq: Every day | ORAL | Status: DC
  Administered 2018-03-29: 88 ug via ORAL
  Filled 2018-03-28 (×2): qty 1

## 2018-03-28 MED ORDER — IRBESARTAN 150 MG PO TABS
150.0000 mg | ORAL_TABLET | Freq: Every day | ORAL | Status: DC
  Administered 2018-03-29: 150 mg via ORAL
  Filled 2018-03-28: qty 1

## 2018-03-28 MED ORDER — SPIRONOLACTONE 25 MG PO TABS
25.0000 mg | ORAL_TABLET | Freq: Every day | ORAL | Status: DC
  Administered 2018-03-29: 25 mg via ORAL
  Filled 2018-03-28: qty 1

## 2018-03-28 MED ORDER — ACETAMINOPHEN 325 MG PO TABS
650.0000 mg | ORAL_TABLET | ORAL | Status: DC | PRN
  Administered 2018-03-28 – 2018-03-29 (×2): 650 mg via ORAL
  Filled 2018-03-28 (×2): qty 2

## 2018-03-28 MED ORDER — MORPHINE SULFATE (PF) 4 MG/ML IV SOLN
4.0000 mg | Freq: Once | INTRAVENOUS | Status: AC
  Administered 2018-03-28: 4 mg via INTRAVENOUS
  Filled 2018-03-28: qty 1

## 2018-03-28 MED ORDER — SODIUM CHLORIDE 0.9 % IV SOLN: 250.0000 mL | INTRAVENOUS | Status: DC | PRN

## 2018-03-28 MED ORDER — PANTOPRAZOLE SODIUM 40 MG PO TBEC
40.0000 mg | DELAYED_RELEASE_TABLET | Freq: Every day | ORAL | Status: DC
  Administered 2018-03-29: 40 mg via ORAL
  Filled 2018-03-28: qty 1

## 2018-03-28 MED ORDER — KETOROLAC TROMETHAMINE 15 MG/ML IJ SOLN
15.0000 mg | Freq: Once | INTRAMUSCULAR | Status: AC
  Administered 2018-03-28: 15 mg via INTRAVENOUS
  Filled 2018-03-28: qty 1

## 2018-03-28 MED ORDER — FUROSEMIDE 40 MG PO TABS
40.0000 mg | ORAL_TABLET | Freq: Every day | ORAL | Status: DC
  Administered 2018-03-29: 40 mg via ORAL
  Filled 2018-03-28: qty 2

## 2018-03-28 MED ORDER — COLCHICINE 0.6 MG PO TABS
0.6000 mg | ORAL_TABLET | Freq: Two times a day (BID) | ORAL | Status: DC
  Administered 2018-03-28 – 2018-03-29 (×2): 0.6 mg via ORAL
  Filled 2018-03-28 (×2): qty 1

## 2018-03-28 MED ORDER — SODIUM CHLORIDE 0.9% FLUSH: 3.0000 mL | INTRAVENOUS | Status: DC | PRN

## 2018-03-28 NOTE — ED Provider Notes (Signed)
[facility] EMERGENCY DEPARTMENT Provider Note   CSN: [id number] Arrival date & time: 03/28/18  1128     History   Chief Complaint Chief Complaint  Patient presents with  . Chest Pain    HPI Stephanie Dudley is a 63 y.o. female.  HPI Patient is a 63 year old female with a history of recurrent pericarditis who presents at the request of her cardiology team to the emergency department for worsening anterior chest discomfort since last night.  Worse to lie flat.  Feels better when she sits forward.  Feels like her prior pericarditis.  She underwent prior echocardiogram that demonstrated no pericardial effusion.  She has been referred to rheumatology but has not had a follow-up appointment yet.  She tried ibuprofen over the past 24 hours with some improvement until the medication wears off at which point her pain returns.  Her pain is severe in severity at this time.  No unilateral leg swelling.  No history of DVT or pulmonary embolism.  No history of coronary artery disease.  She has a history of hypertension, hyperlipidemia, mitral valve prolapse, breast cancer status post right lumpectomy.  CRP and sed rate were elevated during her last hospitalization.  During her last hospitalization she underwent CT angiogram of her chest which demonstrated no evidence of pulmonary embolism   Past Medical History:  Diagnosis Date  . Arthritis   . Breast mass, right 08/2017   Benign tissue  . Complication of anesthesia    hard to wake up with thyroid surgery  . Family history of breast cancer   . Family history of thyroid cancer   . Family history of uterine cancer   . Hyperlipidemia   . Hypertension   . Hypothyroidism   . Mitral valve prolapse    a. not seen on echo 01/2018.  Marland Kitchen Pericardial effusion   . Pericarditis   . Peripheral edema    ankles, feet    Patient Active Problem List   Diagnosis Date Noted  . Breast cancer (Organ) 02/02/2018  . Chest pain 02/02/2018    . Pericarditis 02/02/2018  . Hyperlipidemia   . Hypertension   . Hypothyroidism   . Ankle swelling   . Genetic testing 11/08/2017  . Family history of breast cancer   . Family history of thyroid cancer   . Family history of uterine cancer     Past Surgical History:  Procedure Laterality Date  . APPENDECTOMY  1994  . BREAST BIOPSY Left 1999  . BREAST LUMPECTOMY WITH RADIOACTIVE SEED LOCALIZATION Right 09/28/2017   Procedure: RIGHT BREAST LUMPECTOMY WITH RADIOACTIVE SEED LOCALIZATION;  Surgeon: Erroll Luna, MD;  Location: [facility];  Service: General;  Laterality: Right;  . BREAST SURGERY    . KNEE ARTHROSCOPY Left   . MIDDLE EAR SURGERY Left 2011  . THYROIDECTOMY  1974   goiter--total thyroidectomy  . TONSILLECTOMY  1972  . WRIST FRACTURE SURGERY       OB History   No obstetric history on file.      Home Medications    Prior to Admission medications   Medication Sig Start Date End Date Taking? Authorizing Provider  colchicine 0.6 MG tablet Take 1 tablet (0.6 mg total) by mouth 2 (two) times daily. 03/14/18 04/13/18 Yes Dunn, Dayna N, PA-C  furosemide (LASIX) 40 MG tablet Take 40 mg by mouth daily.  09/25/17  Yes [provider]  ibuprofen (ADVIL,MOTRIN) 200 MG tablet Take 400 mg by mouth every 6 (six)  hours as needed for moderate pain.   Yes [provider]  pantoprazole (PROTONIX) 40 MG tablet Take 1 tablet (40 mg total) by mouth daily before breakfast. 03/01/18 03/31/18 Yes Rosita Fire, Brittainy M, PA-C  rosuvastatin (CRESTOR) 10 MG tablet Take 1 tablet (10 mg total) by mouth daily. Patient taking differently: Take 5 mg by mouth daily.  03/14/18 06/12/18 Yes Dunn, Dayna N, PA-C  spironolactone (ALDACTONE) 25 MG tablet TAKE ONE TABLET BY MOUTH DAILY Patient taking differently: Take 25 mg by mouth daily.  01/07/18  Yes Glendale Chard, MD  SYNTHROID 88 MCG tablet Take 1 tablet (88 mcg total) by mouth daily. 02/16/18  Yes Glendale Chard, MD   valsartan (DIOVAN) 160 MG tablet TAKE ONE TABLET BY MOUTH DAILY Patient taking differently: Take 160 mg by mouth daily.  01/07/18  Yes Glendale Chard, MD    Family History Family History  Problem Relation Age of Onset  . Breast cancer Sister 25  . Thyroid cancer Mother   . Uterine cancer Paternal Grandmother 37    Social History Social History   Tobacco Use  . Smoking status: Former Research scientist (life sciences)  . Smokeless tobacco: Never Used  Substance Use Topics  . Alcohol use: Yes    Comment: wine 2x/wk  . Drug use: No     Allergies   Sulfa antibiotics   Review of Systems Review of Systems  All other systems reviewed and are negative.    Physical Exam Updated Vital Signs BP (!) 123/92   Pulse 97   Temp 98 F (36.7 C) (Oral)   Resp 19   Ht 5\' 6"  (1.676 m)   Wt 87 kg   SpO2 97%   BMI 30.96 kg/m   Physical Exam Vitals signs and nursing note reviewed.  Constitutional:      General: She is not in acute distress.    Appearance: She is well-developed.  HENT:     Head: Normocephalic and atraumatic.  Neck:     Musculoskeletal: Normal range of motion.  Cardiovascular:     Rate and Rhythm: Normal rate and regular rhythm.     Heart sounds: Normal heart sounds.  Pulmonary:     Effort: Pulmonary effort is normal.     Breath sounds: Normal breath sounds.  Abdominal:     General: There is no distension.     Palpations: Abdomen is soft.     Tenderness: There is no abdominal tenderness.  Musculoskeletal: Normal range of motion.  Skin:    General: Skin is warm and dry.  Neurological:     Mental Status: She is alert and oriented to person, place, and time.  Psychiatric:        Judgment: Judgment normal.      ED Treatments / Results  Labs (all labs ordered are listed, but only abnormal results are displayed) Labs Reviewed  CBC - Abnormal; Notable for the following components:      Result Value   RBC 3.79 (*)    Hemoglobin 11.0 (*)    HCT 35.6 (*)    RDW 15.7 (*)     All other components within normal limits  BASIC METABOLIC PANEL - Abnormal; Notable for the following components:   Glucose, Bld 194 (*)    All other components within normal limits  I-STAT TROPONIN, ED    EKG EKG Interpretation  Date/Time:  Monday March 28 2018 11:35:39 EST Ventricular Rate:  120 PR Interval:  132 QRS Duration: 68 QT Interval:  284 QTC Calculation: 401  R Axis:   71 Text Interpretation:  Sinus tachycardia Right atrial enlargement Nonspecific T wave abnormality Abnormal ECG No significant change was found Confirmed by Jola Schmidt [phone]) on 03/28/2018 12:03:31 PM   Radiology Dg Chest 2 View  Result Date: 03/28/2018 CLINICAL DATA:  Pt reports SOB chest pain that radiates all over her chest that started on SAT. Pt was DX with pericarditis in [place] 2019. Pt states she has fluid on her lungs. hc of pericardial effusion, HTN. Pt denies any other symptoms. chest pain EXAM: CHEST - 2 VIEW COMPARISON:  None. FINDINGS: Normal cardiac silhouette. Mild bibasilar atelectasis. No effusion, infiltrate or pneumothorax. No acute osseous abnormality. IMPRESSION: 1. Mild bibasilar atelectasis. 2. Lungs otherwise clear. Electronically Signed   By: Suzy Bouchard M.D.   On: 03/28/2018 13:28    Procedures Procedures (including critical care time)  Medications Ordered in ED Medications  ketorolac (TORADOL) 15 MG/ML injection 15 mg (15 mg Intravenous Given 03/28/18 1509)  morphine 4 MG/ML injection 4 mg (4 mg Intravenous Given 03/28/18 1509)     Initial Impression / Assessment and Plan / ED Course  I have reviewed the triage vital signs and the nursing notes.  Pertinent labs & imaging results that were available during my care of the patient were reviewed by me and considered in my medical decision making (see chart for details).    Recurrent symptoms concerning for pericarditis.  Repeat echocardiogram at this time to evaluate for pericardial effusion.  Cardiology consultation.  Pain  treated with morphine and Toradol.  Final Clinical Impressions(s) / ED Diagnoses   Final diagnoses:  None    ED Discharge Orders    None       Jola Schmidt, MD 03/28/18 1539

## 2018-03-28 NOTE — ED Triage Notes (Signed)
Pt reports MID chest pain. That started on SAT. Pt was DX with pericarditis in [place] 2019.

## 2018-03-28 NOTE — Telephone Encounter (Signed)
I would advise she return to the ER for evaluation given recurrent sx despite steroid taper. Needs repeat labs, CXR and possible echo to evaluate for recurrent effusion. Abijah Roussel PA-C

## 2018-03-28 NOTE — Telephone Encounter (Signed)
Stephanie Dudley reports constant chest pain (3/10 on the pain scale) in the middle of her chest since Saturday. It is worse when she breathes heavily.  She takes ibuprofen 400 mg and the pain subsides for about 2 hours. She was evaluated by Melina Copa 12/23 for pericarditis. She states the pain feels exactly the same but it is in the center of her chest. At this point, she has completed her steroid taper and is still taking colchicine.  She has not been contacted by Rheumatology yet to schedule an appointment (DD referred her for recurrent pericarditis without obvious viral or med trigger).   She understands she will be called with Dayna's recommendations.

## 2018-03-28 NOTE — ED Notes (Signed)
Dinner Tray Ordered @ 1720-per RN-called by Jeffie Widdowson  

## 2018-03-28 NOTE — ED Notes (Signed)
Patient ambulated to restroom unassisted.

## 2018-03-28 NOTE — H&P (Addendum)
Cardiology History & Physical    Patient ID: Stephanie Dudley MRN: [id number], DOB: 07-Mar-1956 Date of Encounter: 03/28/2018, 4:13 PM Primary Physician: Glendale Chard, MD Primary Cardiologist: Ena Dawley, MD Primary Electrophysiologist:  None  Chief Complaint: chest pain Reason for Admission: chest pain Requesting MD: Dr. Venora Maples  HPI: Stephanie Dudley is a 63 y.o. female with history of HTN, HLD (followed by PCP), MVP, hypothyroidism,breast CAs/p ofright lumpectomy with seed, recent recurrent pericarditis with pericardial effusion who presented to [facility] with recurrent chest pain.  To recap, she was admitted 01/2018 for idiopathic pericarditis with elevated CRP/ESR, and diffuse ST elevation on EKG. 2D echo showed EF 65-70%, otherwise normal without pericardial effusion. She saw Lyda Jester 03/01/18 for follow up.She had completed 2-week course of ibuprofen. Symptoms improved but never resolved so she was started onindomethacin 25 mg TID for the next 2 weeks, followed by a taper, with recomendation to otherwise continue colchcine x 3 months. However, she presented back to the hospital 03/09/18 with right sided chest pain worse with any position, coughing, yawning and deep breathing. D-dimer was elevated. CT angiogram of the chest was negative for PE but did show vascular congestion and small pericardial effusion. She was also mildly tachycardic. Sed rate was further elevated and she had borderline rheumatoid factor. ANA was negative. TSH was borderline low but free T4 was normal. She was treated with toradol and prednisone. She was sent home with steroid taper over the course of 12 days. I saw her in follow-up 03/14/18 and she was doing very well. We reduced her statin to allow for the colchicine continuation. Her ESR and CRP were still elevated but coming down. She was referred to rheumatology to evaluate the abnormal RF and recurrent pericarditis but rheumatology reviewed  her chart and declined the referral.  She states she's been feeling great since the office visit until this weekend. On Friday evening she noticed a vague recurrence of the same chest pain, this time across her whole chest. By Saturday it was constant. She would take ibuprofen, she would feel great for about 3-4 hours, then it would recur requiring q6hr dosing. She had trouble sleeping because the pain was worse lying back or on her side. It was also worse with inspiration, coughing, and exertion. She also noticed increased SOB. She called into the office with these symptoms today and was advised to come to the ER. She is again mildly tachycardic, with normal BP and pulse ox. Preliminary labs are stable with normal troponin/WBC, mild anemia with Hgb 11.0, and normal renal function. CXR shows mild bibasilar atelectasis but lungs otherwise clear, normal pericardial silhouette. She received '15mg'$  of toradol and '4mg'$  of morphine with improvement in symptoms but can still feel pain on inspiration. She denies any other pertinent ROS and affirms she was feeling great up until this weekend, otherwise no recent illnesses.  Past Medical History:  Diagnosis Date  . Arthritis   . Breast mass, right 08/2017   Benign tissue  . Complication of anesthesia    hard to wake up with thyroid surgery  . Family history of breast cancer   . Family history of thyroid cancer   . Family history of uterine cancer   . Hyperlipidemia   . Hypertension   . Hypothyroidism   . Mitral valve prolapse    a. not seen on echo 01/2018.  Marland Kitchen Pericardial effusion   . Pericarditis   . Peripheral edema    ankles, feet  Surgical History:  Past Surgical History:  Procedure Laterality Date  . APPENDECTOMY  1994  . BREAST BIOPSY Left 1999  . BREAST LUMPECTOMY WITH RADIOACTIVE SEED LOCALIZATION Right 09/28/2017   Procedure: RIGHT BREAST LUMPECTOMY WITH RADIOACTIVE SEED LOCALIZATION;  Surgeon: Erroll Luna, MD;  Location: [place];  Service: General;  Laterality: Right;  . BREAST SURGERY    . KNEE ARTHROSCOPY Left   . MIDDLE EAR SURGERY Left 2011  . THYROIDECTOMY  1974   goiter--total thyroidectomy  . TONSILLECTOMY  1972  . WRIST FRACTURE SURGERY       Home Meds: Prior to Admission medications   Medication Sig Start Date End Date Taking? Authorizing Provider  colchicine 0.6 MG tablet Take 1 tablet (0.6 mg total) by mouth 2 (two) times daily. 03/14/18 04/13/18 Yes Dunn, Dayna N, PA-C  furosemide (LASIX) 40 MG tablet Take 40 mg by mouth daily.  09/25/17  Yes [provider]  ibuprofen (ADVIL,MOTRIN) 200 MG tablet Take 400 mg by mouth every 6 (six) hours as needed for moderate pain.   Yes [provider]  pantoprazole (PROTONIX) 40 MG tablet Take 1 tablet (40 mg total) by mouth daily before breakfast. 03/01/18 03/31/18 Yes Rosita Fire, Brittainy M, PA-C  rosuvastatin (CRESTOR) 10 MG tablet Take 1 tablet (10 mg total) by mouth daily. Patient taking differently: Take 5 mg by mouth daily.  03/14/18 06/12/18 Yes Dunn, Dayna N, PA-C  spironolactone (ALDACTONE) 25 MG tablet TAKE ONE TABLET BY MOUTH DAILY Patient taking differently: Take 25 mg by mouth daily.  01/07/18  Yes Glendale Chard, MD  SYNTHROID 88 MCG tablet Take 1 tablet (88 mcg total) by mouth daily. 02/16/18  Yes Glendale Chard, MD  valsartan (DIOVAN) 160 MG tablet TAKE ONE TABLET BY MOUTH DAILY Patient taking differently: Take 160 mg by mouth daily.  01/07/18  Yes Glendale Chard, MD    Allergies:  Allergies  Allergen Reactions  . Sulfa Antibiotics Swelling    Social History   Socioeconomic History  . Marital status: Married    Spouse name: Not on file  . Number of children: Not on file  . Years of education: Not on file  . Highest education level: Not on file  Occupational History  . Not on file  Social Needs  . Financial resource strain: Not on file  . Food insecurity:    Worry: Not on file    Inability: Not on file  .  Transportation needs:    Medical: Not on file    Non-medical: Not on file  Tobacco Use  . Smoking status: Former Research scientist (life sciences)  . Smokeless tobacco: Never Used  Substance and Sexual Activity  . Alcohol use: Yes    Comment: wine 2x/wk  . Drug use: No  . Sexual activity: Yes    Birth control/protection: Post-menopausal  Lifestyle  . Physical activity:    Days per week: Not on file    Minutes per session: Not on file  . Stress: Not on file  Relationships  . Social connections:    Talks on phone: Not on file    Gets together: Not on file    Attends religious service: Not on file    Active member of club or organization: Not on file    Attends meetings of clubs or organizations: Not on file    Relationship status: Not on file  . Intimate partner violence:    Fear of current or ex partner: Not on file    Emotionally abused:  Not on file    Physically abused: Not on file    Forced sexual activity: Not on file  Other Topics Concern  . Not on file  Social History Narrative  . Not on file     Family History  Problem Relation Age of Onset  . Breast cancer Sister 40  . Thyroid cancer Mother   . Uterine cancer Paternal Grandmother 44    Review of Systems: General: negative for chills, fever, night sweats or weight changes.  Cardiovascular: negative for chest pain, edema, orthopnea, palpitations, paroxysmal nocturnal dyspnea, shortness of breath or dyspnea on exertion Dermatological: negative for rash Respiratory: negative for cough or wheezing Urologic: negative for hematuria Abdominal: negative for nausea, vomiting, diarrhea, bright red blood per rectum, melena, or hematemesis Neurologic: negative for visual changes, syncope, or dizziness All other systems reviewed and are otherwise negative except as noted above.  Labs:   Lab Results  Component Value Date   WBC 8.6 03/28/2018   HGB 11.0 (L) 03/28/2018   HCT 35.6 (L) 03/28/2018   MCV 93.9 03/28/2018   PLT 250 03/28/2018      Recent Labs  Lab 03/28/18 1200  NA 136  K 3.7  CL 101  CO2 23  BUN 8  CREATININE 0.88  CALCIUM 9.0  GLUCOSE 194*   No results for input(s): CKTOTAL, CKMB, TROPONINI in the last 72 hours. Lab Results  Component Value Date   CHOL 206 (H) 01/28/2018   HDL 59 01/28/2018   LDLCALC 78 01/28/2018   TRIG 346 (H) 01/28/2018   Lab Results  Component Value Date   DDIMER 1.90 (H) 03/09/2018    Radiology/Studies:  Dg Chest 2 View  Result Date: 03/28/2018 CLINICAL DATA:  Pt reports SOB chest pain that radiates all over her chest that started on SAT. Pt was DX with pericarditis in [place] 2019. Pt states she has fluid on her lungs. hc of pericardial effusion, HTN. Pt denies any other symptoms. chest pain EXAM: CHEST - 2 VIEW COMPARISON:  None. FINDINGS: Normal cardiac silhouette. Mild bibasilar atelectasis. No effusion, infiltrate or pneumothorax. No acute osseous abnormality. IMPRESSION: 1. Mild bibasilar atelectasis. 2. Lungs otherwise clear. Electronically Signed   By: Suzy Bouchard M.D.   On: 03/28/2018 13:28   Dg Chest 2 View  Result Date: 03/09/2018 CLINICAL DATA:  Onset of right-sided chest pain yesterday with pleuritic component as well as positional component. There is palpable chest discomfort. Similar symptoms last month at which time the patient was diagnosed with pericarditis. EXAM: CHEST - 2 VIEW COMPARISON:  PA and lateral chest x-ray of February 01, 2018 and CT scan of the chest of the same date FINDINGS: The lungs are reasonably well inflated. Linear increased density as developed at both bases slightly greater on the left. There is no pleural effusion or pneumothorax. The cardiac silhouette is enlarged. The pulmonary vascularity is not engorged. There is no pleural effusion. The mediastinum is normal in width. The bony thorax exhibits no acute abnormality. IMPRESSION: Bibasilar atelectasis new since the previous study. No pleural effusion or alveolar infiltrate. Stable enlargement  of the cardiac silhouette which could reflect an underlying pericardial effusion. No overt pulmonary edema. Electronically Signed   By: David  Martinique M.D.   On: 03/09/2018 09:29   Ct Angio Chest Pe W And/or Wo Contrast  Result Date: 03/09/2018 CLINICAL DATA:  Left chest pain EXAM: CT ANGIOGRAPHY CHEST WITH CONTRAST TECHNIQUE: Multidetector CT imaging of the chest was performed using the standard protocol during bolus  administration of intravenous contrast. Multiplanar CT image reconstructions and MIPs were obtained to evaluate the vascular anatomy. CONTRAST:  60m ISOVUE-370 IOPAMIDOL (ISOVUE-370) INJECTION 76% COMPARISON:  02/01/2018 FINDINGS: Cardiovascular: No filling defects in the pulmonary arteries to suggest pulmonary emboli. Mild cardiomegaly. Aorta is normal caliber. Small pericardial effusion. Mediastinum/Nodes: No mediastinal, hilar, or axillary adenopathy. Lungs/Pleura: Linear bibasilar atelectasis. Mild vascular congestion. No effusions. Upper Abdomen: Imaging into the upper abdomen shows no acute findings. Musculoskeletal: Chest wall soft tissues are unremarkable. No acute bony abnormality. Review of the MIP images confirms the above findings. IMPRESSION: No evidence of pulmonary embolus. Cardiomegaly, vascular congestion. Small pericardial effusion. Bibasilar atelectasis. Electronically Signed   By: KRolm BaptiseM.D.   On: 03/09/2018 09:54   Wt Readings from Last 3 Encounters:  03/28/18 87 kg  03/14/18 87 kg  03/09/18 82.1 kg    EKG: sinus tach 120bpm with right atrial enlargement, nonspecific ST-T changes, no acute ST elevation  Physical Exam: Blood pressure (!) 123/92, pulse 97, temperature 98 F (36.7 C), temperature source Oral, resp. rate 19, height '5\' 6"'$  (1.676 m), weight 87 kg, SpO2 97 %. Body mass index is 30.96 kg/m. General: Well developed, well nourished AAF in no acute distress. Head: Normocephalic, atraumatic, sclera non-icteric, no xanthomas, nares are without  discharge.  Neck: Negative for carotid bruits. JVD not elevated. Lungs: Clear bilaterally to auscultation without wheezes, rales, or rhonchi. Breathing is unlabored. Heart: Reg rhyth, mildly tachycardic, with S1 S2. No murmurs, rubs, or gallops appreciated. Abdomen: Soft, non-tender, non-distended with normoactive bowel sounds. No hepatomegaly. No rebound/guarding. No obvious abdominal masses. Msk:  Strength and tone appear normal for age. Extremities: No clubbing or cyanosis. No edema.  Distal pedal pulses are 2+ and equal bilaterally. Neuro: Alert and oriented X 3. No focal deficit. No facial asymmetry. Moves all extremities spontaneously. Psych:  Responds to questions appropriately with a normal affect.    Assessment and Plan   1. Recurrent pleuritic chest pain with recent hx of pericarditis - symptoms reminiscent of prior pericarditis. As in prior episodes she is also mildly tachycardic. Await CRP, ESR and 2d echo as ordered by the ED. We will admit and begin prednisone '40mg'$  daily while inpatient with a plan for a longer taper (UpToDate suggests 3 months). She did not tolerate higher doses due to jitteriness. Of note, she is not hypoxic and has no signs of DVT on exam. Continue colchicine.  2. Sinus tachycardia - likely related to the above.   3. Hypothyroidism - recent TSH suppressed but f/u free T4 wnl. Pending with repeat labs.  4. Hyperlipidemia - followed by primary care. Crestor dose recently reduced in half while on colchicine due to drug interaction (increases statin levels).  Severity of Illness: The appropriate patient status for this patient is OBSERVATION. Observation status is judged to be reasonable and necessary in order to provide the required intensity of service to ensure the patient's safety. The patient's presenting symptoms, physical exam findings, and initial radiographic and laboratory data in the context of their medical condition is felt to place them at decreased  risk for further clinical deterioration. Furthermore, it is anticipated that the patient will be medically stable for discharge from the hospital within 2 midnights of admission. The following factors support the patient status of observation.   " The patient's presenting symptoms include chest pain. " The physical exam findings include tachycardia. " The initial radiographic and laboratory data are negative for acute CP disease in CXR but EKG c/w  sinus tach    For questions or updates, please contact [place] Please consult [email or web address] for contact info under Cardiology/STEMI.  Signed, Charlie Pitter, PA-C 03/28/2018, 4:13 PM

## 2018-03-28 NOTE — Telephone Encounter (Signed)
New Message  Pt c/o of Chest Pain: STAT if CP now or developed within 24 hours  1. Are you having CP right now? yes  2. Are you experiencing any other symptoms (ex. SOB, nausea, vomiting, sweating)? no  3. How long have you been experiencing CP? Since Saturday 03/26/2018  4. Is your CP continuous or coming and going? Continuous   5. Have you taken Nitroglycerin? no ?

## 2018-03-28 NOTE — Telephone Encounter (Signed)
Instructed patient to proceed to ER for evaluation. She was grateful for assistance.

## 2018-03-29 ENCOUNTER — Ambulatory Visit (HOSPITAL_BASED_OUTPATIENT_CLINIC_OR_DEPARTMENT_OTHER): Payer: Federal, State, Local not specified - PPO

## 2018-03-29 ENCOUNTER — Other Ambulatory Visit (HOSPITAL_COMMUNITY): Payer: Federal, State, Local not specified - PPO

## 2018-03-29 ENCOUNTER — Telehealth: Payer: Self-pay

## 2018-03-29 ENCOUNTER — Other Ambulatory Visit: Payer: Self-pay

## 2018-03-29 DIAGNOSIS — I10 Essential (primary) hypertension: Secondary | ICD-10-CM | POA: Diagnosis not present

## 2018-03-29 DIAGNOSIS — I300 Acute nonspecific idiopathic pericarditis: Secondary | ICD-10-CM | POA: Diagnosis not present

## 2018-03-29 DIAGNOSIS — E785 Hyperlipidemia, unspecified: Secondary | ICD-10-CM | POA: Diagnosis not present

## 2018-03-29 DIAGNOSIS — R000 Tachycardia, unspecified: Secondary | ICD-10-CM | POA: Diagnosis not present

## 2018-03-29 DIAGNOSIS — R0602 Shortness of breath: Secondary | ICD-10-CM | POA: Diagnosis not present

## 2018-03-29 LAB — BASIC METABOLIC PANEL
Anion gap: 10 (ref 5–15)
BUN: 10 mg/dL (ref 8–23)
CHLORIDE: 106 mmol/L (ref 98–111)
CO2: 24 mmol/L (ref 22–32)
Calcium: 9.7 mg/dL (ref 8.9–10.3)
Creatinine, Ser: 0.88 mg/dL (ref 0.44–1.00)
GFR calc Af Amer: >60 mL/min (ref >60)
GFR calc non Af Amer: >60 mL/min (ref >60)
Glucose, Bld: 161 mg/dL — ABNORMAL HIGH (ref 70–99)
Potassium: 4.8 mmol/L (ref 3.5–5.1)
Sodium: 140 mmol/L (ref 135–145)

## 2018-03-29 LAB — ECHOCARDIOGRAM LIMITED
HEIGHTINCHES: 66 in
WEIGHTICAEL: 3068.8 [oz_av]

## 2018-03-29 LAB — CBC
HCT: 36.2 % (ref 36.0–46.0)
Hemoglobin: 11.2 g/dL — ABNORMAL LOW (ref 12.0–15.0)
MCH: 29.1 pg (ref 26.0–34.0)
MCHC: 30.9 g/dL (ref 30.0–36.0)
MCV: 94 fL (ref 80.0–100.0)
Platelets: 222 10*3/uL (ref 150–400)
RBC: 3.85 MIL/uL — ABNORMAL LOW (ref 3.87–5.11)
RDW: 15.9 % — ABNORMAL HIGH (ref 11.5–15.5)
WBC: 5.3 10*3/uL (ref 4.0–10.5)
nRBC: 0 % (ref 0.0–0.2)

## 2018-03-29 MED ORDER — PREDNISONE 10 MG PO TABS: ORAL_TABLET | ORAL | 0 refills | Status: AC

## 2018-03-29 NOTE — Telephone Encounter (Signed)
**Note De-Identified Stephanie Dudley Obfuscation** Patient contacted regarding discharge from [facility] on 03/29/2018.  Patient understands to follow up with Melina Copa PA-c on 04/07/2018 at 12:00 at [facility] in [place], [place]. Patient understands discharge instructions? Yes Patient understands medications and regiment? Yes Patient understands to bring all medications to this visit? Yes

## 2018-03-29 NOTE — Progress Notes (Signed)
Pt began showing ST 160bpm on cardiac monitor. Nurse entered room and patient was alert and oriented x4, pt stated felt hot (no visible sweating). Pt showed no signs of acute distress, denied chest pain, or n/v. Vitals taken, PT was hypertensive. Pt states this usually happens at home and states "feels like she ran a race but only lasts couple minutes." Provider notified.

## 2018-03-29 NOTE — Progress Notes (Signed)
Progress Note  Patient Name: Stephanie Dudley Date of Encounter: 03/29/2018  Primary Cardiologist: Ena Dawley, MD  Subjective   CP is quite improved. Still feels some with inspiration and position changes but not nearly as bad. Heart raced overnight and she felt hot - monitor shows gradual increase of sinus tach but will review tele with MD. She feels she might have been symptomatic with BP spikes as well.  Inpatient Medications    Scheduled Meds: . colchicine  0.6 mg Oral BID  . furosemide  40 mg Oral Daily  . irbesartan  150 mg Oral Daily  . levothyroxine  88 mcg Oral Q0600  . pantoprazole  40 mg Oral QAC breakfast  . predniSONE  40 mg Oral Daily  . rosuvastatin  5 mg Oral Daily  . sodium chloride flush  3 mL Intravenous Q12H  . spironolactone  25 mg Oral Daily   Continuous Infusions: . sodium chloride     PRN Meds: sodium chloride, acetaminophen, ondansetron (ZOFRAN) IV, sodium chloride flush   Vital Signs    Vitals:   03/29/18 0400 03/29/18 0450 03/29/18 0451 03/29/18 0622  BP:  (!) 170/104 (!) 184/107 138/84  Pulse:  (!) 112  (!) 101  Resp: 18 (!) 21  19  Temp: 98 F (36.7 C) 98 F (36.7 C)    TempSrc: Oral Oral    SpO2: 96% 96%    Weight:      Height:       No intake or output data in the 24 hours ending 03/29/18 0937 Filed Weights   03/28/18 1147  Weight: 87 kg    Telemetry    Sinus tach - Personally Reviewed  ECG    NSR without acute changes, question of subtle PR depression inferiorly - Personally Reviewed  Physical Exam   GEN: No acute distress.  HEENT: Normocephalic, atraumatic, sclera non-icteric. Neck: No JVD or bruits. Cardiac: Reg rhythm borderline tachycardic, no murmurs, rubs, or gallops.  Radials/DP/PT 1+ and equal bilaterally.  Respiratory: Clear to auscultation bilaterally. Breathing is unlabored. GI: Soft, nontender, non-distended, BS +x 4. MS: no deformity. Extremities: No clubbing or cyanosis. No edema. Distal pedal  pulses are 2+ and equal bilaterally. Neuro:  AAOx3. Follows commands. Psych:  Responds to questions appropriately with a normal affect.  Labs    Chemistry Recent Labs  Lab 03/28/18 1200 03/29/18 0539  NA 136 140  K 3.7 4.8  CL 101 106  CO2 23 24  GLUCOSE 194* 161*  BUN 8 10  CREATININE 0.88 0.88  CALCIUM 9.0 9.7  GFRNONAA >60 >60  GFRAA >60 >60  ANIONGAP 12 10     Hematology Recent Labs  Lab 03/28/18 1200 03/29/18 0539  WBC 8.6 5.3  RBC 3.79* 3.85*  HGB 11.0* 11.2*  HCT 35.6* 36.2  MCV 93.9 94.0  MCH 29.0 29.1  MCHC 30.9 30.9  RDW 15.7* 15.9*  PLT 250 222    Cardiac EnzymesNo results for input(s): TROPONINI in the last 168 hours.  Recent Labs  Lab 03/28/18 1231  TROPIPOC 0.00     BNPNo results for input(s): BNP, PROBNP in the last 168 hours.   DDimer No results for input(s): DDIMER in the last 168 hours.   Radiology    Dg Chest 2 View  Result Date: 03/28/2018 CLINICAL DATA:  Pt reports SOB chest pain that radiates all over her chest that started on SAT. Pt was DX with pericarditis in [place] 2019. Pt states she has fluid on her  lungs. hc of pericardial effusion, HTN. Pt denies any other symptoms. chest pain EXAM: CHEST - 2 VIEW COMPARISON:  None. FINDINGS: Normal cardiac silhouette. Mild bibasilar atelectasis. No effusion, infiltrate or pneumothorax. No acute osseous abnormality. IMPRESSION: 1. Mild bibasilar atelectasis. 2. Lungs otherwise clear. Electronically Signed   By: Suzy Bouchard M.D.   On: 03/28/2018 13:28    Cardiac Studies   Pending limited echo.  Patient Profile     63 y.o. female with HTN, HLD (followed by PCP), MVP, hypothyroidism,breast CAs/p ofright lumpectomy with seed, recent recurrent pericarditis with pericardial effusion who presented with chest pain suspicious for recurrent pericarditis.  Assessment & Plan    1. Recurrent pleuritic chest pain with recent hx of pericarditis - symptoms reminiscent of prior pericarditis. As in  prior episodes she is also mildly tachycardic. Await CRP, ESR and 2d echo as ordered by the ED. She has noticed symptomatic improvement with prednisone re-initiation. Anticipate longer taper over 3 months' time. She did not tolerate higher doses of prednisone due to jitteriness. Of note, she is not hypoxic and has no signs of DVT on exam. Continue colchicine.  2. Sinus tachycardia - likely related to the above. Will have MD review telemetry given episode of HR 160s.  3. HTN - BP spikes noted overnight. May need to increase regimen to account for possible increase while on prednisone.  4. Hypothyroidism - recent TSH suppressed but f/u free T4 wnl. Repeat TSH this admission normal.  5. Hyperlipidemia - followed by primary care. Crestor dose was recently reduced in half while on colchicine due to drug interaction (increases statin levels).  For questions or updates, please contact [place] Please consult [email or web address] for contact info under Cardiology/STEMI.  Signed, Charlie Pitter, PA-C 03/29/2018, 9:37 AM

## 2018-03-29 NOTE — Progress Notes (Signed)
2D Echocardiogram has been performed.  Stephanie Dudley 03/29/2018, 11:04 AM

## 2018-03-29 NOTE — Telephone Encounter (Signed)
-----   Message from Loren Racer, LPN sent at 07/25/8626  2:01 PM EST ----- Regarding: FW: Being discharged today, will need TOC call  ----- Message ----- From: Charlie Pitter, PA-C Sent: 03/29/2018   1:51 PM EST To: [facility] Triage Subject: Being discharged today, will need TOC call     Already has f/u scheduled with me on 1/16, just needs phone call after she goes home. Came in with recurrent pericarditis. Thanks.

## 2018-03-29 NOTE — Discharge Summary (Signed)
Discharge Summary    Patient ID: Stephanie Dudley,  MRN: [id number], DOB/AGE: 12-11-1955 63 y.o.  Admit date: 03/28/2018 Discharge date: 03/29/2018  Primary Care Provider: Glendale Chard Primary Cardiologist: Ena Dawley, MD Primary Electrophysiologist:  None  Discharge Diagnoses    Principal Problem:   Recurrent idiopathic pericarditis Active Problems:   Hyperlipidemia   Hypertension   Hypothyroidism   Pericarditis   Diagnostic Studies/Procedures    Limited 2D echo 03/29/18 Study Conclusions  - Left ventricle: The cavity size was normal. Systolic function was   normal. The estimated ejection fraction was in the range of 60%   to 65%. Wall motion was normal; there were no regional wall   motion abnormalities. - Pericardium, extracardiac: A possible, trivial, free-flowing   pericardial effusion was identified along the right ventricular   free wall and at the apex.   _____________     History of Present Illness     Stephanie Dudley is a 63 y.o. female with history of HTN, HLD (followed by PCP), MVP, hypothyroidism,breast CAs/p ofright lumpectomy with seed, recent recurrent pericarditis with pericardial effusion who presented to [facility] with recurrent chest pain.  To recap, she was admitted11/2019for idiopathic pericarditis with elevated CRP/ESR, and diffuse ST elevation on EKG. 2D echo showed EF 65-70%, otherwise normal without pericardial effusion. She saw Lyda Jester 03/01/18 for follow up.She had completed 2-week course of ibuprofen. Symptoms improved but never resolved so she was started onindomethacin 25 mg TID for the next 2 weeks, followed by a taper, with recomendation to otherwise continue colchcine x 3 months. However, she presented back to the hospital 03/09/18 with right sided chest pain worsewith any position, coughing, yawningand deep breathing. D-dimer was elevated. CT angiogram of the chest was negative for PE but did show vascular  congestion and small pericardial effusion. She was also mildly tachycardic. Sed rate was further elevated and she had borderline rheumatoid factor. ANA was negative. TSH was borderline low but free T4 was normal. She was treated with toradol and prednisone. She was sent home with steroid taper over the course of 15 days. I saw her in follow-up 03/14/18 and she was doing very well. We reduced her statin to allow for the colchicine continuation given the drug interaction. Her ESR and CRP were still elevated but coming down. She was referred to rheumatology to evaluate the abnormal RF and recurrent pericarditis but rheumatology reviewed her chart and declined the referral.  She had been feeling fantastic since the 12/23 office visit until this weekend. On Friday evening 03/25/18 she noticed a vague recurrence of the same chest pain, this time across her whole chest. By Saturday it was constant. She would take ibuprofen and would feel great for about 3-4 hours, then it would recur requiring q6hr dosing. She had trouble sleeping because the pain was worse lying back or on her side. It was also worse with inspiration, coughing, and exertion. She also noticed increased SOB. She called into the office with these symptoms today and was advised to come to the ER. She was again mildly tachycardic, with normal BP and pulse ox. Labs showed normal troponin and WBC, with mild stable anemia with Hgb 11.0 and normal renal function. CXR showed mild bibasilar atelectasis but lungs otherwise clear, normal pericardial silhouette. She received '15mg'$  of toradol and '4mg'$  of morphine with improvement in symptoms but could still feel pain on inspiration. She denied any other pertinent ROS and affirmed she was feeling great up until  this weekend, otherwise no recent illnesses. She was admitted for further eval and pain management.  Hospital Course    1. Recurrent pleuritic chest pain with recent hx of pericarditis- symptoms reminiscent of  prior pericarditis. As in prior episodes she was also mildly tachycardic. CRP and ESR were again elevated, but slightly lower than previous inpatient values. She was started on prednisone '40mg'$  (did not tolerate the prior taper that began at '60mg'$  due to jitteriness). UpToDate suggests 3 month taper but given her h/o intolerance do not think she will tolerate that long. Anticipate a 6 week taper as outlined below - '40mg'$  x 1 week, '30mg'$  x 1 week, '20mg'$  x 1 week, '10mg'$  x 1 week, then '5mg'$  x 2 weeks then stop. We will also continue colchicine. At follow-up, consider refilling to complete a 6 month course given recurrence. 2D echo showed EF 60-65% with possible trivial, free-flowing pericardial effusion was identified along the right ventricular free wall and at the apex. She was advised to d/c ibuprofen while on prednisone. She will continue PPI.  2. Sinus tachycardia- likely related to the above. She was tachycardic further briefly overnight to the 160s but this was while feeling hot/flushed. Telemetry was consistent with sinus tachycardia, no other acute arrhythmias. DC HR in the 90s-low 100s.  3. HTN - BP spikes noted overnight. May need to increase regimen to account for possible increase while on prednisone. The patient was instructed to monitor their blood pressure at home and to call if tending to run higher than 884 systolic or 80 diastolic. She could double up her ARB if needed vs addition of BB.  4. Hypothyroidism- recent TSH suppressed but f/u free T4 wnl. Repeat TSH this admission normal.  5. Hyperlipidemia - followed by primary care. Crestor dose was recently reduced in half while on colchicine due to drug interaction (increases statin levels).  We will plan for TOC f/u 04/07/18. Dr. Debara Pickett has seen and examined the patient today and feels she is stable for discharge. _____________  Discharge Vitals Blood pressure (!) 149/93, pulse (!) 101, temperature 98.6 F (37 C), temperature source Oral,  resp. rate 18, height '5\' 6"'$  (1.676 m), weight 87 kg, SpO2 95 %.  Filed Weights   03/28/18 1147  Weight: 87 kg    Labs & Radiologic Studies    CBC Recent Labs    03/28/18 1200 03/29/18 0539  WBC 8.6 5.3  HGB 11.0* 11.2*  HCT 35.6* 36.2  MCV 93.9 94.0  PLT 250 166   Basic Metabolic Panel Recent Labs    03/28/18 1200 03/29/18 0539  NA 136 140  K 3.7 4.8  CL 101 106  CO2 23 24  GLUCOSE 194* 161*  BUN 8 10  CREATININE 0.88 0.88  CALCIUM 9.0 9.7   Thyroid Function Tests Recent Labs    03/28/18 1550  TSH 0.745   _____________  Dg Chest 2 View  Result Date: 03/28/2018 CLINICAL DATA:  Pt reports SOB chest pain that radiates all over her chest that started on SAT. Pt was DX with pericarditis in [place] 2019. Pt states she has fluid on her lungs. hc of pericardial effusion, HTN. Pt denies any other symptoms. chest pain EXAM: CHEST - 2 VIEW COMPARISON:  None. FINDINGS: Normal cardiac silhouette. Mild bibasilar atelectasis. No effusion, infiltrate or pneumothorax. No acute osseous abnormality. IMPRESSION: 1. Mild bibasilar atelectasis. 2. Lungs otherwise clear. Electronically Signed   By: Suzy Bouchard M.D.   On: 03/28/2018 13:28   Dg Chest 2  View  Result Date: 03/09/2018 CLINICAL DATA:  Onset of right-sided chest pain yesterday with pleuritic component as well as positional component. There is palpable chest discomfort. Similar symptoms last month at which time the patient was diagnosed with pericarditis. EXAM: CHEST - 2 VIEW COMPARISON:  PA and lateral chest x-ray of February 01, 2018 and CT scan of the chest of the same date FINDINGS: The lungs are reasonably well inflated. Linear increased density as developed at both bases slightly greater on the left. There is no pleural effusion or pneumothorax. The cardiac silhouette is enlarged. The pulmonary vascularity is not engorged. There is no pleural effusion. The mediastinum is normal in width. The bony thorax exhibits no acute  abnormality. IMPRESSION: Bibasilar atelectasis new since the previous study. No pleural effusion or alveolar infiltrate. Stable enlargement of the cardiac silhouette which could reflect an underlying pericardial effusion. No overt pulmonary edema. Electronically Signed   By: David  Martinique M.D.   On: 03/09/2018 09:29   Ct Angio Chest Pe W And/or Wo Contrast  Result Date: 03/09/2018 CLINICAL DATA:  Left chest pain EXAM: CT ANGIOGRAPHY CHEST WITH CONTRAST TECHNIQUE: Multidetector CT imaging of the chest was performed using the standard protocol during bolus administration of intravenous contrast. Multiplanar CT image reconstructions and MIPs were obtained to evaluate the vascular anatomy. CONTRAST:  33m ISOVUE-370 IOPAMIDOL (ISOVUE-370) INJECTION 76% COMPARISON:  02/01/2018 FINDINGS: Cardiovascular: No filling defects in the pulmonary arteries to suggest pulmonary emboli. Mild cardiomegaly. Aorta is normal caliber. Small pericardial effusion. Mediastinum/Nodes: No mediastinal, hilar, or axillary adenopathy. Lungs/Pleura: Linear bibasilar atelectasis. Mild vascular congestion. No effusions. Upper Abdomen: Imaging into the upper abdomen shows no acute findings. Musculoskeletal: Chest wall soft tissues are unremarkable. No acute bony abnormality. Review of the MIP images confirms the above findings. IMPRESSION: No evidence of pulmonary embolus. Cardiomegaly, vascular congestion. Small pericardial effusion. Bibasilar atelectasis. Electronically Signed   By: KRolm BaptiseM.D.   On: 03/09/2018 09:54   Disposition   Pt is being discharged home today in good condition.  Follow-up Plans & Appointments    Follow-up Information    DCharlie Pitter PA-C Follow up.   Specialties:  Cardiology, Radiology Why:  [facility] - we have moved your follow-up appointment to January 16th as below. Please arrive between 11:30-11:45am to check in. [address] information: [address] [phone]         Discharge Instructions    Diet - low sodium heart healthy   Complete by:  As directed    Discharge instructions   Complete by:  As directed    This time around you will do a longer taper of the prednisone (anti-inflammatory) medication. Patients taking prednisone should generally stay away from medicines like ibuprofen, Advil, Motrin, naproxen, and Aleve due to risk of stomach bleeding as they are similar types of medications.   Your prednisone plan is as follows: 4 tablets ('40mg'$ ) daily for 1 week 3 tablets ('30mg'$ ) daily for 1 week 2 tablets ('20mg'$ ) daily for 1 week 1 tablet ('10mg'$ ) daily for 1 week 1/2 tablet ('5mg'$ ) daily for 2 weeks then stop  Please monitor your blood pressure occasionally at home. Call your doctor if you notice a tendency to get readings of greater than 130 on the top number or 80 on the bottom number.   Increase activity slowly   Complete by:  As directed       Discharge Medications   Allergies as of 03/29/2018  Reactions   Sulfa Antibiotics Swelling      Medication List    STOP taking these medications   ibuprofen 200 MG tablet Commonly known as:  ADVIL,MOTRIN     TAKE these medications   colchicine 0.6 MG tablet Take 1 tablet (0.6 mg total) by mouth 2 (two) times daily.   furosemide 40 MG tablet Commonly known as:  LASIX Take 40 mg by mouth daily.   pantoprazole 40 MG tablet Commonly known as:  PROTONIX Take 1 tablet (40 mg total) by mouth daily before breakfast.   predniSONE 10 MG tablet Commonly known as:  DELTASONE Take 4 tablets (40 mg total) by mouth daily with breakfast for 7 days, THEN 3 tablets (30 mg total) daily with breakfast for 7 days, THEN 2 tablets (20 mg total) daily with breakfast for 7 days, THEN 1 tablet (10 mg total) daily with breakfast for 7 days, THEN 0.5 tablets (5 mg total) daily with breakfast for 14 days. Start taking on:  March 29, 2018   rosuvastatin 20 MG tablet Commonly known  as:  CRESTOR Take 10 mg by mouth daily.   spironolactone 25 MG tablet Commonly known as:  ALDACTONE TAKE ONE TABLET BY MOUTH DAILY   SYNTHROID 88 MCG tablet Generic drug:  levothyroxine Take 1 tablet (88 mcg total) by mouth daily.   valsartan 160 MG tablet Commonly known as:  DIOVAN TAKE ONE TABLET BY MOUTH DAILY        Allergies:  Allergies  Allergen Reactions  . Sulfa Antibiotics Swelling    Outstanding Labs/Studies   N/A  Duration of Discharge Encounter   Greater than 30 minutes including physician time.  Signed, Nedra Hai Jaysten Essner PA-C 03/29/2018, 2:20 PM

## 2018-04-05 NOTE — Progress Notes (Signed)
Cardiology Office Note    Date:  04/07/2018  ID:  Stephanie Dudley, DOB 18-Oct-1955, MRN [id number] PCP:  Glendale Chard, MD  Cardiologist:  Ena Dawley, MD   Chief Complaint: f/u pericarditis  History of Present Illness:  Stephanie Dudley is a 63 y.o. female with history of HTN, HLD (followed by PCP), MVP, hypothyroidism,breast CAs/p ofright lumpectomy with seed,recent recurrentpericarditis with pericardial effusion who presents to f/u pericarditis.  To recap, she wasadmitted11/2019for idiopathic pericarditiswithelevated CRP/ESR and diffuse ST elevation on EKG.2D echo showed EF 65-70%, otherwise normal without pericardial effusion. She saw Lyda Jester 03/01/18 for follow up.She had completed 2-week course of ibuprofen. Symptoms improved but never resolved so she was started onindomethacin 25 mg TID for the next 2 weeks, followed by a taper, with recomendation to otherwise continue colchcine x 3 months. However, shepresented back to the hospital 03/09/18 with right sided chest pain worsewith any position, coughing, yawningand deep breathing. D-dimer was elevated.CT angiogram of the chest was negative for PE but did show vascular congestion and small pericardial effusion. She was alsomildly tachycardic. Sed rate was further elevated and she had borderline rheumatoid factor. ANA was negative.TSH was borderline low but free T4 was normal.She was treated with toradol and prednisone. She was sent home with steroid taper over the course of 15 days. I saw her in follow-up 03/14/18 and she was doing very well. We reduced her statin to allow for the colchicine continuation given the drug interaction. Her ESR and CRP were still elevated but coming down. She was referred to rheumatology to evaluate the abnormal RF and recurrent pericarditis but rheumatology reviewed her chart and declined the referral. She was readmitted 1/6-03/29/2018 with recurrent pleuritic chest pain, sinus  tach, and elevated CRP/ESR (although lower than prior admission). She had an episode of tachypalpitations while on telemetry and this showed sinus tachycardia with HR 150s (not afib/flutter). She was felt to have recurrent pericarditis and longer steroid taper was recommended over 6 weeks' time. Last labs showed Hgb 11.2, Plt 222, TSH wnl, K 4.8, Cr 0.88, ESR 30, CRP 3.7. 2D echo 03/29/18 showed EF 60-65%, possible trivial free flowing pericardial effusion along RV free wall and apex.  She returns for follow-up feeling better. She is chest pain free! She denies any SOB. She does notice a tendency for her HR to remain on the higher side similar to recent hospitalization. She had some leg pain the other day but this resolved without any intervention; no erythema, edema, or palpable cords. She has noticed her BP running higher. This morning was 170/103 prior to AM meds. She is eating everything in sight because of the prednisone. However, she is delighted to also note that the Protonix has allowed her to eat things she wasn't able to eat before because of reflux - i.e. spaghetti sauce.   Past Medical History:  Diagnosis Date  . Arthritis   . Breast mass, right 08/2017   Benign tissue  . Complication of anesthesia    hard to wake up with thyroid surgery  . Family history of breast cancer   . Family history of thyroid cancer   . Family history of uterine cancer   . Hyperlipidemia   . Hypertension   . Hypothyroidism   . Mitral valve prolapse    a. not seen on echo 01/2018.  Marland Kitchen Pericardial effusion   . Pericarditis   . Peripheral edema    ankles, feet    Past Surgical History:  Procedure Laterality Date  .  APPENDECTOMY  1994  . BREAST BIOPSY Left 1999  . BREAST LUMPECTOMY WITH RADIOACTIVE SEED LOCALIZATION Right 09/28/2017   Procedure: RIGHT BREAST LUMPECTOMY WITH RADIOACTIVE SEED LOCALIZATION;  Surgeon: Erroll Luna, MD;  Location: [facility];  Service: General;  Laterality:  Right;  . BREAST SURGERY    . KNEE ARTHROSCOPY Left   . MIDDLE EAR SURGERY Left 2011  . THYROIDECTOMY  1974   goiter--total thyroidectomy  . TONSILLECTOMY  1972  . WRIST FRACTURE SURGERY      Current Medications: Current Meds  Medication Sig  . colchicine 0.6 MG tablet Take 1 tablet (0.6 mg total) by mouth 2 (two) times daily.  . furosemide (LASIX) 40 MG tablet Take 40 mg by mouth daily.   . pantoprazole (PROTONIX) 40 MG tablet Take 1 tablet (40 mg total) by mouth daily before breakfast.  . predniSONE (DELTASONE) 10 MG tablet Take 4 tablets (40 mg total) by mouth daily with breakfast for 7 days, THEN 3 tablets (30 mg total) daily with breakfast for 7 days, THEN 2 tablets (20 mg total) daily with breakfast for 7 days, THEN 1 tablet (10 mg total) daily with breakfast for 7 days, THEN 0.5 tablets (5 mg total) daily with breakfast for 14 days.  . rosuvastatin (CRESTOR) 20 MG tablet Take 10 mg by mouth daily.  Marland Kitchen spironolactone (ALDACTONE) 25 MG tablet TAKE ONE TABLET BY MOUTH DAILY  . SYNTHROID 88 MCG tablet Take 1 tablet (88 mcg total) by mouth daily.  . valsartan (DIOVAN) 160 MG tablet TAKE ONE TABLET BY MOUTH DAILY     Allergies:   Sulfa antibiotics   Social History   Socioeconomic History  . Marital status: Married    Spouse name: Not on file  . Number of children: Not on file  . Years of education: Not on file  . Highest education level: Not on file  Occupational History  . Not on file  Social Needs  . Financial resource strain: Not on file  . Food insecurity:    Worry: Not on file    Inability: Not on file  . Transportation needs:    Medical: Not on file    Non-medical: Not on file  Tobacco Use  . Smoking status: Former Research scientist (life sciences)  . Smokeless tobacco: Never Used  Substance and Sexual Activity  . Alcohol use: Yes    Comment: wine 2x/wk  . Drug use: No  . Sexual activity: Yes    Birth control/protection: Post-menopausal  Lifestyle  . Physical activity:    Days per  week: Not on file    Minutes per session: Not on file  . Stress: Not on file  Relationships  . Social connections:    Talks on phone: Not on file    Gets together: Not on file    Attends religious service: Not on file    Active member of club or organization: Not on file    Attends meetings of clubs or organizations: Not on file    Relationship status: Not on file  Other Topics Concern  . Not on file  Social History Narrative  . Not on file     Family History:  The patient's family history includes Breast cancer (age of onset: 97) in her sister; Thyroid cancer in her mother; Uterine cancer (age of onset: 37) in her paternal grandmother.  ROS:   Please see the history of present illness. All other systems are reviewed and otherwise negative.    PHYSICAL EXAM:  VS:  BP 140/88   Pulse 94   Ht _0  (1.676 m)   Wt 187 lb 12.8 oz (85.2 kg)   SpO2 96%   BMI 30.31 kg/m   BMI: Body mass index is 30.31 kg/m. GEN: Well nourished, well developed AAF in no acute distress HEENT: normocephalic, atraumatic Neck: no JVD, carotid bruits, or masses Cardiac: RRR; no significant murmurs, rubs, or gallops, no edema, erythema, asymmetry Respiratory:  clear to auscultation bilaterally, normal work of breathing GI: soft, nontender, nondistended, + BS MS: no deformity or atrophy Skin: warm and dry, no rash Neuro:  Alert and Oriented x 3, Strength and sensation are intact, follows commands Psych: euthymic mood, full affect  Wt Readings from Last 3 Encounters:  04/07/18 187 lb 12.8 oz (85.2 kg)  03/28/18 191 lb 12.8 oz (87 kg)  03/14/18 191 lb 12.8 oz (87 kg)      Studies/Labs Reviewed:   EKG:  EKG was not ordered today  Recent Labs: 02/01/2018: ALT 16 02/02/2018: B Natriuretic Peptide 18.8 03/28/2018: TSH 0.745 03/29/2018: BUN 10; Creatinine, Ser 0.88; Hemoglobin 11.2; Platelets 222; Potassium 4.8; Sodium 140   Lipid Panel    Component Value Date/Time   CHOL 206 (H) 01/28/2018  1600   TRIG 346 (H) 01/28/2018 1600   HDL 59 01/28/2018 1600   CHOLHDL 3.5 01/28/2018 1600   LDLCALC 78 01/28/2018 1600    Additional studies/ records that were reviewed today include: Summarized above.   ASSESSMENT & PLAN:   1. Recurrent pericarditis - she will continue a 6 week prednisone taper as outlined above. Anticipate continuing colchicine for 6 months given recurrences this winter, per UpToDate guidelines. We can re-evaluate timing of DC at f/u appointment. Continue PPI. I did ask her to f/u with her PCP to have her blood sugar monitored while on prednisone. 2. Pericardial effusion - monitor clinically. Trivial by repeat echo recently. 3. Sinus tachycardia - suspect related to #1 and treatment for such. Follow on carvedilol. HR is now in the 90s. 4. Essential HTN - increased BP on prednisone, also with occasional palpitations with sinus tach. Start carvedilol 6.67m BID. The patient was instructed to monitor their blood pressure at home and send in readings in 1 week. She will keep uKoreainformed in the meantime of any unusual alarming values.  Disposition: F/u with myself or Brittainy Simmons in 3 months (Dr. NMeda Coffeeis booked out until June).  Medication Adjustments/Labs and Tests Ordered: Current medicines are reviewed at length with the patient today.  Concerns regarding medicines are outlined above. Medication changes, Labs and Tests ordered today are summarized above and listed in the Patient Instructions accessible in Encounters.   Signed, DCharlie Pitter PA-C  04/07/2018 12:02 PM    [facility] [address] [place]  [id number]: [phone] Fax: [phone]

## 2018-04-06 ENCOUNTER — Encounter: Payer: Self-pay | Admitting: Physician Assistant

## 2018-04-07 ENCOUNTER — Encounter: Payer: Self-pay | Admitting: Physician Assistant

## 2018-04-07 ENCOUNTER — Ambulatory Visit (INDEPENDENT_AMBULATORY_CARE_PROVIDER_SITE_OTHER): Payer: Federal, State, Local not specified - PPO | Admitting: Physician Assistant

## 2018-04-07 VITALS — BP 140/88 | HR 94 | Ht 66.0 in | Wt 187.8 lb

## 2018-04-07 DIAGNOSIS — I300 Acute nonspecific idiopathic pericarditis: Secondary | ICD-10-CM

## 2018-04-07 DIAGNOSIS — I10 Essential (primary) hypertension: Secondary | ICD-10-CM | POA: Diagnosis not present

## 2018-04-07 DIAGNOSIS — I313 Pericardial effusion (noninflammatory): Secondary | ICD-10-CM

## 2018-04-07 DIAGNOSIS — I3139 Other pericardial effusion (noninflammatory): Secondary | ICD-10-CM

## 2018-04-07 DIAGNOSIS — R000 Tachycardia, unspecified: Secondary | ICD-10-CM | POA: Diagnosis not present

## 2018-04-07 MED ORDER — COLCHICINE 0.6 MG PO TABS: 0.6000 mg | ORAL_TABLET | Freq: Two times a day (BID) | ORAL | 2 refills | Status: DC

## 2018-04-07 MED ORDER — CARVEDILOL 6.25 MG PO TABS: 6.2500 mg | ORAL_TABLET | Freq: Two times a day (BID) | ORAL | 3 refills | Status: DC

## 2018-04-07 NOTE — Patient Instructions (Addendum)
Medication Instructions:  Your physician has recommended you make the following change in your medication:  1.  START Carvedilol 6.25 mg taking 1 tablet twice a day  If you need a refill on your cardiac medications before your next appointment, please call your pharmacy.   Lab work: None ordered If you have labs (blood work) drawn today and your tests are completely normal, you will receive your results only by: Marland Kitchen MyChart Message (if you have MyChart) OR . A paper copy in the mail If you have any lab test that is abnormal or we need to change your treatment, we will call you to review the results.  Testing/Procedures: None ordered  Follow-Up: Your physician recommends that you schedule a follow-up appointment in: [place], PA-C   Any Other Special Instructions Will Be Listed Below (If Applicable). Check your blood pressure daily and send in your readings in a week through [employer]

## 2018-04-13 ENCOUNTER — Ambulatory Visit: Payer: Federal, State, Local not specified - PPO | Admitting: Internal Medicine

## 2018-04-13 ENCOUNTER — Encounter: Payer: Self-pay | Admitting: Internal Medicine

## 2018-04-13 VITALS — BP 134/86 | HR 85 | Temp 98.1°F | Ht 65.0 in | Wt 190.4 lb

## 2018-04-13 DIAGNOSIS — R7309 Other abnormal glucose: Secondary | ICD-10-CM | POA: Diagnosis not present

## 2018-04-13 DIAGNOSIS — Z09 Encounter for follow-up examination after completed treatment for conditions other than malignant neoplasm: Secondary | ICD-10-CM

## 2018-04-13 DIAGNOSIS — I10 Essential (primary) hypertension: Secondary | ICD-10-CM

## 2018-04-13 DIAGNOSIS — I300 Acute nonspecific idiopathic pericarditis: Secondary | ICD-10-CM | POA: Diagnosis not present

## 2018-04-13 DIAGNOSIS — E6609 Other obesity due to excess calories: Secondary | ICD-10-CM

## 2018-04-13 DIAGNOSIS — Z6831 Body mass index (BMI) 31.0-31.9, adult: Secondary | ICD-10-CM

## 2018-04-13 NOTE — Patient Instructions (Signed)

## 2018-04-14 ENCOUNTER — Ambulatory Visit: Payer: Federal, State, Local not specified - PPO | Admitting: Physician Assistant

## 2018-04-14 LAB — GLUCOSE, RANDOM: Glucose: 147 mg/dL — ABNORMAL HIGH (ref 65–99)

## 2018-04-14 LAB — HEMOGLOBIN A1C
Est. average glucose Bld gHb Est-mCnc: 126 mg/dL
Hgb A1c MFr Bld: 6 % — ABNORMAL HIGH (ref 4.8–5.6)

## 2018-04-16 ENCOUNTER — Other Ambulatory Visit: Payer: Self-pay | Admitting: Internal Medicine

## 2018-04-19 ENCOUNTER — Other Ambulatory Visit: Payer: Self-pay | Admitting: Internal Medicine

## 2018-04-19 MED ORDER — VALSARTAN 160 MG PO TABS: 160.0000 mg | ORAL_TABLET | Freq: Every day | ORAL | 0 refills | Status: DC

## 2018-04-19 MED ORDER — SPIRONOLACTONE 25 MG PO TABS: 25.0000 mg | ORAL_TABLET | Freq: Every day | ORAL | 0 refills | Status: DC

## 2018-04-27 MED ORDER — CARVEDILOL 12.5 MG PO TABS: 12.5000 mg | ORAL_TABLET | Freq: Two times a day (BID) | ORAL | 3 refills | Status: DC

## 2018-04-27 NOTE — Telephone Encounter (Signed)
Please thank pt for sending in readings! Let her know I was out of the office and Gae Bon deferred non emergent messages for my review. If BP have since continued to run >130, OK to increase carvedilol to 12.5mg  BID. Continue to follow BP and let us known if consistently running >734 systolic or >03 diastolic. Saphyra Hutt PA-C

## 2018-04-27 NOTE — Telephone Encounter (Signed)
Placed call to pt re: blood pressure. Pt states it has still continued to run >514 systolic. Pt has been made advise to increase the Carvedilol to 12.5 bid and continue to monitor her bp and bring in records with her at her f/u appt with Melina Copa, PA-C 05/05/2018. Pt verbalized understanding and thanked me for the call.

## 2018-04-29 NOTE — Progress Notes (Signed)
Cardiology Office Note    Date:  05/03/2018  ID:  Stephanie Dudley, DOB [birth year], MRN [id number] PCP:  Glendale Chard, MD  Cardiologist:  Ena Dawley, MD   Chief Complaint: f/u pericarditis  History of Present Illness:  Stephanie Dudley is a 63 y.o. female with history of HTN, HLD (followed by PCP), MVP, hypothyroidism,breast CAs/p ofright lumpectomy with seed,recent recurrentpericarditis with pericardial effusion who presents to f/u pericarditis.  To recap, she wasadmitted11/2019for idiopathic pericarditiswithelevated CRP/ESR and diffuse ST elevation on EKG.2D echo showed EF 65-70%, otherwise normal without pericardial effusion. She saw Lyda Jester 03/01/18 for follow up.She had completed 2-week course of ibuprofen. Symptoms improved but never resolved so she was started onindomethacin 25 mg TID for the next 2 weeks, followed by a taper, with recomendation to otherwise continue colchcine x 3 months. However, shepresented back to the hospital 03/09/18 with right sided chest pain worsewith any position, coughing, yawningand deep breathing. D-dimer was elevated.CT angiogram of the chest was negative for PE but did show vascular congestion and small pericardial effusion. She was alsomildly tachycardic. Sed rate was further elevated and she had borderline rheumatoid factor. ANA was negative.TSH was borderline low but free T4 was normal.She was treated with toradol and prednisone. She was sent home with steroid taper over the course of 15 days. I saw her in follow-up 03/14/18 and she was doing very well. We reduced her statin while on colchicine given the drug interaction. Her ESR and CRP were still elevated but coming down. She was referred to rheumatology to evaluate the abnormal RF and recurrent pericarditis but rheumatology reviewed her chart and declined the referral. She was readmitted 1/6-03/29/2018 with recurrent pleuritic chest pain, sinus tach, and elevated  CRP/ESR (although lower than prior admission). She had an episode of tachypalpitations while on telemetry and this showed sinus tachycardia with HR 150s - was not afib/flutter. She was felt to have recurrent pericarditis and longer steroid taper was recommended over 6 weeks' time. Last labs 03/2018 showed Hgb 11.2, Plt 222, TSH wnl, K 4.8, Cr 0.88, ESR 30, CRP 3.7. 2D echo 03/29/18 showed EF 60-65%, possible trivial free flowing pericardial effusion along RV free wall and apex. At f/u OV in 03/2018, she was feeling better. We started carvedilol for HTN, likely driven somewhat by the steroids.  She returns for follow-up and states she is feeling great overall but has noticed some dyspnea with higher levels of exertion. This has been gradual in onset over 3 weeks' time. She did not feel this was severe enough to notify us earlier than today's visit. She typically tries to park far away and is able to walk over flat surfaces and do ADLs just fine without any functional limitation, but notices if she goes up stairs quickly now she gets winded. Sometimes she gets SOB after taking a shower. She has mostly attributed this to central abdominal weight gain which she feels is related to dietary indiscretion while on the prednisone. She states if she wears a girdle to hold in the excess weight her dyspnea is not as prominent. No further chest pain. No orthopnea or edema. She has noticed some wheezing but this is not present today. We walked her in clinic today briskly and she did get SOB with brisk walk but pulse ox remained WNL and she did fine with normal ambulation.   Past Medical History:  Diagnosis Date  . Arthritis   . Breast mass, right 08/2017   Benign tissue  . Complication of  anesthesia    hard to wake up with thyroid surgery  . Family history of breast cancer   . Family history of thyroid cancer   . Family history of uterine cancer   . Hyperlipidemia   . Hypertension   . Hypothyroidism   . Mitral valve  prolapse    a. not seen on echo 01/2018.  Marland Kitchen Pericardial effusion   . Pericarditis   . Peripheral edema    ankles, feet    Past Surgical History:  Procedure Laterality Date  . APPENDECTOMY  1994  . BREAST BIOPSY Left 1999  . BREAST LUMPECTOMY WITH RADIOACTIVE SEED LOCALIZATION Right 09/28/2017   Procedure: RIGHT BREAST LUMPECTOMY WITH RADIOACTIVE SEED LOCALIZATION;  Surgeon: Erroll Luna, MD;  Location: [facility];  Service: General;  Laterality: Right;  . BREAST SURGERY    . KNEE ARTHROSCOPY Left   . MIDDLE EAR SURGERY Left 2011  . THYROIDECTOMY  1974   goiter--total thyroidectomy  . TONSILLECTOMY  1972  . WRIST FRACTURE SURGERY      Current Medications: Current Meds  Medication Sig  . carvedilol (COREG) 12.5 MG tablet Take 1 tablet (12.5 mg total) by mouth 2 (two) times daily.  . colchicine 0.6 MG tablet Take 1 tablet (0.6 mg total) by mouth 2 (two) times daily for 30 days.  . furosemide (LASIX) 40 MG tablet Take 40 mg by mouth daily.   . predniSONE (DELTASONE) 10 MG tablet Take 4 tablets (40 mg total) by mouth daily with breakfast for 7 days, THEN 3 tablets (30 mg total) daily with breakfast for 7 days, THEN 2 tablets (20 mg total) daily with breakfast for 7 days, THEN 1 tablet (10 mg total) daily with breakfast for 7 days, THEN 0.5 tablets (5 mg total) daily with breakfast for 14 days.  . rosuvastatin (CRESTOR) 20 MG tablet Take 10 mg by mouth daily.  Marland Kitchen spironolactone (ALDACTONE) 25 MG tablet TAKE ONE TABLET BY MOUTH DAILY  . spironolactone (ALDACTONE) 25 MG tablet Take 1 tablet (25 mg total) by mouth daily.  Marland Kitchen SYNTHROID 88 MCG tablet Take 1 tablet (88 mcg total) by mouth daily.  . valsartan (DIOVAN) 160 MG tablet Take 1 tablet (160 mg total) by mouth daily.      Allergies:   Sulfa antibiotics   Social History   Socioeconomic History  . Marital status: Married    Spouse name: Not on file  . Number of children: Not on file  . Years of education: Not on  file  . Highest education level: Not on file  Occupational History  . Not on file  Social Needs  . Financial resource strain: Not on file  . Food insecurity:    Worry: Not on file    Inability: Not on file  . Transportation needs:    Medical: Not on file    Non-medical: Not on file  Tobacco Use  . Smoking status: Former Research scientist (life sciences)  . Smokeless tobacco: Never Used  Substance and Sexual Activity  . Alcohol use: Yes    Comment: wine 2x/wk  . Drug use: No  . Sexual activity: Yes    Birth control/protection: Post-menopausal  Lifestyle  . Physical activity:    Days per week: Not on file    Minutes per session: Not on file  . Stress: Not on file  Relationships  . Social connections:    Talks on phone: Not on file    Gets together: Not on file    Attends religious  service: Not on file    Active member of club or organization: Not on file    Attends meetings of clubs or organizations: Not on file    Relationship status: Not on file  Other Topics Concern  . Not on file  Social History Narrative  . Not on file     Family History:  The patient's family history includes Breast cancer (age of onset: 72) in her sister; Thyroid cancer in her mother; Uterine cancer (age of onset: 74) in her paternal grandmother.  ROS:   Please see the history of present illness.   All other systems are reviewed and otherwise negative.    PHYSICAL EXAM:   VS:  BP 122/74   Pulse 80   Ht '5\' 6"'$  (1.676 m)   Wt 195 lb 12.8 oz (88.8 kg)   SpO2 96%   BMI 31.60 kg/m   BMI: Body mass index is 31.6 kg/m. GEN: Well nourished, well developed AAF, in no acute distress HEENT: normocephalic, atraumatic Neck: no JVD, carotid bruits, or masses Cardiac: RRR; no murmurs, rubs, or gallops, no edema  Respiratory:  clear to auscultation bilaterally, normal work of breathing GI: soft, nontender, nondistended, + BS MS: no deformity or atrophy Skin: warm and dry, no rash Neuro:  Alert and Oriented x 3, Strength and  sensation are intact, follows commands Psych: euthymic mood, full affect  Wt Readings from Last 3 Encounters:  05/03/18 195 lb 12.8 oz (88.8 kg)  04/13/18 190 lb 6.4 oz (86.4 kg)  04/07/18 187 lb 12.8 oz (85.2 kg)      Studies/Labs Reviewed:   EKG:  EKG was ordered today and personally reviewed by me and demonstrates NSR 79bpm, nonspecific ST-Chnages, no acute changes. No ST elevation or TWI.  Recent Labs: 02/01/2018: ALT 16 02/02/2018: B Natriuretic Peptide 18.8 03/28/2018: TSH 0.745 03/29/2018: BUN 10; Creatinine, Ser 0.88; Hemoglobin 11.2; Platelets 222; Potassium 4.8; Sodium 140   Lipid Panel    Component Value Date/Time   CHOL 206 (H) 01/28/2018 1600   TRIG 346 (H) 01/28/2018 1600   HDL 59 01/28/2018 1600   CHOLHDL 3.5 01/28/2018 1600   LDLCALC 78 01/28/2018 1600    Additional studies/ records that were reviewed today include: Summarized above   ASSESSMENT & PLAN:   1. Exertional dyspnea - the patient feels strongly that this is related to weight gain in her midsection and lack of activity. She has not exercised since October given her issues with pericarditis this fall. She is not tachycardic, tachypneic or hypoxic and has no signs of DVT on exam. She has no pleuritic chest pain. EKG is nonacute. She is not having any angina. I will check labs including BNP and CBC to exclude any acute cause for her dyspnea, as well as 2V CXR and limited echo to exclude any effusions. If workup unrevealing will encourage her to engage back in regular physical activity to combat deconditioning and weight gain. We discussed that she should let me know ASAP if symptoms worsen or do not improve. We could consider changing carvedilol to more selective agent in the future if needed. 2. Recurrent pericarditis - clinically regressed. She is on the last dosage arm (1/2 tablet) of her prednisone taper with about 8 days to go. Check CRP/ESR with labs to trend now that CP has resolved. Continue colchicine  6 months from last recurrence (03/2018) - so should continue through 09/2018. Re-eval timing of cessation at next OV. 3. Pericardial effusion - re-eval by echo. 4.  Sinus tachycardia - resolved on carvedilol. 5. Essential HTN - controlled with titration of carvedilol.  Disposition: F/u with me in 3 months on a day when Dr. Meda Coffee in 3 months.  Medication Adjustments/Labs and Tests Ordered: Current medicines are reviewed at length with the patient today.  Concerns regarding medicines are outlined above. Medication changes, Labs and Tests ordered today are summarized above and listed in the Patient Instructions accessible in Encounters.   Signed, Charlie Pitter, PA-C  05/03/2018 10:28 AM    [facility] [address], [place], [place]  [address] Phone: [phone]; Fax: [phone]

## 2018-05-03 ENCOUNTER — Ambulatory Visit
Admission: RE | Admit: 2018-05-03 | Discharge: 2018-05-03 | Disposition: A | Discharge status: Home / Self Care | Payer: Federal, State, Local not specified - PPO | Source: Ambulatory Visit | Attending: Physician Assistant | Admitting: Physician Assistant

## 2018-05-03 ENCOUNTER — Encounter: Payer: Self-pay | Admitting: Physician Assistant

## 2018-05-03 ENCOUNTER — Telehealth: Payer: Self-pay | Admitting: *Deleted

## 2018-05-03 ENCOUNTER — Ambulatory Visit: Payer: Federal, State, Local not specified - PPO | Admitting: Physician Assistant

## 2018-05-03 ENCOUNTER — Other Ambulatory Visit (HOSPITAL_COMMUNITY): Payer: Federal, State, Local not specified - PPO

## 2018-05-03 VITALS — BP 122/74 | HR 80 | Ht 66.0 in | Wt 195.8 lb

## 2018-05-03 DIAGNOSIS — I300 Acute nonspecific idiopathic pericarditis: Secondary | ICD-10-CM | POA: Diagnosis not present

## 2018-05-03 DIAGNOSIS — R0609 Other forms of dyspnea: Secondary | ICD-10-CM | POA: Diagnosis not present

## 2018-05-03 DIAGNOSIS — R0600 Dyspnea, unspecified: Secondary | ICD-10-CM

## 2018-05-03 DIAGNOSIS — R000 Tachycardia, unspecified: Secondary | ICD-10-CM | POA: Diagnosis not present

## 2018-05-03 DIAGNOSIS — I3139 Other pericardial effusion (noninflammatory): Secondary | ICD-10-CM

## 2018-05-03 DIAGNOSIS — I313 Pericardial effusion (noninflammatory): Secondary | ICD-10-CM

## 2018-05-03 DIAGNOSIS — I10 Essential (primary) hypertension: Secondary | ICD-10-CM

## 2018-05-03 NOTE — Patient Instructions (Addendum)
Medication Instructions:  Your physician recommends that you continue on your current medications as directed. Please refer to the Current Medication list given to you today.  If you need a refill on your cardiac medications before your next appointment, please call your pharmacy.   Lab work: TODAY:  BMET, CBC, PRO BNP, ESR, & CRP  If you have labs (blood work) drawn today and your tests are completely normal, you will receive your results only by: Marland Kitchen MyChart Message (if you have MyChart) OR . A paper copy in the mail If you have any lab test that is abnormal or we need to change your treatment, we will call you to review the results.  Testing/Procedures: Your physician has requested that you have an limited echocardiogram ASAP = TOMORROW, 05/04/2018, ARRIVE AT 7:15 A.M. FOR THIS.   Echocardiography is a painless test that uses sound waves to create images of your heart. It provides your doctor with information about the size and shape of your heart and how well your heart's chambers and valves are working. This procedure takes approximately one hour. There are no restrictions for this procedure.   A chest x-ray takes a picture of the organs and structures inside the chest, including the heart, lungs, and blood vessels. This test can show several things, including, whether the heart is enlarges; whether fluid is building up in the lungs; and whether pacemaker / defibrillator leads are still in place. [place], [place] You can walk in [place] and get this done   Follow-Up: Your physician recommends that you schedule a follow-up appointment in: [place], PA-C ON A DAY DR. Meda Coffee IS IN THE OFFICE    Any Other Special Instructions Will Be Listed Below (If Applicable).  Echocardiogram An echocardiogram is a procedure that uses painless sound waves (ultrasound) to produce an image of the heart. Images from an  echocardiogram can provide important information about:  Signs of coronary artery disease (CAD).  Aneurysm detection. An aneurysm is a weak or damaged part of an artery wall that bulges out from the normal force of blood pumping through the body.  Heart size and shape. Changes in the size or shape of the heart can be associated with certain conditions, including heart failure, aneurysm, and CAD.  Heart muscle function.  Heart valve function.  Signs of a past heart attack.  Fluid buildup around the heart.  Thickening of the heart muscle.  A tumor or infectious growth around the heart valves. Tell a health care provider about:  Any allergies you have.  All medicines you are taking, including vitamins, herbs, eye drops, creams, and over-the-counter medicines.  Any blood disorders you have.  Any surgeries you have had.  Any medical conditions you have.  Whether you are pregnant or may be pregnant. What are the risks? Generally, this is a safe procedure. However, problems may occur, including:  Allergic reaction to dye (contrast) that may be used during the procedure. What happens before the procedure? No specific preparation is needed. You may eat and drink normally. What happens during the procedure?   An IV tube may be inserted into one of your veins.  You may receive contrast through this tube. A contrast is an injection that improves the quality of the pictures from your heart.  A gel will be applied to your chest.  A wand-like tool (transducer) will be moved over your chest. The gel will help to  transmit the sound waves from the transducer.  The sound waves will harmlessly bounce off of your heart to allow the heart images to be captured in real-time motion. The images will be recorded on a computer. The procedure may vary among health care providers and hospitals. What happens after the procedure?  You may return to your normal, everyday life, including diet,  activities, and medicines, unless your health care provider tells you not to do that. Summary  An echocardiogram is a procedure that uses painless sound waves (ultrasound) to produce an image of the heart.  Images from an echocardiogram can provide important information about the size and shape of your heart, heart muscle function, heart valve function, and fluid buildup around your heart.  You do not need to do anything to prepare before this procedure. You may eat and drink normally.  After the echocardiogram is completed, you may return to your normal, everyday life, unless your health care provider tells you not to do that. This information is not intended to replace advice given to you by your health care provider. Make sure you discuss any questions you have with your health care provider. Document Released: 03/06/2000 Document Revised: 04/11/2016 Document Reviewed: 04/11/2016 Elsevier Interactive Patient Education  2019 [employer].   Chest X-Ray  A chest X-ray is a painless test that uses radiation to create images of the structures inside of your chest. Chest X-rays are used to look for many health conditions, including heart failure, pneumonia, tuberculosis, rib fractures, breathing disorders, and cancer. They may be used to diagnose chest pain, constant coughing, or trouble breathing. Tell a health care provider about:  Any allergies you have.  All medicines you are taking, including vitamins, herbs, eye drops, creams, and over-the-counter medicines.  Any surgeries you have had.  Any medical conditions you have.  Whether you are pregnant or may be pregnant. What are the risks? Getting a chest X-ray is a safe procedure. However, you will be exposed to a small amount of radiation. Being exposed to too much radiation over a lifetime can increase the risk of cancer. This risk is small, but it may occur if you have many X-rays throughout your life. What happens before the  procedure?  You may be asked to remove glasses, jewelry, and any other metal objects.  You will be asked to undress from the waist up. You may be given a hospital gown to wear.  You may be asked to wear a protective lead apron to protect parts of your body from radiation. What happens during the procedure?  You will be asked to stand still as each picture is taken to get the best possible images.  You will be asked to take a deep breath and hold your breath for a few seconds.  The X-ray machine will create a picture of your chest using a tiny burst of radiation. This is painless.  More pictures may be taken from other angles. Typically, one picture will be taken while you face the X-ray camera, and another picture will be taken from the side while you stand. If you cannot stand, you may be asked to lie down. The procedure may vary among health care providers and hospitals. What happens after the procedure?  The X-ray(s) will be reviewed by your health care provider or an X-ray (radiology) specialist.  It is up to you to get your test results. Ask your health care provider, or the department that is doing the test, when your results  will be ready.  Your health care provider will tell you if you need more tests or a follow-up exam. Keep all follow-up visits as told by your health care provider. This is important. Summary  A chest X-ray is a safe, painless test that is used to examine the inside of the chest, heart, and lungs.  You will need to undress from the waist up and remove jewelry and metal objects before the procedure.  You will be exposed to a small amount of radiation during the procedure.  The X-ray machine will take one or more pictures of your chest while you remain as still as possible.  Later, a health care provider or specialist will review the test results with you. This information is not intended to replace advice given to you by your health care provider. Make  sure you discuss any questions you have with your health care provider. Document Released: 05/05/2016 Document Revised: 05/05/2016 Document Reviewed: 05/05/2016 Elsevier Interactive Patient Education  [employer].

## 2018-05-03 NOTE — Telephone Encounter (Signed)
Called pt re: chest xray results, left a message for pt to call back.

## 2018-05-03 NOTE — Telephone Encounter (Signed)
-----   Message from Charlie Pitter, [place] sent at 05/03/2018  4:17 PM EST ----- Please let patient know CXR shows interval clearing of lungs - great news. Await labs and echo [facility]

## 2018-05-04 ENCOUNTER — Ambulatory Visit (HOSPITAL_COMMUNITY): Payer: Federal, State, Local not specified - PPO | Attending: Cardiology

## 2018-05-04 DIAGNOSIS — R0609 Other forms of dyspnea: Secondary | ICD-10-CM | POA: Diagnosis not present

## 2018-05-04 DIAGNOSIS — R0600 Dyspnea, unspecified: Secondary | ICD-10-CM

## 2018-05-04 NOTE — Telephone Encounter (Signed)
Called pt re: Echo, Labs, & Chest Xray results. Left another message for pt to call back.

## 2018-05-04 NOTE — Telephone Encounter (Signed)
-----   Message from Charlie Pitter, [place] sent at 05/04/2018  1:10 PM EST ----- Please let Ms. Cuff know her echo looks GREAT, thank goodness. No evidence of any fluid at all. Heart function is normal and the right side of her heart is totally normal as well, arguing against an acute lung process. There are mild findings such as impaired relaxation (common in h/o HTN) and mild calcification of the mitral and aortic valve but no significant valve dysfunction, only mild mitral valve prolapse which we will follow clinically. I want her to make sure she's paying attention to calories with her weight gain and try to gradually increase activity in the weeks to come. If SOB persists or worsens, let us know. See lab note as well when it comes through. Dayna Dunn PA-C

## 2018-05-05 LAB — BASIC METABOLIC PANEL
BUN/Creatinine Ratio: 10 — ABNORMAL LOW (ref 12–28)
BUN: 9 mg/dL (ref 8–27)
CO2: 24 mmol/L (ref 20–29)
Calcium: 9.3 mg/dL (ref 8.7–10.3)
Chloride: 100 mmol/L (ref 96–106)
Creatinine, Ser: 0.87 mg/dL (ref 0.57–1.00)
GFR calc Af Amer: 83 mL/min/{1.73_m2} (ref >59)
GFR calc non Af Amer: 72 mL/min/{1.73_m2} (ref >59)
Glucose: 86 mg/dL (ref 65–99)
Potassium: 4.1 mmol/L (ref 3.5–5.2)
Sodium: 140 mmol/L (ref 134–144)

## 2018-05-05 LAB — PRO B NATRIURETIC PEPTIDE: NT-Pro BNP: 47 pg/mL (ref 0–287)

## 2018-05-05 LAB — CBC
Hematocrit: 35.3 % (ref 34.0–46.6)
Hemoglobin: 11.2 g/dL (ref 11.1–15.9)
MCH: 27.5 pg (ref 26.6–33.0)
MCHC: 31.7 g/dL (ref 31.5–35.7)
MCV: 87 fL (ref 79–97)
Platelets: 292 10*3/uL (ref 150–450)
RBC: 4.08 x10E6/uL (ref 3.77–5.28)
RDW: 14.7 % (ref 11.7–15.4)
WBC: 8 10*3/uL (ref 3.4–10.8)

## 2018-05-05 LAB — SEDIMENTATION RATE: Sed Rate: 29 mm/hr (ref 0–40)

## 2018-05-05 LAB — C-REACTIVE PROTEIN: CRP: 6 mg/L (ref 0–10)

## 2018-05-09 NOTE — Telephone Encounter (Signed)
Tried to reach pt re: echo, lab results, left another message for pt to call back.

## 2018-05-12 ENCOUNTER — Other Ambulatory Visit: Payer: Self-pay | Admitting: Internal Medicine

## 2018-05-12 ENCOUNTER — Encounter: Payer: Self-pay | Admitting: *Deleted

## 2018-05-13 NOTE — Telephone Encounter (Signed)
I have been unable to reach this patient by phone.  A letter is being sent to the last known home address. jw 05/12/2018

## 2018-05-17 NOTE — Telephone Encounter (Signed)
Pt called asking for someone to go over results from her procedure. She apologizes because her land line was having issues, but it has since been repaired. She states it is best to call her on her mobile number, which has been noted in the chart.  Pt also wants to know if it is safe for her to start exercising again

## 2018-05-18 ENCOUNTER — Encounter: Payer: Self-pay | Admitting: Internal Medicine

## 2018-05-18 NOTE — Progress Notes (Signed)
Subjective:     Patient ID: Stephanie Dudley , female    DOB: 04/24/1955 , 63 y.o.   MRN: [id number]   Chief Complaint  Patient presents with  . Hospitalization Follow-up    HPI  She is here today for hospital f/u. TOC care visit was performed by Cardiology on 1/16. She was recently admitted to [facility] on 1/6 and discharged on 03/29/18.  A brief synopsis  - she wasadmitted11/2019for idiopathic pericarditiswithelevated CRP/ESR, and diffuse ST elevation on EKG.2D echo showed EF 65-70%, otherwise normal without pericardial effusion. She had completed 2-week course of ibuprofen without symptom improvement. Indomethacin was then started and she was advised to continue colchicine x 3 months. Shepresented back to the hospital 03/09/18 with right sided chest pain worsewith any position, coughing, yawningand deep breathing. D-dimer was also elevated.CT angiogram of the chest was negative for PE but did show vascular congestion and small pericardial effusion. She was alsomildly tachycardic. Sed rate was further elevated and she had borderline rheumatoid factor. ANA was negative.TSH was borderline low but free T4 was normal.She was treated with toradol and prednisone. She was sent home with steroid taper over the course of 15 days.  She was referred to rheumatology to evaluate the abnormal RF and recurrent pericarditis but Rheumatology declined the referral.  Unfortunately, on Friday evening 03/25/18 she noticed a vague recurrence of the same chest pain, this time across her whole chest. By Saturday it was constant. She would take ibuprofen and would feel great for about 3  hours, then it would recur requiring q6hr dosing. She had trouble sleeping because the pain was worse lying back or on her side. It was also worse with inspiration, coughing, and exertion. She also noticed increased SOB. She called into the Cardiology office with these symptoms on 1/6 and was advised to go to ER. She was again mildly  tachycardic, with normal BP and pulse ox. Labs showed normal troponin and WBC, with mild stable anemia with Hgb 11.0 and normal renal function. CXR showed mild bibasilar atelectasis but lungs otherwise clear, normal pericardial silhouette. She received '15mg'$  of toradol and '4mg'$  of morphine with improvement in symptoms but could still feel pain on inspiration. She was admitted for further eval and pain management.  She was discharged on 1/7 in stable condition. She reports her sx have slowly improved over the past several weeks.     Past Medical History:  Diagnosis Date  . Arthritis   . Breast mass, right 08/2017   Benign tissue  . Complication of anesthesia    hard to wake up with thyroid surgery  . Family history of breast cancer   . Family history of thyroid cancer   . Family history of uterine cancer   . Hyperlipidemia   . Hypertension   . Hypothyroidism   . Mitral valve prolapse    a. not seen on echo 01/2018.  Marland Kitchen Pericardial effusion   . Pericarditis   . Peripheral edema    ankles, feet     Family History  Problem Relation Age of Onset  . Breast cancer Sister 57  . Thyroid cancer Mother   . Uterine cancer Paternal Grandmother 45     Current Outpatient Medications:  .  colchicine 0.6 MG tablet, Take 1 tablet (0.6 mg total) by mouth 2 (two) times daily for 30 days., Disp: 60 tablet, Rfl: 2 .  furosemide (LASIX) 40 MG tablet, Take 40 mg by mouth daily. , Disp: , Rfl:  .  pantoprazole (  PROTONIX) 40 MG tablet, Take 1 tablet (40 mg total) by mouth daily before breakfast., Disp: 90 tablet, Rfl: 3 .  SYNTHROID 88 MCG tablet, Take 1 tablet (88 mcg total) by mouth daily., Disp: 30 tablet, Rfl: 2 .  carvedilol (COREG) 12.5 MG tablet, Take 1 tablet (12.5 mg total) by mouth 2 (two) times daily., Disp: 180 tablet, Rfl: 3 .  rosuvastatin (CRESTOR) 20 MG tablet, TAKE ONE TABLET BY MOUTH DAILY, Disp: 90 tablet, Rfl: 2 .  spironolactone (ALDACTONE) 25 MG tablet, TAKE ONE TABLET BY MOUTH DAILY,  Disp: 90 tablet, Rfl: 0 .  spironolactone (ALDACTONE) 25 MG tablet, Take 1 tablet (25 mg total) by mouth daily., Disp: 90 tablet, Rfl: 0 .  valsartan (DIOVAN) 160 MG tablet, Take 1 tablet (160 mg total) by mouth daily., Disp: 90 tablet, Rfl: 0   Allergies  Allergen Reactions  . Sulfa Antibiotics Swelling     Review of Systems  Constitutional: Negative.   Respiratory: Negative.   Cardiovascular: Negative.   Gastrointestinal: Negative.   Neurological: Negative.   Psychiatric/Behavioral: Negative.      Today's Vitals   04/13/18 1427  BP: 134/86  Pulse: 85  Temp: 98.1 F (36.7 C)  TempSrc: Oral  Weight: 190 lb 6.4 oz (86.4 kg)  Height: '5\' 5"'$  (1.651 m)   Body mass index is 31.68 kg/m.   Objective:  Physical Exam Vitals signs and nursing note reviewed.  Constitutional:      Appearance: Normal appearance.  HENT:     Head: Normocephalic and atraumatic.  Cardiovascular:     Rate and Rhythm: Normal rate and regular rhythm.     Heart sounds: Normal heart sounds.  Pulmonary:     Effort: Pulmonary effort is normal.     Breath sounds: Normal breath sounds.  Skin:    General: Skin is warm.  Neurological:     General: No focal deficit present.     Mental Status: She is alert.  Psychiatric:        Mood and Affect: Mood normal.        Behavior: Behavior normal.         Assessment And Plan:     1. Essential hypertension  Fair control. She is aware optimal bp is less than 130/80.  2. Recurrent idiopathic pericarditis  Chronic. As per Cardiology. Last two discharge summaries were reviewed in full detail.   3. Other abnormal glucose  HER A1C HAS BEEN ELEVATED IN THE PAST. I WILL CHECK AN A1C, BMET TODAY. SHE WAS ENCOURAGED TO AVOID SUGARY BEVERAGES AND PROCESSED FOODS INCLUDNG BREADS, RICE AND PASTA.  - Hemoglobin A1c - Glucose Serum  4. Class 1 obesity due to excess calories with serious comorbidity and body mass index (BMI) of 31.0 to 31.9 in adult  Importance  of achieving optimal weight to decrease risk of cardiovascular disease and cancers was discussed with the patient in full detail. She is encouraged to start slowly - start with 10 minutes twice daily at least three to four days per week and to gradually build to 30 minutes five days weekly. She was given tips to incorporate more activity into her daily routine - take stairs when possible, park farther away from her job, grocery stores, etc.    Maximino Greenland, MD

## 2018-06-13 NOTE — Telephone Encounter (Signed)
Called pt re: my chart message re: ankles swelling. Pt doesn't have a scale, so don't have her weight Her bp has been running low, but couldn't give me recent readings, couldn't access them. Pt states she hasn't been eating out and has not had any excess salt intake Pt does state that she has been standing on her feet more than usual here lately and that she has been taking over the counter sinus day / night time medications.  Pt states she can wear her shoes, but are a little tighter than normal.

## 2018-06-13 NOTE — Telephone Encounter (Signed)
Called pt back.  She stated that she had already reduced her Carvedilol to 1 tablet daily instead bid. Pt states she will monitor her bp for a couple of days and report her bp readings. She will take extra 1/2 tablet Lasix X's 2 days and let us know if the swelling is not improved.

## 2018-06-13 NOTE — Telephone Encounter (Signed)
Please get more information on severity, BP, weight, other symptoms, recent salt intake and other recent med changes. Dayna Dunn PA-C

## 2018-06-13 NOTE — Telephone Encounter (Signed)
If she means BP has been running low as in abnormally low, can cut valsartan in half for now. Needs to check BP and let us know what the number is so help guide further decision making.  If she means BP has been running low as in normal, continue current regimen and can take extra 1/2 tablet of Lasix for 2 days. Elevate legs when possible and use compression hose. Call if no improvement.  Golden Emile PA-C

## 2018-07-04 ENCOUNTER — Other Ambulatory Visit: Payer: Self-pay | Admitting: Internal Medicine

## 2018-07-26 ENCOUNTER — Encounter: Payer: Self-pay | Admitting: Internal Medicine

## 2018-07-27 ENCOUNTER — Ambulatory Visit: Payer: Federal, State, Local not specified - PPO | Admitting: Internal Medicine

## 2018-07-28 ENCOUNTER — Encounter: Payer: Self-pay | Admitting: Internal Medicine

## 2018-07-28 ENCOUNTER — Other Ambulatory Visit: Payer: Self-pay

## 2018-07-28 ENCOUNTER — Ambulatory Visit: Payer: Federal, State, Local not specified - PPO | Admitting: Internal Medicine

## 2018-07-28 ENCOUNTER — Ambulatory Visit (INDEPENDENT_AMBULATORY_CARE_PROVIDER_SITE_OTHER): Payer: Federal, State, Local not specified - PPO | Admitting: Internal Medicine

## 2018-07-28 VITALS — BP 160/72 | HR 98 | Ht 66.0 in | Wt 199.0 lb

## 2018-07-28 DIAGNOSIS — E6609 Other obesity due to excess calories: Secondary | ICD-10-CM | POA: Diagnosis not present

## 2018-07-28 DIAGNOSIS — I300 Acute nonspecific idiopathic pericarditis: Secondary | ICD-10-CM | POA: Diagnosis not present

## 2018-07-28 DIAGNOSIS — I10 Essential (primary) hypertension: Secondary | ICD-10-CM

## 2018-07-28 DIAGNOSIS — R635 Abnormal weight gain: Secondary | ICD-10-CM

## 2018-07-28 DIAGNOSIS — Z6832 Body mass index (BMI) 32.0-32.9, adult: Secondary | ICD-10-CM

## 2018-07-28 NOTE — Patient Instructions (Signed)

## 2018-07-31 NOTE — Progress Notes (Signed)
Virtual Visit via Video   This visit type was conducted due to national recommendations for restrictions regarding the COVID-19 Pandemic (e.g. social distancing) in an effort to limit this patient's exposure and mitigate transmission in our community.  Due to her co-morbid illnesses, this patient is at least at moderate risk for complications without adequate follow up.  This format is felt to be most appropriate for this patient at this time.  All issues noted in this document were discussed and addressed.  A limited physical exam was performed with this format.    This visit type was conducted due to national recommendations for restrictions regarding the COVID-19 Pandemic (e.g. social distancing) in an effort to limit this patient's exposure and mitigate transmission in our community.  Patients identity confirmed using two different identifiers.  This format is felt to be most appropriate for this patient at this time.  All issues noted in this document were discussed and addressed.  No physical exam was performed (except for noted visual exam findings with Video Visits).    Date:  07/31/2018   ID:  Stephanie Dudley, DOB December 31, 1955, MRN [id number]  Patient Location:  Home  Provider location:   Office    Chief Complaint:  Htn f/u  History of Present Illness:    Stephanie Dudley is a 63 y.o. female who presents via video conferencing for a telehealth visit today.     The patient does not have symptoms concerning for COVID-19 infection (fever, chills, cough, or new shortness of breath).   She presents today for virtual visit. She prefers this method of contact due to COVID-19 pandemic. She reports she has been practicing social distancing, which has resulted in some weight gain. She recently started a walking program with her granddaughter.   Hypertension  This is a chronic problem. The current episode started more than 1 year ago. The problem has been gradually improving since  onset. The problem is uncontrolled. Pertinent negatives include no blurred vision, chest pain, palpitations or shortness of breath. Risk factors for coronary artery disease include obesity and post-menopausal state. Past treatments include beta blockers and angiotensin blockers. The current treatment provides moderate improvement.     Past Medical History:  Diagnosis Date  . Arthritis   . Breast mass, right 08/2017   Benign tissue  . Complication of anesthesia    hard to wake up with thyroid surgery  . Family history of breast cancer   . Family history of thyroid cancer   . Family history of uterine cancer   . Hyperlipidemia   . Hypertension   . Hypothyroidism   . Mitral valve prolapse    a. not seen on echo 01/2018.  Marland Kitchen Pericardial effusion   . Pericarditis   . Peripheral edema    ankles, feet   Past Surgical History:  Procedure Laterality Date  . APPENDECTOMY  1994  . BREAST BIOPSY Left 1999  . BREAST LUMPECTOMY WITH RADIOACTIVE SEED LOCALIZATION Right 09/28/2017   Procedure: RIGHT BREAST LUMPECTOMY WITH RADIOACTIVE SEED LOCALIZATION;  Surgeon: Erroll Luna, MD;  Location: [place];  Service: General;  Laterality: Right;  . BREAST SURGERY    . KNEE ARTHROSCOPY Left   . MIDDLE EAR SURGERY Left 2011  . THYROIDECTOMY  1974   goiter--total thyroidectomy  . TONSILLECTOMY  1972  . WRIST FRACTURE SURGERY       Current Meds  Medication Sig  . carvedilol (COREG) 12.5 MG tablet Take 1 tablet (12.5 mg total)  by mouth 2 (two) times daily.  . colchicine 0.6 MG tablet Take 1 tablet (0.6 mg total) by mouth 2 (two) times daily for 30 days.  . furosemide (LASIX) 40 MG tablet TAKE ONE TABLET BY MOUTH DAILY (Patient taking differently: 1 tab in the morning and 1/2 tab in the afternoon)  . pantoprazole (PROTONIX) 40 MG tablet Take 1 tablet (40 mg total) by mouth daily before breakfast.  . rosuvastatin (CRESTOR) 20 MG tablet TAKE ONE TABLET BY MOUTH DAILY (Patient taking  differently: 1/2 tab daily)  . spironolactone (ALDACTONE) 25 MG tablet Take 1 tablet (25 mg total) by mouth daily.  Marland Kitchen SYNTHROID 88 MCG tablet Take 1 tablet (88 mcg total) by mouth daily.  . valsartan (DIOVAN) 160 MG tablet Take 1 tablet (160 mg total) by mouth daily.     Allergies:   Sulfa antibiotics   Social History   Tobacco Use  . Smoking status: Former Smoker    Packs/day: 1.00    Years: 15.00    Pack years: 15.00  . Smokeless tobacco: Never Used  Substance Use Topics  . Alcohol use: Yes    Comment: wine 2x/wk  . Drug use: No     Family Hx: The patient's family history includes Breast cancer (age of onset: 58) in her sister; Heart Problems in her father; Hyperlipidemia in her father; Hypertension in her father; Thyroid cancer in her mother; Uterine cancer (age of onset: 52) in her paternal grandmother.  ROS:   Please see the history of present illness.    Review of Systems  Constitutional: Negative.   Eyes: Negative for blurred vision.  Respiratory: Negative.  Negative for shortness of breath.   Cardiovascular: Negative.  Negative for chest pain and palpitations.  Gastrointestinal: Negative.   Neurological: Negative.   Psychiatric/Behavioral: Negative.     All other systems reviewed and are negative.   Labs/Other Tests and Data Reviewed:    Recent Labs: 02/01/2018: ALT 16 02/02/2018: B Natriuretic Peptide 18.8 03/28/2018: TSH 0.745 05/03/2018: BUN 9; Creatinine, Ser 0.87; Hemoglobin 11.2; NT-Pro BNP 47; Platelets 292; Potassium 4.1; Sodium 140   Recent Lipid Panel Lab Results  Component Value Date/Time   CHOL 206 (H) 01/28/2018 04:00 PM   TRIG 346 (H) 01/28/2018 04:00 PM   HDL 59 01/28/2018 04:00 PM   CHOLHDL 3.5 01/28/2018 04:00 PM   LDLCALC 78 01/28/2018 04:00 PM    Wt Readings from Last 3 Encounters:  07/28/18 199 lb (90.3 kg)  05/03/18 195 lb 12.8 oz (88.8 kg)  04/13/18 190 lb 6.4 oz (86.4 kg)     Exam:    Vital Signs:  BP (!) 160/72 Comment: pt  provided  Pulse 98 Comment: pt provided  Ht 5' 6" (1.676 m)   Wt 199 lb (90.3 kg) Comment: pt provided  BMI 32.12 kg/m     Physical Exam  Constitutional: She is oriented to person, place, and time and well-developed, well-nourished, and in no distress.  HENT:  Head: Normocephalic and atraumatic.  Neck: Normal range of motion.  Pulmonary/Chest: Effort normal.  Neurological: She is alert and oriented to person, place, and time.  Psychiatric: Affect normal.  Nursing note and vitals reviewed.   ASSESSMENT & PLAN:     1. Essential hypertension  Uncontrolled. This reading is with her home meter. She will continue with current meds for now. She is encouraged to avoid adding salt to her foods. Importance of regular exercise was discussed with the patient. She agrees to come in next  week for bloodwork. I will also have someone recheck her bp at that time.   2. Recurrent idiopathic pericarditis  This appears to be resolving with use of prednisone and colchicine. She is managed by Cardiology. Pt reminded that prednisone can cause salt/sugar cravings which can result in weight gain and water retention.   3. Weight gain  She is encouraged to strive to lose ten percent of her body weight to decrease cardiac risk. Pt also advised that increasing her fiber intake could help decrease hunger and possibly cravings.   4. Class 1 obesity due to excess calories with serious comorbidity and body mass index (BMI) of 32.0 to 32.9 in adult  Importance of achieving optimal weight to decrease risk of cardiovascular disease and cancers was discussed with the patient in full detail. She is encouraged to start slowly - start with 10 minutes twice daily at least three to four days per week and to gradually build to 30 minutes five days weekly. She was given tips to incorporate more activity into her daily routine - take stairs when possible, park farther away from grocery stores, etc.      COVID-19 Education:  The signs and symptoms of COVID-19 were discussed with the patient and how to seek care for testing (follow up with PCP or arrange E-visit).  The importance of social distancing was discussed today.  Patient Risk:   After full review of this patients clinical status, I feel that they are at least moderate risk at this time.  Time:   Today, I have spent 14 minutes/ 11 seconds with the patient with telehealth technology discussing above diagnoses.     Medication Adjustments/Labs and Tests Ordered: Current medicines are reviewed at length with the patient today.  Concerns regarding medicines are outlined above.   Tests Ordered: Orders Placed This Encounter  Procedures  . BMP8+EGFR  . Hemoglobin A1c  . TSH  . T4, Free    Medication Changes: No orders of the defined types were placed in this encounter.   Disposition:  Follow up in 6 month(s)  Signed, Maximino Greenland, MD

## 2018-08-01 ENCOUNTER — Other Ambulatory Visit: Payer: Self-pay | Admitting: Internal Medicine

## 2018-08-01 ENCOUNTER — Ambulatory Visit: Payer: Federal, State, Local not specified - PPO

## 2018-08-01 ENCOUNTER — Encounter: Payer: Self-pay | Admitting: *Deleted

## 2018-08-01 ENCOUNTER — Other Ambulatory Visit: Payer: Self-pay

## 2018-08-01 ENCOUNTER — Other Ambulatory Visit: Payer: Federal, State, Local not specified - PPO

## 2018-08-01 VITALS — BP 130/62 | HR 73 | Temp 98.0°F

## 2018-08-01 DIAGNOSIS — I10 Essential (primary) hypertension: Secondary | ICD-10-CM

## 2018-08-01 DIAGNOSIS — R635 Abnormal weight gain: Secondary | ICD-10-CM | POA: Diagnosis not present

## 2018-08-02 ENCOUNTER — Other Ambulatory Visit: Payer: Federal, State, Local not specified - PPO

## 2018-08-02 LAB — T4, FREE: Free T4: 1.38 ng/dL (ref 0.82–1.77)

## 2018-08-02 LAB — HEMOGLOBIN A1C
Est. average glucose Bld gHb Est-mCnc: 117 mg/dL
Hgb A1c MFr Bld: 5.7 % — ABNORMAL HIGH (ref 4.8–5.6)

## 2018-08-02 LAB — BMP8+EGFR
BUN/Creatinine Ratio: 13 (ref 12–28)
BUN: 12 mg/dL (ref 8–27)
CO2: 24 mmol/L (ref 20–29)
Calcium: 9.5 mg/dL (ref 8.7–10.3)
Chloride: 102 mmol/L (ref 96–106)
Creatinine, Ser: 0.89 mg/dL (ref 0.57–1.00)
GFR calc Af Amer: 81 mL/min/{1.73_m2} (ref >59)
GFR calc non Af Amer: 70 mL/min/{1.73_m2} (ref >59)
Glucose: 90 mg/dL (ref 65–99)
Potassium: 4.3 mmol/L (ref 3.5–5.2)
Sodium: 142 mmol/L (ref 134–144)

## 2018-08-02 LAB — TSH: TSH: 0.866 u[IU]/mL (ref 0.450–4.500)

## 2018-08-03 ENCOUNTER — Encounter: Payer: Self-pay | Admitting: Physician Assistant

## 2018-08-03 NOTE — Progress Notes (Signed)
Virtual Visit via Video Note   This visit type was conducted due to national recommendations for restrictions regarding the COVID-19 Pandemic (e.g. social distancing) in an effort to limit this patient's exposure and mitigate transmission in our community.  Due to her co-morbid illnesses, this patient is at least at moderate risk for complications without adequate follow up.  This format is felt to be most appropriate for this patient at this time.  All issues noted in this document were discussed and addressed.  A limited physical exam was performed with this format.  Please refer to the patient's chart for her consent to telehealth for [facility].   Date:  08/04/2018   ID:  Stephanie Dudley, DOB 1956-03-05, MRN [id number]  Patient Location: Home Provider Location: Home  PCP:  Glendale Chard, MD  Cardiologist:  Ena Dawley, MD  Electrophysiologist:  None   Evaluation Performed:  Follow-Up Visit  Chief Complaint:  F/u 3 months of pericarditis and dyspnea  History of Present Illness:    Stephanie Dudley is a 63 y.o. female with HTN, HLD (followed by PCP), mild MVP, hypothyroidism,breast CAs/p ofright lumpectomy with seed,recent recurrentpericarditis with pericardial effusion who presents to f/u pericarditis.  To recap, she wasadmitted11/2019for idiopathic pericarditiswithelevated CRP/ESR and diffuse ST elevation on EKG.2D echo showed EF 65-70%, otherwise normal without pericardial effusion. She saw Lyda Jester 03/01/18 for follow up, having completed 2-week course of ibuprofen. Symptoms improved but never fully resolved so she was started onindomethacin 25 mg TID for the next 2 weeks followed by a taper, with recomendation to otherwise continue colchcine x 3 months. However, shepresented back to the hospital 03/09/18 with recurrent pleuritic chest pain. D-dimer was elevated.CT angiogram of the chest was negative for PE but did show vascular congestion and  small pericardial effusion. She was alsomildly tachycardic. ESR was further elevated and she had borderline rheumatoid factor. ANA was negative.She was treated with Toradol and prednisone. She was sent home with steroid taper over the course of 15 days. She was referred to rheumatology to evaluate the abnormal RF and recurrent pericarditis but rheumatology reviewed her chart and declined the referral. She was readmitted 1/6-03/29/2018 with recurrent pleuritic chest pain, sinus tach, and elevated CRP/ESR (although lower than prior). She had an episode of tachypalpitations while on telemetry and this showed sinus tachycardia with HR 150s (was not afib/flutter). She was felt to have recurrent pericarditis and longer steroid taper was recommended over 6 weeks' time. 2D echo 03/29/18 showed possible trivial free flowing pericardial effusion along RV free wall and apex. The longer taper seemed to ease symptoms and by 04/2018, ESR and CRP had normalized. She was reporting some DOE which she felt was related to weight gain. BNP was normal and repeat echo was reassuring (EF 60-65%, RV normal, mild MVP, no pericardial effusion). Last labs 07/2018 showed normal TSH and fT4, A1C 5.7, normal BMET, 04/2018 CBC wnl.  She is doing great without any recurrent chest pain. Her dyspnea has improved greatly with increasing her endurance by walking 30 mins daily. Edema is well controlled. BP is well controlled by home readings around 379-024 systolic. She did not yet take meds today. She has been off Protonix for several weeks (stopped after she finished Prednisone) and wonders if she needs to take this for any reason. No recurrent GERD sx. The patient does not have symptoms concerning for COVID-19 infection (fever, chills, cough, or new shortness of breath).    Past Medical History:  Diagnosis Date  .  Arthritis   . Breast mass, right 08/2017   Benign tissue  . Complication of anesthesia    hard to wake up with thyroid surgery  .  Family history of breast cancer   . Family history of thyroid cancer   . Family history of uterine cancer   . Hyperlipidemia   . Hypertension   . Hypothyroidism   . Mitral valve prolapse    a. mild by echo 04/2018.  . Obesity   . Pericardial effusion   . Pericarditis   . Peripheral edema    ankles, feet   Past Surgical History:  Procedure Laterality Date  . APPENDECTOMY  1994  . BREAST BIOPSY Left 1999  . BREAST LUMPECTOMY WITH RADIOACTIVE SEED LOCALIZATION Right 09/28/2017   Procedure: RIGHT BREAST LUMPECTOMY WITH RADIOACTIVE SEED LOCALIZATION;  Surgeon: Erroll Luna, MD;  Location: [place];  Service: General;  Laterality: Right;  . BREAST SURGERY    . KNEE ARTHROSCOPY Left   . MIDDLE EAR SURGERY Left 2011  . THYROIDECTOMY  1974   goiter--total thyroidectomy  . TONSILLECTOMY  1972  . WRIST FRACTURE SURGERY       Current Meds  Medication Sig  . carvedilol (COREG) 12.5 MG tablet Take 1 tablet (12.5 mg total) by mouth 2 (two) times daily.  . colchicine 0.6 MG tablet Take 1 tablet (0.6 mg total) by mouth 2 (two) times daily for 30 days.  . furosemide (LASIX) 40 MG tablet Take 1 tablet by mouth in the a.m, take 1/2 tablet by mouth in the afternoon  . rosuvastatin (CRESTOR) 20 MG tablet Take 10 mg by mouth daily.  Marland Kitchen spironolactone (ALDACTONE) 25 MG tablet Take 1 tablet (25 mg total) by mouth daily.  Marland Kitchen SYNTHROID 88 MCG tablet Take 1 tablet (88 mcg total) by mouth daily.  . valsartan (DIOVAN) 160 MG tablet Take 1 tablet (160 mg total) by mouth daily.  . [DISCONTINUED] carvedilol (COREG) 12.5 MG tablet Take 1 tablet (12.5 mg total) by mouth 2 (two) times daily.  . [DISCONTINUED] colchicine 0.6 MG tablet Take 1 tablet (0.6 mg total) by mouth 2 (two) times daily for 30 days.  . [DISCONTINUED] furosemide (LASIX) 40 MG tablet TAKE ONE TABLET BY MOUTH DAILY (Patient taking differently: 1 tab in the morning and 1/2 tab in the afternoon)  . [DISCONTINUED] pantoprazole  (PROTONIX) 40 MG tablet Take 1 tablet (40 mg total) by mouth daily before breakfast.  . [DISCONTINUED] rosuvastatin (CRESTOR) 20 MG tablet TAKE ONE TABLET BY MOUTH DAILY (Patient taking differently: 1/2 tab daily)     Allergies:   Sulfa antibiotics   Social History   Tobacco Use  . Smoking status: Former Smoker    Packs/day: 1.00    Years: 15.00    Pack years: 15.00  . Smokeless tobacco: Never Used  Substance Use Topics  . Alcohol use: Yes    Comment: wine 2x/wk  . Drug use: No     Family Hx: The patient's family history includes Breast cancer (age of onset: 30) in her sister; Heart Problems in her father; Hyperlipidemia in her father; Hypertension in her father; Thyroid cancer in her mother; Uterine cancer (age of onset: 69) in her paternal grandmother.  ROS:   Please see the history of present illness.    All other systems reviewed and are negative.   Prior CV studies:   Most recent pertinent cardiac studies are outlined above.   Labs/Other Tests and Data Reviewed:    EKG:  An  ECG dated 05/03/18 was personally reviewed today and demonstrated:  NSR 79bpm, nonspecific ST-Changes, no acute changes. No ST elevation or TWI.  Recent Labs: 02/01/2018: ALT 16 02/02/2018: B Natriuretic Peptide 18.8 05/03/2018: Hemoglobin 11.2; NT-Pro BNP 47; Platelets 292 08/01/2018: BUN 12; Creatinine, Ser 0.89; Potassium 4.3; Sodium 142; TSH 0.866   Recent Lipid Panel Lab Results  Component Value Date/Time   CHOL 206 (H) 01/28/2018 04:00 PM   TRIG 346 (H) 01/28/2018 04:00 PM   HDL 59 01/28/2018 04:00 PM   CHOLHDL 3.5 01/28/2018 04:00 PM   LDLCALC 78 01/28/2018 04:00 PM    Wt Readings from Last 3 Encounters:  08/04/18 196 lb (88.9 kg)  07/28/18 199 lb (90.3 kg)  05/03/18 195 lb 12.8 oz (88.8 kg)     Objective:    Vital Signs:  BP 130/88   Pulse 71   Ht '5\' 6"'$  (1.676 m)   Wt 196 lb (88.9 kg)   BMI 31.64 kg/m    VITAL SIGNS:  reviewed  General - cheerful AAF in no acute  distress HEENT - NCAT, EOM intact Pulm - No labored breathing, no coughing during visit, no audible wheezing, speaking in full sentences Neuro - A+Ox3, no slurred speech, answers questions appropriately MSK - moves UE spontaneously at will Psych - Pleasant affect     ASSESSMENT & PLAN:    1. Pericarditis - finally quiescent after longer steroid taper earlier this year. Now off prednisone. Per guidelines, continue colchicine 6 months from last recurrence (03/2018). I instructed her to decrease this to once daily the first week of July. If still feeling well, OK to stop colchicine the second week of July. When she stops colchicine, she may return to prior dose of rosuvastatin which was '20mg'$  (1 full tablet) daily. This was decreased while on colchicine given drug interaction. Now that she is off prednisone and not experiencing any GERD, she is also OK to remain off pantoprazole. 2. Essential HTN - controlled by home readings. She is really good about following and notifying for any concerns. OK to refill carvedilol today. Recent labs this month satisfactory.  3. Exertional dyspnea - was likely due to weight gain after prednisone, pericarditis. This has resolved. 4. Mild mitral valve prolapse - has long history of this, sometimes seen on echoes and other times not. Doubt clinically significant. Further surveillance will be at the discretion of her primary cardiologist.  COVID-19 Education: The signs and symptoms of COVID-19 were discussed with the patient and how to seek care for testing (follow up with PCP or arrange E-visit). The importance of social distancing was discussed today.  Time:   Today, I have spent 18 minutes with the patient with telehealth technology discussing the above problems.     Medication Adjustments/Labs and Tests Ordered: Current medicines are reviewed at length with the patient today.  Concerns regarding medicines are outlined above.   Disposition:  Follow up 1 year  with Dr. Meda Coffee, sooner if any symptoms recur  Signed, Charlie Pitter, PA-C  08/04/2018 9:17 AM    [place]

## 2018-08-04 ENCOUNTER — Encounter: Payer: Self-pay | Admitting: Physician Assistant

## 2018-08-04 ENCOUNTER — Other Ambulatory Visit: Payer: Self-pay

## 2018-08-04 ENCOUNTER — Telehealth (INDEPENDENT_AMBULATORY_CARE_PROVIDER_SITE_OTHER): Payer: Federal, State, Local not specified - PPO | Admitting: Physician Assistant

## 2018-08-04 VITALS — BP 130/88 | HR 71 | Ht 66.0 in | Wt 196.0 lb

## 2018-08-04 DIAGNOSIS — I10 Essential (primary) hypertension: Secondary | ICD-10-CM

## 2018-08-04 DIAGNOSIS — I341 Nonrheumatic mitral (valve) prolapse: Secondary | ICD-10-CM

## 2018-08-04 DIAGNOSIS — R0600 Dyspnea, unspecified: Secondary | ICD-10-CM

## 2018-08-04 DIAGNOSIS — R0609 Other forms of dyspnea: Secondary | ICD-10-CM | POA: Diagnosis not present

## 2018-08-04 DIAGNOSIS — I300 Acute nonspecific idiopathic pericarditis: Secondary | ICD-10-CM | POA: Diagnosis not present

## 2018-08-04 MED ORDER — COLCHICINE 0.6 MG PO TABS: 0.6000 mg | ORAL_TABLET | Freq: Two times a day (BID) | ORAL | 1 refills | Status: DC

## 2018-08-04 MED ORDER — CARVEDILOL 12.5 MG PO TABS: 12.5000 mg | ORAL_TABLET | Freq: Two times a day (BID) | ORAL | 3 refills | Status: DC

## 2018-08-04 MED ORDER — COLCHICINE 0.6 MG PO TABS: ORAL_TABLET | ORAL | 1 refills | Status: DC

## 2018-08-04 NOTE — Patient Instructions (Signed)
Medication Instructions:  Your physician has recommended you make the following change in your medication:  1.  STOP the Protonix   Continue colchicine through July.  The first week of July, decrease colchicine to once daily. If you feel fine with this change, the second week of July you can stop colchicine altogether.   When you stop colchicine, you can return to 1 full Crestor (rosuvastatin) tablet daily   Keep up the good work!    If you need a refill on your cardiac medications before your next appointment, please call your pharmacy.   Lab work: None ordered  If you have labs (blood work) drawn today and your tests are completely normal, you will receive your results only by: Marland Kitchen MyChart Message (if you have MyChart) OR . A paper copy in the mail If you have any lab test that is abnormal or we need to change your treatment, we will call you to review the results.  Testing/Procedures: None ordered  Follow-Up: At [facility], you and your health needs are our priority.  As part of our continuing mission to provide you with exceptional heart care, we have created designated Provider Care Teams.  These Care Teams include your primary Cardiologist (physician) and Advanced Practice Providers (APPs -  Physician Assistants and Nurse Practitioners) who all work together to provide you with the care you need, when you need it. You will need a follow up appointment in 12 months.  Please call our office 2 months in advance to schedule this appointment.  You may see Ena Dawley, MD or one of the following Advanced Practice Providers on your designated Care Team:   Halliday, PA-C Melina Copa, PA-C . Ermalinda Barrios, PA-C  Any Other Special Instructions Will Be Listed Below (If Applicable).

## 2018-08-04 NOTE — Addendum Note (Signed)
Addended by: Gaetano Net on: 08/04/2018 09:28 AM   Modules accepted: Orders

## 2018-08-14 ENCOUNTER — Other Ambulatory Visit: Payer: Self-pay | Admitting: Internal Medicine

## 2018-09-12 ENCOUNTER — Other Ambulatory Visit: Payer: Self-pay | Admitting: Internal Medicine

## 2018-09-15 ENCOUNTER — Other Ambulatory Visit: Payer: Self-pay

## 2018-09-15 MED ORDER — FUROSEMIDE 40 MG PO TABS: ORAL_TABLET | ORAL | 1 refills | Status: DC

## 2018-09-16 ENCOUNTER — Other Ambulatory Visit: Payer: Self-pay | Admitting: Surgery

## 2018-09-16 DIAGNOSIS — Z1231 Encounter for screening mammogram for malignant neoplasm of breast: Secondary | ICD-10-CM

## 2018-10-11 ENCOUNTER — Other Ambulatory Visit: Payer: Self-pay | Admitting: Internal Medicine

## 2018-10-16 ENCOUNTER — Other Ambulatory Visit: Payer: Self-pay | Admitting: Internal Medicine

## 2018-10-29 ENCOUNTER — Encounter: Payer: Self-pay | Admitting: Sports Medicine

## 2018-11-01 ENCOUNTER — Other Ambulatory Visit: Payer: Self-pay | Admitting: Sports Medicine

## 2018-11-01 ENCOUNTER — Other Ambulatory Visit: Payer: Self-pay

## 2018-11-01 ENCOUNTER — Ambulatory Visit: Payer: Federal, State, Local not specified - PPO | Admitting: Sports Medicine

## 2018-11-01 ENCOUNTER — Encounter: Payer: Self-pay | Admitting: Sports Medicine

## 2018-11-01 ENCOUNTER — Ambulatory Visit (INDEPENDENT_AMBULATORY_CARE_PROVIDER_SITE_OTHER): Payer: Federal, State, Local not specified - PPO

## 2018-11-01 VITALS — Temp 98.2°F

## 2018-11-01 DIAGNOSIS — M778 Other enthesopathies, not elsewhere classified: Secondary | ICD-10-CM

## 2018-11-01 DIAGNOSIS — M79671 Pain in right foot: Secondary | ICD-10-CM

## 2018-11-01 DIAGNOSIS — M19071 Primary osteoarthritis, right ankle and foot: Secondary | ICD-10-CM | POA: Diagnosis not present

## 2018-11-01 DIAGNOSIS — M779 Enthesopathy, unspecified: Secondary | ICD-10-CM | POA: Diagnosis not present

## 2018-11-01 DIAGNOSIS — M6740 Ganglion, unspecified site: Secondary | ICD-10-CM | POA: Diagnosis not present

## 2018-11-01 MED ORDER — DICLOFENAC SODIUM 1 % TD GEL: 4.0000 g | Freq: Four times a day (QID) | TRANSDERMAL | 2 refills | Status: DC

## 2018-11-01 NOTE — Progress Notes (Signed)
Subjective: Stephanie Dudley is a 64 y.o. female patient who presents to office for evaluation of right foot pain.  Patient reports that there has been an episode of increased swelling and pain across the top of the right foot reports that the pain sometimes radiates along the first toe and is painful with pressure states that the swelling started to increase over the last week.  Denies injury/trip/fall/sprain/any causative factors.   Patient Active Problem List   Diagnosis Date Noted  . Pericarditis 03/28/2018  . Breast cancer ([place]) 02/02/2018  . Chest pain 02/02/2018  . 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 Hyperlipidemia   . Hypertension   . Hypothyroidism   . Ankle swelling   . Genetic testing 11/08/2017  . Family history of breast cancer   . Family history of thyroid cancer   . Family history of uterine cancer     Current Outpatient Medications on File Prior to Visit  Medication Sig Dispense Refill  . carvedilol (COREG) 12.5 MG tablet Take 1 tablet (12.5 mg total) by mouth 2 (two) times daily. 180 tablet 3  . colchicine 0.6 MG tablet TAKE AS DIRECTED 60 tablet 1  . furosemide (LASIX) 40 MG tablet TAKE ONE TABLET BY MOUTH DAILY 90 tablet 0  . furosemide (LASIX) 40 MG tablet Take 1 tablet by mouth in the a.m, take 1/2 tablet by mouth in the afternoon 180 tablet 1  . rosuvastatin (CRESTOR) 20 MG tablet Take 10 mg by mouth daily.    Marland Kitchen spironolactone (ALDACTONE) 25 MG tablet TAKE ONE TABLET BY MOUTH DAILY 90 tablet 1  . SYNTHROID 88 MCG tablet TAKE ONE TABLET BY MOUTH DAILY 90 tablet 1  . valsartan (DIOVAN) 160 MG tablet TAKE ONE TABLET BY MOUTH DAILY 90 tablet 1   No current facility-administered medications on file prior to visit.     Allergies  Allergen Reactions  . Sulfa Antibiotics Swelling    Objective:  General: Alert and oriented x3 in no acute distress  Dermatology: No open lesions bilateral lower extremities, no webspace macerations, no ecchymosis  bilateral, all nails x 10 are well manicured.  Vascular: Dorsalis Pedis and Posterior Tibial pedal pulses palpable, Capillary Fill Time 3 seconds,(+) pedal hair growth bilateral, no edema bilateral lower extremities, Temperature gradient within normal limits.  Neurology: Johney Maine sensation intact via light touch bilateral.   Musculoskeletal: Mild tenderness with palpation at dorsal midfoot on right that radiates along the extensor hallucis longus tendon to the great toe,No pain with calf compression bilateral. There is decreased ankle rom with knee extending  vs flexed resembling gastroc equnius bilateral, Subtalar joint range of motion is within normal limits, there is no 1st ray hypermobility noted bilateral, decreased 1st MPJ rom Right>Left with functional limitus noted on weightbearing exam and pes planus deformity. Strength within normal limits in all groups bilateral.   Gait: Antalgic gait  Xrays  Right foot   Impression: Normal osseous mineralization there is joint space narrowing at the midfoot supportive of arthritis with pes planus breech supportive of pes planus deformity, mild soft tissue swelling no other acute findings.  Assessment and Plan: Problem List Items Addressed This Visit    None    Visit Diagnoses    Pain in right foot    -  Primary   Capsulitis of right foot       Arthritis of right foot       Ganglion cyst       Tendonitis          -  Complete examination performed -Xrays reviewed -Discussed treatement options for arthritis vs capsulitis vs tendonitis of the right foot -Patient declined injection at this time -Recommend patient to use topical Voltaren PRN -Advised patient rest ice elevation and soaking as needed with Epson salt on the right -Patient to return to office as needed or sooner if condition worsens. Advised patient if pain is not better to return for injection.  Landis Martins, DPM

## 2018-11-02 ENCOUNTER — Ambulatory Visit
Admission: RE | Admit: 2018-11-02 | Discharge: 2018-11-02 | Disposition: A | Discharge status: Home / Self Care | Payer: Federal, State, Local not specified - PPO | Source: Ambulatory Visit | Attending: Surgery | Admitting: Surgery

## 2018-11-02 DIAGNOSIS — Z1231 Encounter for screening mammogram for malignant neoplasm of breast: Secondary | ICD-10-CM

## 2018-11-15 ENCOUNTER — Encounter: Payer: Self-pay | Admitting: Sports Medicine

## 2018-11-15 ENCOUNTER — Other Ambulatory Visit: Payer: Self-pay

## 2018-11-15 ENCOUNTER — Ambulatory Visit: Payer: Federal, State, Local not specified - PPO | Admitting: Sports Medicine

## 2018-11-15 DIAGNOSIS — M779 Enthesopathy, unspecified: Secondary | ICD-10-CM | POA: Diagnosis not present

## 2018-11-15 DIAGNOSIS — M19071 Primary osteoarthritis, right ankle and foot: Secondary | ICD-10-CM

## 2018-11-15 DIAGNOSIS — M6740 Ganglion, unspecified site: Secondary | ICD-10-CM

## 2018-11-15 DIAGNOSIS — M79671 Pain in right foot: Secondary | ICD-10-CM

## 2018-11-15 DIAGNOSIS — M778 Other enthesopathies, not elsewhere classified: Secondary | ICD-10-CM

## 2018-11-15 MED ORDER — TRIAMCINOLONE ACETONIDE 10 MG/ML IJ SUSP
10.0000 mg | Freq: Once | INTRAMUSCULAR | Status: AC
  Administered 2018-11-15: 10 mg

## 2018-11-15 NOTE — Progress Notes (Signed)
Subjective: Stephanie Dudley is a 63 y.o. female patient who returns to office for follow up evaluation of right foot pain.  Patient reports that the swelling and pain across the top of the foot is about the same reports that certain shoes aggravate the foot and patient requests injection at this visit.  Patient denies changes with medical history or medications since last encounter.  Patient denies any new injury twist sprain fall or any other additive factors to her pain at this visit.    Patient Active Problem List   Diagnosis Date Noted  . Pericarditis 03/28/2018  . Breast cancer ([place]) 02/02/2018  . Chest pain 02/02/2018  . 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 Hyperlipidemia   . Hypertension   . Hypothyroidism   . Ankle swelling   . Genetic testing 11/08/2017  . Family history of breast cancer   . Family history of thyroid cancer   . Family history of uterine cancer     Current Outpatient Medications on File Prior to Visit  Medication Sig Dispense Refill  . colchicine 0.6 MG tablet TAKE AS DIRECTED 60 tablet 1  . diclofenac sodium (VOLTAREN) 1 % GEL Apply 4 g topically 4 (four) times daily. For foot pain 150 g 2  . furosemide (LASIX) 40 MG tablet TAKE ONE TABLET BY MOUTH DAILY 90 tablet 0  . furosemide (LASIX) 40 MG tablet Take 1 tablet by mouth in the a.m, take 1/2 tablet by mouth in the afternoon 180 tablet 1  . rosuvastatin (CRESTOR) 20 MG tablet Take 10 mg by mouth daily.    Marland Kitchen spironolactone (ALDACTONE) 25 MG tablet TAKE ONE TABLET BY MOUTH DAILY 90 tablet 1  . SYNTHROID 88 MCG tablet TAKE ONE TABLET BY MOUTH DAILY 90 tablet 1  . valsartan (DIOVAN) 160 MG tablet TAKE ONE TABLET BY MOUTH DAILY 90 tablet 1  . carvedilol (COREG) 12.5 MG tablet Take 1 tablet (12.5 mg total) by mouth 2 (two) times daily. 180 tablet 3   No current facility-administered medications on file prior to visit.     Allergies  Allergen Reactions  . Sulfa Antibiotics Swelling     Objective:  General: Alert and oriented x3 in no acute distress  Dermatology: No open lesions bilateral lower extremities, no webspace macerations, no ecchymosis bilateral, all nails x 10 are well manicured.  Vascular: Dorsalis Pedis and Posterior Tibial pedal pulses palpable, Capillary Fill Time 3 seconds,(+) pedal hair growth bilateral, no edema bilateral lower extremities, Temperature gradient within normal limits.  Neurology: Johney Maine sensation intact via light touch bilateral.   Musculoskeletal: Mild tenderness with palpation at dorsal midfoot on right at area of bony prominence to the dorsal aspect of the right foot.  Assessment and Plan: Problem List Items Addressed This Visit    None    Visit Diagnoses    Ganglion cyst    -  Primary   Capsulitis of right foot       Arthritis of right foot       Tendonitis       Right foot pain          -Complete examination performed -Xrays reviewed -Discussed treatement options for arthritis vs capsulitis vs tendonitis of the right foot -After verbal consent for aspiration right midfoot the right foot was then prepped and draped a local field block was administered utilizing 3 cc of one-to-one mixture of 1% lidocaine plain over the area of raised soft tissue fluctuant likely consistent with cyst versus capsulitis versus inflammation  secondary to tendinitis versus arthritis.  After anesthesia was then confirmed supplemented the area as well with a topical anesthetic cream then inserted a 18-gauge needle to aspirate any fluid to the area there is very minimal clear to bloody drainage evacuated from the area then injected 0.5 cc of Kenalog mixed with lidocaine and Marcaine to the area totaling 2 cc and dressed with a compressive antibiotic cream dressing.  Gave patient dressing supplies to dress every day using antibiotic cream and a compression Coban wrap every day for the next week.  Advised patient to closely monitor if area appears to worsen to  come back to clinic or office sooner. -Recommend patient to use topical Voltaren PRN if pain recurs -Advised patient rest ice elevation  -Recommend good supportive shoes daily that do not rub or irritate top of foot -Return to office in 4 weeks for follow-up evaluation or sooner if problems or issues arise.  Landis Martins, DPM

## 2018-11-24 ENCOUNTER — Other Ambulatory Visit: Payer: Self-pay

## 2018-11-24 ENCOUNTER — Encounter: Payer: Self-pay | Admitting: Internal Medicine

## 2018-11-24 ENCOUNTER — Ambulatory Visit: Payer: Federal, State, Local not specified - PPO | Admitting: Internal Medicine

## 2018-11-24 VITALS — BP 130/78 | HR 82 | Temp 98.2°F | Ht 64.0 in | Wt 196.6 lb

## 2018-11-24 DIAGNOSIS — Z23 Encounter for immunization: Secondary | ICD-10-CM | POA: Diagnosis not present

## 2018-11-24 DIAGNOSIS — E6609 Other obesity due to excess calories: Secondary | ICD-10-CM

## 2018-11-24 DIAGNOSIS — Z6833 Body mass index (BMI) 33.0-33.9, adult: Secondary | ICD-10-CM | POA: Diagnosis not present

## 2018-11-24 DIAGNOSIS — F419 Anxiety disorder, unspecified: Secondary | ICD-10-CM

## 2018-11-24 DIAGNOSIS — G4709 Other insomnia: Secondary | ICD-10-CM | POA: Diagnosis not present

## 2018-11-24 MED ORDER — ESCITALOPRAM OXALATE 10 MG PO TABS: 10.0000 mg | ORAL_TABLET | Freq: Every day | ORAL | 2 refills | Status: DC

## 2018-11-24 NOTE — Patient Instructions (Signed)

## 2018-11-25 LAB — CBC
Hematocrit: 36.4 % (ref 34.0–46.6)
Hemoglobin: 12.1 g/dL (ref 11.1–15.9)
MCH: 30.3 pg (ref 26.6–33.0)
MCHC: 33.2 g/dL (ref 31.5–35.7)
MCV: 91 fL (ref 79–97)
Platelets: 271 10*3/uL (ref 150–450)
RBC: 4 x10E6/uL (ref 3.77–5.28)
RDW: 12.9 % (ref 11.7–15.4)
WBC: 9 10*3/uL (ref 3.4–10.8)

## 2018-11-25 LAB — TSH: TSH: 1.28 u[IU]/mL (ref 0.450–4.500)

## 2018-11-28 NOTE — Progress Notes (Signed)
Subjective:     Patient ID: Stephanie Dudley , female    DOB: 09-15-1955 , 63 y.o.   MRN: [id number]   Chief Complaint  Patient presents with  . Anxiety    HPI  She presents today for further evaluation of possible anxiety. She reports she is feeling more anxious at night and has had difficulty sleeping.  She feels well otherwise. She is unable to identify any new stressors. States she is unable to cut her mind off at night. She os often restless at night which causes her husband to have difficulty sleeping as well. She reports she has been on Lexapro in the past. She would like to resume this if possible.   Anxiety Presents for initial visit. Onset was 1 to 4 weeks ago. The problem has been gradually worsening. Symptoms include excessive worry, insomnia and nervous/anxious behavior. Patient reports no chest pain, decreased concentration, depressed mood, feeling of choking, muscle tension or nausea. Symptoms occur most days. The severity of symptoms is causing significant distress and moderate. Nothing aggravates the symptoms. The quality of sleep is poor. Nighttime awakenings: several.   Risk factors include history of steroid use. Past treatments include nothing.     Past Medical History:  Diagnosis Date  . Arthritis   . Breast mass, right 08/2017   Benign tissue  . Complication of anesthesia    hard to wake up with thyroid surgery  . Family history of breast cancer   . Family history of thyroid cancer   . Family history of uterine cancer   . Hyperlipidemia   . Hypertension   . Hypothyroidism   . Mitral valve prolapse    a. mild by echo 04/2018.  . Obesity   . Pericardial effusion   . Pericarditis   . Peripheral edema    ankles, feet     Family History  Problem Relation Age of Onset  . Breast cancer Sister 85  . Thyroid cancer Mother   . Hypertension Father   . Hyperlipidemia Father   . Heart Problems Father   . Uterine cancer Paternal Grandmother 52      Current Outpatient Medications:  .  carvedilol (COREG) 12.5 MG tablet, Take 1 tablet (12.5 mg total) by mouth 2 (two) times daily., Disp: 180 tablet, Rfl: 3 .  colchicine 0.6 MG tablet, TAKE AS DIRECTED, Disp: 60 tablet, Rfl: 1 .  diclofenac sodium (VOLTAREN) 1 % GEL, Apply 4 g topically 4 (four) times daily. For foot pain, Disp: 150 g, Rfl: 2 .  furosemide (LASIX) 40 MG tablet, Take 1 tablet by mouth in the a.m, take 1/2 tablet by mouth in the afternoon, Disp: 180 tablet, Rfl: 1 .  rosuvastatin (CRESTOR) 20 MG tablet, Take 10 mg by mouth daily., Disp: , Rfl:  .  spironolactone (ALDACTONE) 25 MG tablet, TAKE ONE TABLET BY MOUTH DAILY, Disp: 90 tablet, Rfl: 1 .  escitalopram (LEXAPRO) 10 MG tablet, Take 1 tablet (10 mg total) by mouth daily., Disp: 30 tablet, Rfl: 2 .  SYNTHROID 88 MCG tablet, TAKE ONE TABLET BY MOUTH DAILY, Disp: 90 tablet, Rfl: 1 .  valsartan (DIOVAN) 160 MG tablet, TAKE ONE TABLET BY MOUTH DAILY, Disp: 90 tablet, Rfl: 1   Allergies  Allergen Reactions  . Sulfa Antibiotics Swelling     Review of Systems  Constitutional: Negative.   Respiratory: Negative.   Cardiovascular: Negative.  Negative for chest pain.  Gastrointestinal: Negative.  Negative for nausea.  Neurological: Negative.   Psychiatric/Behavioral: Positive  for sleep disturbance. Negative for decreased concentration. The patient is nervous/anxious and has insomnia.      Today's Vitals   11/24/18 1623  BP: 130/78  Pulse: 82  Temp: 98.2 F (36.8 C)  TempSrc: Oral  SpO2: 97%  Weight: 196 lb 9.6 oz (89.2 kg)  Height: 5\' 4"  (1.626 m)   Body mass index is 33.75 kg/m.   Objective:  Physical Exam Vitals signs and nursing note reviewed.  Constitutional:      Appearance: Normal appearance.  HENT:     Head: Normocephalic and atraumatic.  Cardiovascular:     Rate and Rhythm: Normal rate and regular rhythm.     Heart sounds: Normal heart sounds.  Pulmonary:     Effort: Pulmonary effort is  normal.     Breath sounds: Normal breath sounds.  Skin:    General: Skin is warm.  Neurological:     General: No focal deficit present.     Mental Status: She is alert.  Psychiatric:        Mood and Affect: Mood normal.        Behavior: Behavior normal.         Assessment And Plan:     1. Anxiety  I will check labs as listed below. I will adjust thyroid meds as needed.  I will send in rx escitalopram, 10mg  daily as requested. She is advised to take with her evening meal. She is encouraged to rto in six weeks for re-evaluation. She was also advised to contact me sooner if needed.   - CBC no Diff - TSH  2. Other insomnia  Likely related to her anxiety. She does not wish to take any sleep aids at this time, even OTC.  She is afraid of the "bad side effects". She is advised to start taking magnesium nightly. She may also benefit from golden milk nightly.   3. Class 1 obesity due to excess calories with serious comorbidity and body mass index (BMI) of 33.0 to 33.9 in adult  She is encouraged to strive to lose 10-15 pounds to decrease cardiac risk. She is encouraged to strive for 150 minutes of exercise per week.  4. Flu vaccine need  She was given flu vaccine to update her immunization history.   Maximino Greenland, MD    THE PATIENT IS ENCOURAGED TO PRACTICE SOCIAL DISTANCING DUE TO THE COVID-19 PANDEMIC.

## 2018-12-05 ENCOUNTER — Encounter: Payer: Self-pay | Admitting: Internal Medicine

## 2018-12-20 ENCOUNTER — Ambulatory Visit: Payer: Federal, State, Local not specified - PPO | Admitting: Sports Medicine

## 2019-01-31 ENCOUNTER — Encounter: Payer: Self-pay | Admitting: Internal Medicine

## 2019-01-31 ENCOUNTER — Other Ambulatory Visit: Payer: Self-pay

## 2019-01-31 ENCOUNTER — Ambulatory Visit: Payer: Federal, State, Local not specified - PPO | Admitting: Internal Medicine

## 2019-01-31 DIAGNOSIS — Z79899 Other long term (current) drug therapy: Secondary | ICD-10-CM | POA: Diagnosis not present

## 2019-01-31 DIAGNOSIS — R7309 Other abnormal glucose: Secondary | ICD-10-CM | POA: Diagnosis not present

## 2019-01-31 DIAGNOSIS — I10 Essential (primary) hypertension: Secondary | ICD-10-CM | POA: Diagnosis not present

## 2019-01-31 DIAGNOSIS — Z0000 Encounter for general adult medical examination without abnormal findings: Secondary | ICD-10-CM

## 2019-01-31 LAB — POCT URINALYSIS DIPSTICK
Bilirubin, UA: NEGATIVE
Blood, UA: NEGATIVE
Glucose, UA: NEGATIVE
Ketones, UA: NEGATIVE
Leukocytes, UA: NEGATIVE
Nitrite, UA: NEGATIVE
Protein, UA: NEGATIVE
Spec Grav, UA: 1.015 (ref 1.010–1.025)
Urobilinogen, UA: 0.2 E.U./dL
pH, UA: 6 (ref 5.0–8.0)

## 2019-01-31 LAB — POCT UA - MICROALBUMIN
Albumin/Creatinine Ratio, Urine, POC: 30
Creatinine, POC: 10 mg/dL
Microalbumin Ur, POC: 10 mg/L

## 2019-01-31 MED ORDER — ESCITALOPRAM OXALATE 10 MG PO TABS: 10.0000 mg | ORAL_TABLET | Freq: Every day | ORAL | 2 refills | Status: DC

## 2019-01-31 NOTE — Progress Notes (Signed)
Subjective:     Patient ID: Stephanie Dudley , female    DOB: 11/08/1955 , 63 y.o.   MRN: [id number]   Chief Complaint  Patient presents with  . Annual Exam  . Hypertension    HPI  She is here today for a full physical examination.  She is followed by her GYN, Dr. Garwin Brothers for her pelvic exams.  She reports having an appt next month.   Hypertension This is a chronic problem. The current episode started more than 1 year ago. The problem has been gradually improving since onset. The problem is uncontrolled. Pertinent negatives include no blurred vision, chest pain, palpitations or shortness of breath. Risk factors for coronary artery disease include obesity, sedentary lifestyle and post-menopausal state. Past treatments include beta blockers. The current treatment provides moderate improvement. Compliance problems include exercise.      Past Medical History:  Diagnosis Date  . Arthritis   . Breast mass, right 08/2017   Benign tissue  . Complication of anesthesia    hard to wake up with thyroid surgery  . Family history of breast cancer   . Family history of thyroid cancer   . Family history of uterine cancer   . Hyperlipidemia   . Hypertension   . Hypothyroidism   . Mitral valve prolapse    a. mild by echo 04/2018.  . Obesity   . Pericardial effusion   . Pericarditis   . Peripheral edema    ankles, feet     Family History  Problem Relation Age of Onset  . Breast cancer Sister 33  . Thyroid cancer Mother   . Hypertension Father   . Hyperlipidemia Father   . Heart Problems Father   . Uterine cancer Paternal Grandmother 77     Current Outpatient Medications:  .  escitalopram (LEXAPRO) 10 MG tablet, Take 1 tablet (10 mg total) by mouth daily., Disp: 30 tablet, Rfl: 2 .  furosemide (LASIX) 40 MG tablet, Take 1 tablet by mouth in the a.m, take 1/2 tablet by mouth in the afternoon, Disp: 180 tablet, Rfl: 1 .  rosuvastatin (CRESTOR) 20 MG tablet, Take 10 mg by mouth  daily., Disp: , Rfl:  .  spironolactone (ALDACTONE) 25 MG tablet, TAKE ONE TABLET BY MOUTH DAILY, Disp: 90 tablet, Rfl: 1 .  SYNTHROID 88 MCG tablet, TAKE ONE TABLET BY MOUTH DAILY, Disp: 90 tablet, Rfl: 1 .  valsartan (DIOVAN) 160 MG tablet, TAKE ONE TABLET BY MOUTH DAILY, Disp: 90 tablet, Rfl: 1   Allergies  Allergen Reactions  . Sulfa Antibiotics Swelling     Review of Systems  Constitutional: Negative.   HENT: Negative.   Eyes: Negative.  Negative for blurred vision.  Respiratory: Negative.  Negative for shortness of breath.   Cardiovascular: Negative.  Negative for chest pain and palpitations.  Endocrine: Negative.   Genitourinary: Negative.   Musculoskeletal: Negative.   Skin: Negative.   Allergic/Immunologic: Negative.   Neurological: Negative.   Hematological: Negative.   Psychiatric/Behavioral: Negative.      Today's Vitals   01/31/19 0935  BP: (!) 148/72  Pulse: 72  Temp: 98.8 F (37.1 C)  TempSrc: Oral  Weight: 193 lb (87.5 kg)  Height: 5' 4.6" (1.641 m)   Body mass index is 32.52 kg/m.   Objective:  Physical Exam Vitals signs and nursing note reviewed.  Constitutional:      Appearance: Normal appearance. She is obese.  HENT:     Head: Normocephalic and atraumatic.  Right Ear: Tympanic membrane, ear canal and external ear normal.     Left Ear: Tympanic membrane, ear canal and external ear normal.     Nose: Nose normal.     Mouth/Throat:     Mouth: Mucous membranes are moist.     Pharynx: Oropharynx is clear.  Eyes:     Extraocular Movements: Extraocular movements intact.     Conjunctiva/sclera: Conjunctivae normal.     Pupils: Pupils are equal, round, and reactive to light.  Neck:     Musculoskeletal: Normal range of motion and neck supple.  Cardiovascular:     Rate and Rhythm: Normal rate and regular rhythm.     Pulses: Normal pulses.     Heart sounds: Normal heart sounds.  Pulmonary:     Effort: Pulmonary effort is normal.     Breath  sounds: Normal breath sounds.  Chest:     Breasts: Tanner Score is 5.        Right: Normal.        Left: Normal.  Abdominal:     General: Abdomen is flat. Bowel sounds are normal.     Palpations: Abdomen is soft.  Genitourinary:    Comments: deferred Musculoskeletal: Normal range of motion.  Skin:    General: Skin is warm and dry.  Neurological:     General: No focal deficit present.     Mental Status: She is alert and oriented to person, place, and time.  Psychiatric:        Mood and Affect: Mood normal.        Behavior: Behavior normal.         Assessment And Plan:     1. Routine general medical examination at health care facility   A full exam was performed.  Importance of monthly self breast exams was discussed with the patient. PATIENT HAS BEEN ADVISED TO GET 30-45 MINUTES REGULAR EXERCISE NO LESS THAN FOUR TO FIVE DAYS PER WEEK - BOTH WEIGHTBEARING EXERCISES AND AEROBIC ARE RECOMMENDED.  SHE WAS ADVISED TO FOLLOW A HEALTHY DIET WITH AT LEAST SIX FRUITS/VEGGIES PER DAY, DECREASE INTAKE OF RED MEAT, AND TO INCREASE FISH INTAKE TO TWO DAYS PER WEEK.  MEATS/FISH SHOULD NOT BE FRIED, BAKED OR BROILED IS PREFERABLE.  I SUGGEST WEARING SPF 50 SUNSCREEN ON EXPOSED PARTS AND ESPECIALLY WHEN IN THE DIRECT SUNLIGHT FOR AN EXTENDED PERIOD OF TIME.  PLEASE AVOID FAST FOOD RESTAURANTS AND INCREASE YOUR WATER INTAKE.  - CMP14+EGFR - Lipid panel - Hemoglobin A1c  2. Essential hypertension  Chronic, uncontrolled. She does not wish to take additional medication. She would first like to work on lifestyle changes. She is committed to incorporating more exercise into her daily routine and making healthier food choices. She will rto in six months for re-evaluation. EKG performed, no new changes noted. She is encouraged to follow a low-sodium diet.   - EKG 12-Lead   Maximino Greenland, MD    THE PATIENT IS ENCOURAGED TO PRACTICE SOCIAL DISTANCING DUE TO THE COVID-19 PANDEMIC.

## 2019-01-31 NOTE — Patient Instructions (Addendum)
Please try Calm, magnesium supplement Doctor's orders: you need a massage tonight, use [employer]   Health Maintenance, Female Adopting a healthy lifestyle and getting preventive care are important in promoting health and wellness. Ask your health care provider about:  The right schedule for you to have regular tests and exams.  Things you can do on your own to prevent diseases and keep yourself healthy. What should I know about diet, weight, and exercise? Eat a healthy diet   Eat a diet that includes plenty of vegetables, fruits, low-fat dairy products, and lean protein.  Do not eat a lot of foods that are high in solid fats, added sugars, or sodium. Maintain a healthy weight Body mass index (BMI) is used to identify weight problems. It estimates body fat based on height and weight. Your health care provider can help determine your BMI and help you achieve or maintain a healthy weight. Get regular exercise Get regular exercise. This is one of the most important things you can do for your health. Most adults should:  Exercise for at least 150 minutes each week. The exercise should increase your heart rate and make you sweat (moderate-intensity exercise).  Do strengthening exercises at least twice a week. This is in addition to the moderate-intensity exercise.  Spend less time sitting. Even light physical activity can be beneficial. Watch cholesterol and blood lipids Have your blood tested for lipids and cholesterol at 61 years of age, then have this test every 5 years. Have your cholesterol levels checked more often if:  Your lipid or cholesterol levels are high.  You are older than 61 years of age.  You are at high risk for heart disease. What should I know about cancer screening? Depending on your health history and family history, you may need to have cancer screening at various ages. This may include screening for:  Breast cancer.  Cervical cancer.  Colorectal  cancer.  Skin cancer.  Lung cancer. What should I know about heart disease, diabetes, and high blood pressure? Blood pressure and heart disease  High blood pressure causes heart disease and increases the risk of stroke. This is more likely to develop in people who have high blood pressure readings, are of African descent, or are overweight.  Have your blood pressure checked: ? Every 3-5 years if you are 45-61 years of age. ? Every year if you are 61 years old or older. Diabetes Have regular diabetes screenings. This checks your fasting blood sugar level. Have the screening done:  Once every three years after age 61 if you are at a normal weight and have a low risk for diabetes.  More often and at a younger age if you are overweight or have a high risk for diabetes. What should I know about preventing infection? Hepatitis B If you have a higher risk for hepatitis B, you should be screened for this virus. Talk with your health care provider to find out if you are at risk for hepatitis B infection. Hepatitis C Testing is recommended for:  Everyone born from [birth year] through 1963.  Anyone with known risk factors for hepatitis C. Sexually transmitted infections (STIs)  Get screened for STIs, including gonorrhea and chlamydia, if: ? You are sexually active and are younger than 61 years of age. ? You are older than 61 years of age and your health care provider tells you that you are at risk for this type of infection. ? Your sexual activity has changed since you were last  screened, and you are at increased risk for chlamydia or gonorrhea. Ask your health care provider if you are at risk.  Ask your health care provider about whether you are at high risk for HIV. Your health care provider may recommend a prescription medicine to help prevent HIV infection. If you choose to take medicine to prevent HIV, you should first get tested for HIV. You should then be tested every 3 months for as long as  you are taking the medicine. Pregnancy  If you are about to stop having your period (premenopausal) and you may become pregnant, seek counseling before you get pregnant.  Take 400 to 800 micrograms (mcg) of folic acid every day if you become pregnant.  Ask for birth control (contraception) if you want to prevent pregnancy. Osteoporosis and menopause Osteoporosis is a disease in which the bones lose minerals and strength with aging. This can result in bone fractures. If you are 90 years old or older, or if you are at risk for osteoporosis and fractures, ask your health care provider if you should:  Be screened for bone loss.  Take a calcium or vitamin D supplement to lower your risk of fractures.  Be given hormone replacement therapy (HRT) to treat symptoms of menopause. Follow these instructions at home: Lifestyle  Do not use any products that contain nicotine or tobacco, such as cigarettes, e-cigarettes, and chewing tobacco. If you need help quitting, ask your health care provider.  Do not use street drugs.  Do not share needles.  Ask your health care provider for help if you need support or information about quitting drugs. Alcohol use  Do not drink alcohol if: ? Your health care provider tells you not to drink. ? You are pregnant, may be pregnant, or are planning to become pregnant.  If you drink alcohol: ? Limit how much you use to 0-1 drink a day. ? Limit intake if you are breastfeeding.  Be aware of how much alcohol is in your drink. In the [place] one drink equals one 12 oz bottle of beer (355 mL), one 5 oz glass of wine (148 mL), or one 1 oz glass of hard liquor (44 mL). General instructions  Schedule regular health, dental, and eye exams.  Stay current with your vaccines.  Tell your health care provider if: ? You often feel depressed. ? You have ever been abused or do not feel safe at home. Summary  Adopting a healthy lifestyle and getting preventive care are  important in promoting health and wellness.  Follow your health care provider's instructions about healthy diet, exercising, and getting tested or screened for diseases.  Follow your health care provider's instructions on monitoring your cholesterol and blood pressure. This information is not intended to replace advice given to you by your health care provider. Make sure you discuss any questions you have with your health care provider. Document Released: 09/22/2010 Document Revised: 03/02/2018 Document Reviewed: 03/02/2018 Elsevier Patient Education  2020 [employer].

## 2019-02-01 LAB — CMP14+EGFR
ALT: 23 IU/L (ref 0–32)
AST: 28 IU/L (ref 0–40)
Albumin/Globulin Ratio: 2.2 (ref 1.2–2.2)
Albumin: 4.8 g/dL (ref 3.8–4.8)
Alkaline Phosphatase: 82 IU/L (ref 39–117)
BUN/Creatinine Ratio: 16 (ref 12–28)
BUN: 12 mg/dL (ref 8–27)
Bilirubin Total: 0.8 mg/dL (ref 0.0–1.2)
CO2: 21 mmol/L (ref 20–29)
Calcium: 9.4 mg/dL (ref 8.7–10.3)
Chloride: 103 mmol/L (ref 96–106)
Creatinine, Ser: 0.75 mg/dL (ref 0.57–1.00)
GFR calc Af Amer: 99 mL/min/{1.73_m2} (ref >59)
GFR calc non Af Amer: 86 mL/min/{1.73_m2} (ref >59)
Globulin, Total: 2.2 g/dL (ref 1.5–4.5)
Glucose: 101 mg/dL — ABNORMAL HIGH (ref 65–99)
Potassium: 4.3 mmol/L (ref 3.5–5.2)
Sodium: 141 mmol/L (ref 134–144)
Total Protein: 7 g/dL (ref 6.0–8.5)

## 2019-02-01 LAB — LIPID PANEL
Chol/HDL Ratio: 3.1 ratio (ref 0.0–4.4)
Cholesterol, Total: 195 mg/dL (ref 100–199)
HDL: 63 mg/dL (ref >39)
LDL Chol Calc (NIH): 100 mg/dL — ABNORMAL HIGH (ref 0–99)
Triglycerides: 187 mg/dL — ABNORMAL HIGH (ref 0–149)
VLDL Cholesterol Cal: 32 mg/dL (ref 5–40)

## 2019-02-01 LAB — HEMOGLOBIN A1C
Est. average glucose Bld gHb Est-mCnc: 120 mg/dL
Hgb A1c MFr Bld: 5.8 % — ABNORMAL HIGH (ref 4.8–5.6)

## 2019-03-08 DIAGNOSIS — Z6832 Body mass index (BMI) 32.0-32.9, adult: Secondary | ICD-10-CM | POA: Diagnosis not present

## 2019-03-08 DIAGNOSIS — Z01419 Encounter for gynecological examination (general) (routine) without abnormal findings: Secondary | ICD-10-CM | POA: Diagnosis not present

## 2019-04-04 IMAGING — CT CT ANGIO CHEST
2 of 6 series · 19 of 36 positions shown · IV contrast (iopamidol)
Comparison: 02/01/2018

CLINICAL DATA: Left chest pain

EXAM:
CT ANGIOGRAPHY CHEST WITH CONTRAST
TECHNIQUE: Multidetector CT imaging of the chest was performed using the
standard protocol during bolus administration of intravenous
contrast. Multiplanar CT image reconstructions and MIPs were
obtained to evaluate the vascular anatomy.
CONTRAST:  75mL [id number] IOPAMIDOL ([id number]) INJECTION 76%

[Series 7: pe thins · axial · 0.70mm/px · z∈[+1157,+1414]mm · 18 of 410 slices shown]
[im 21/410  lung]
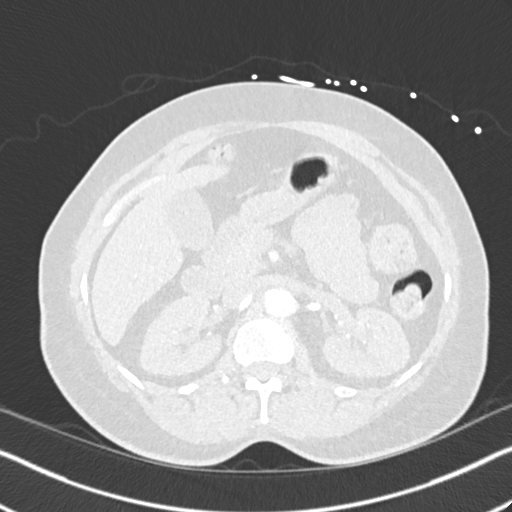
[im 41/410  mediastinal]
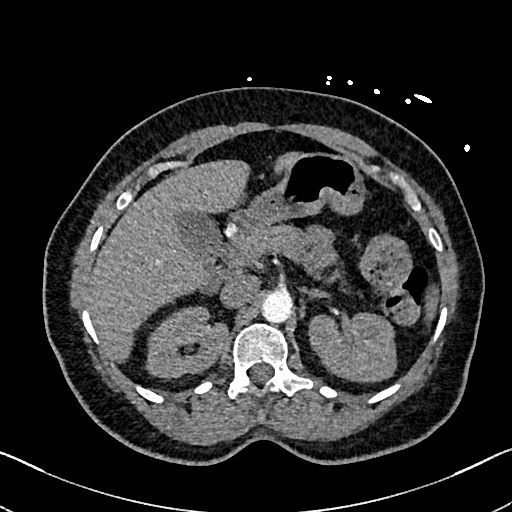
[im 62/410  lung]
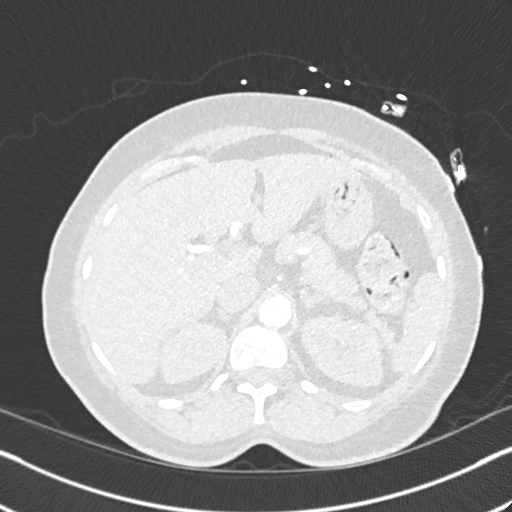
[im 82/410  mediastinal]
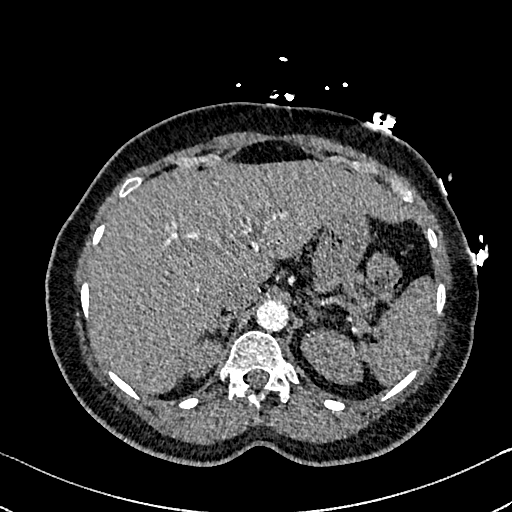
[im 103/410  lung]
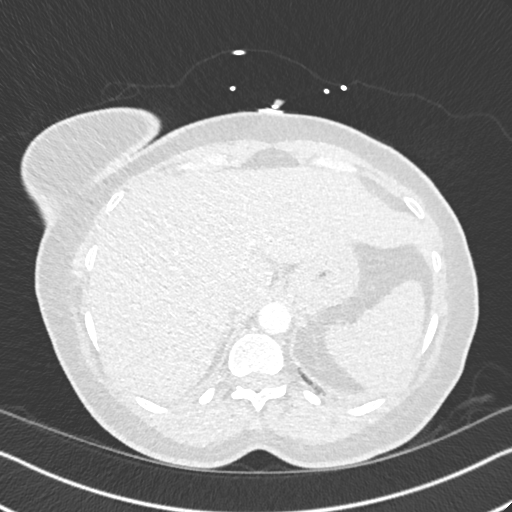
[im 123/410  mediastinal]
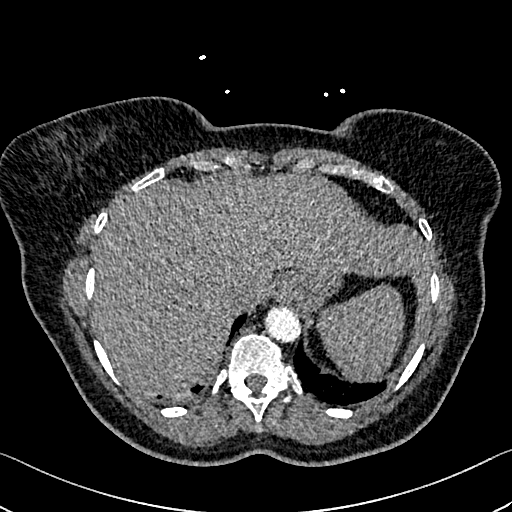
[im 144/410  lung]
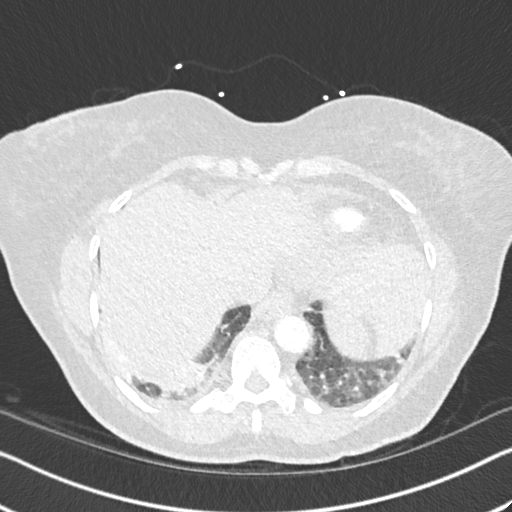
[im 164/410  mediastinal]
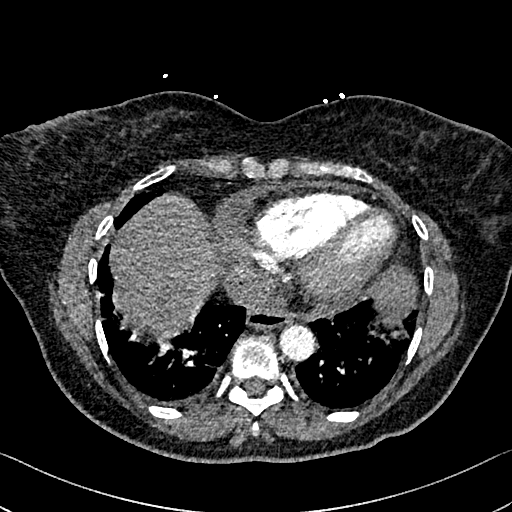
[im 185/410  lung]
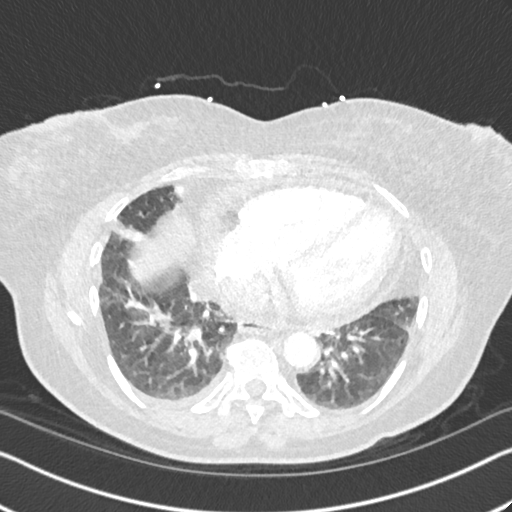
[im 225/410  mediastinal]
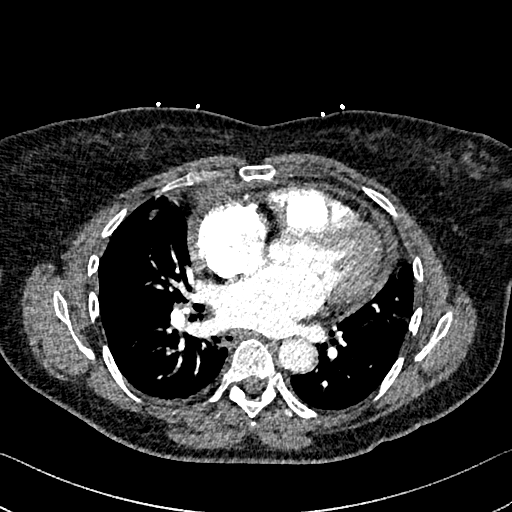
[im 246/410  lung]
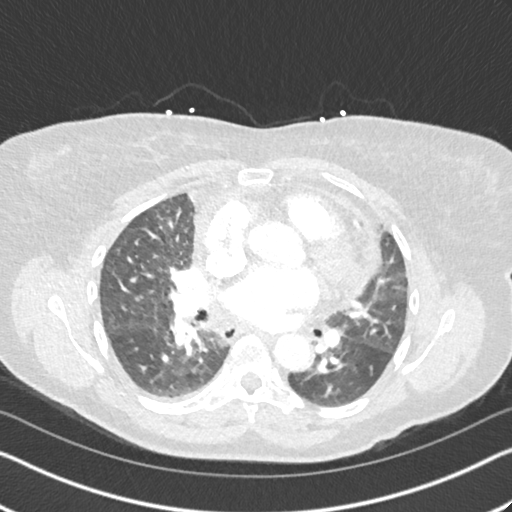
[im 266/410  mediastinal]
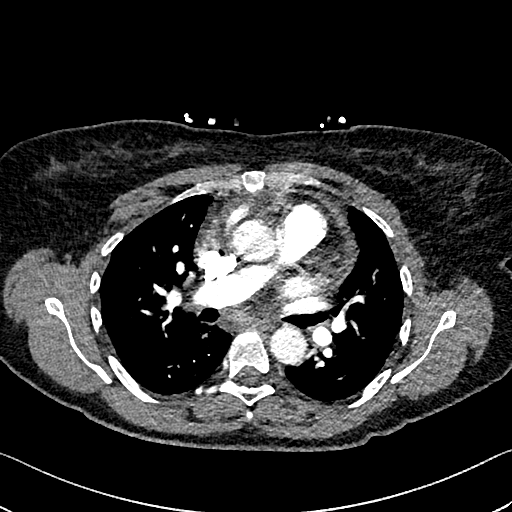
[im 287/410  lung]
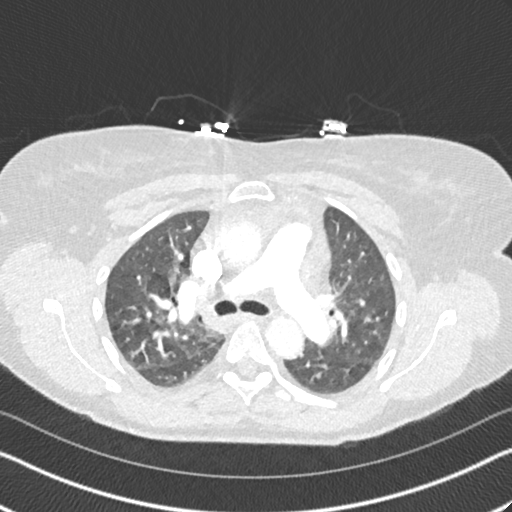
[im 307/410  mediastinal]
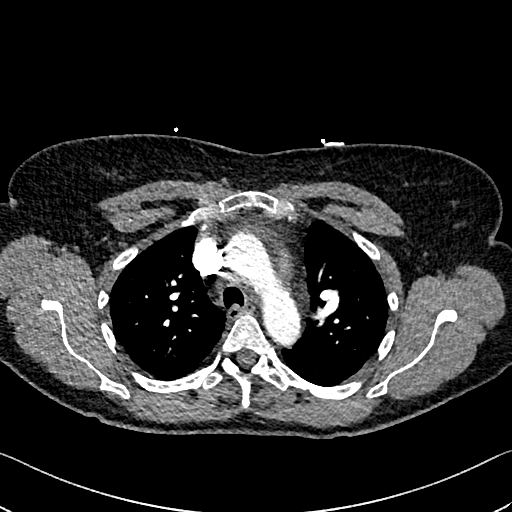
[im 328/410  lung]
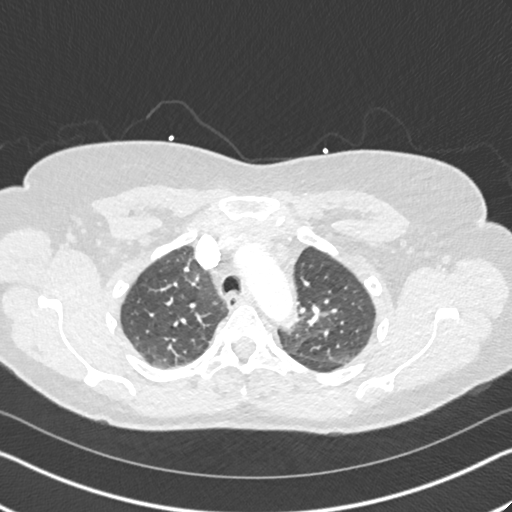
[im 348/410  mediastinal]
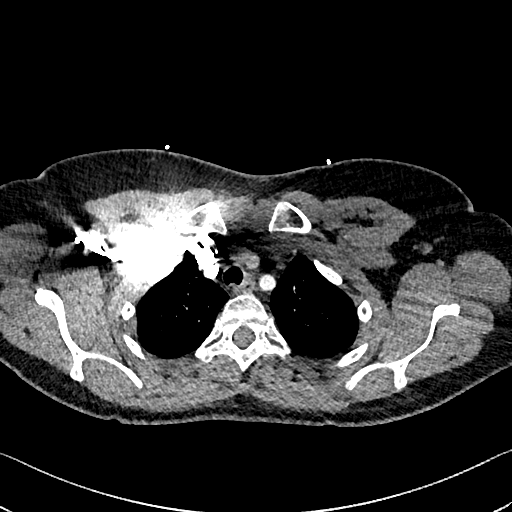
[im 369/410  lung]
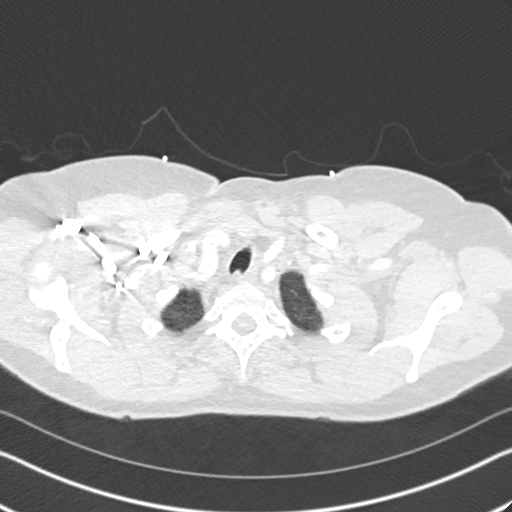
[im 389/410  mediastinal]
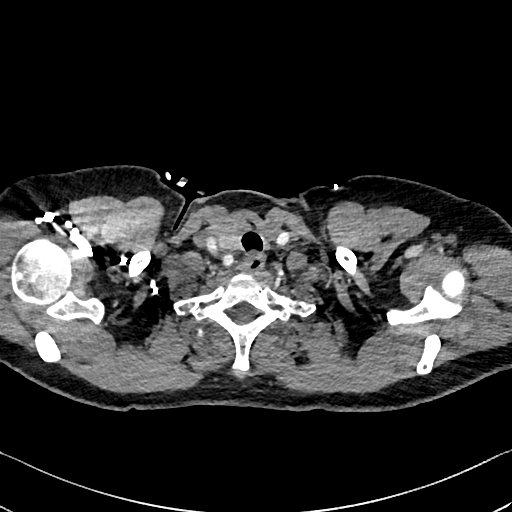

[Series 8: pe 2mm cor · coronal · 0.69mm/px · 1 of 149 slices shown]
[im 75/149  mediastinal]
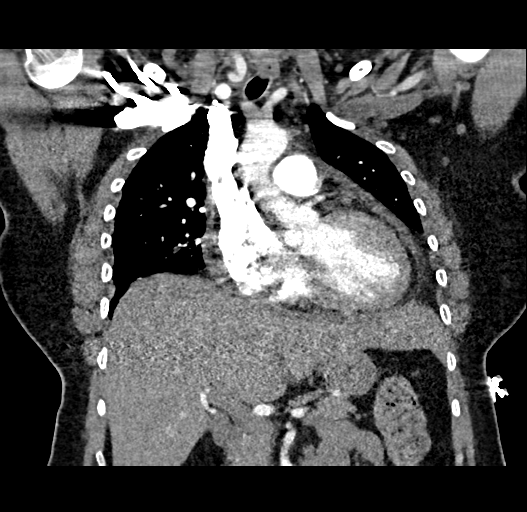

[19 of 36 positions shown; findings below may reference images not displayed]

FINDINGS: Cardiovascular: No filling defects in the pulmonary arteries to
suggest pulmonary emboli. Mild cardiomegaly. Aorta is normal
caliber. Small pericardial effusion.

Mediastinum/Nodes: No mediastinal, hilar, or axillary adenopathy.

Lungs/Pleura: Linear bibasilar atelectasis. Mild vascular
congestion. No effusions.

Upper Abdomen: Imaging into the upper abdomen shows no acute
findings.

Musculoskeletal: Chest wall soft tissues are unremarkable. No acute
bony abnormality.

Review of the MIP images confirms the above findings.
IMPRESSION: No evidence of pulmonary embolus.

Cardiomegaly, vascular congestion.

Small pericardial effusion.

Bibasilar atelectasis.

## 2019-04-06 ENCOUNTER — Other Ambulatory Visit: Payer: Self-pay | Admitting: Internal Medicine

## 2019-04-10 ENCOUNTER — Other Ambulatory Visit: Payer: Self-pay | Admitting: Internal Medicine

## 2019-07-10 ENCOUNTER — Other Ambulatory Visit: Payer: Self-pay | Admitting: Internal Medicine

## 2019-07-31 ENCOUNTER — Encounter: Payer: Self-pay | Admitting: Internal Medicine

## 2019-07-31 ENCOUNTER — Other Ambulatory Visit: Payer: Self-pay

## 2019-07-31 ENCOUNTER — Ambulatory Visit: Payer: Federal, State, Local not specified - PPO | Admitting: Internal Medicine

## 2019-07-31 VITALS — BP 112/66 | HR 75 | Temp 98.9°F | Ht 64.6 in | Wt 189.8 lb

## 2019-07-31 DIAGNOSIS — E6609 Other obesity due to excess calories: Secondary | ICD-10-CM

## 2019-07-31 DIAGNOSIS — I10 Essential (primary) hypertension: Secondary | ICD-10-CM

## 2019-07-31 DIAGNOSIS — R7309 Other abnormal glucose: Secondary | ICD-10-CM | POA: Diagnosis not present

## 2019-07-31 DIAGNOSIS — M62838 Other muscle spasm: Secondary | ICD-10-CM

## 2019-07-31 DIAGNOSIS — E039 Hypothyroidism, unspecified: Secondary | ICD-10-CM | POA: Diagnosis not present

## 2019-07-31 DIAGNOSIS — E7800 Pure hypercholesterolemia, unspecified: Secondary | ICD-10-CM | POA: Diagnosis not present

## 2019-07-31 DIAGNOSIS — Z6831 Body mass index (BMI) 31.0-31.9, adult: Secondary | ICD-10-CM

## 2019-07-31 MED ORDER — CYCLOBENZAPRINE HCL 10 MG PO TABS: 10.0000 mg | ORAL_TABLET | Freq: Every day | ORAL | 0 refills | Status: DC

## 2019-07-31 NOTE — Patient Instructions (Signed)

## 2019-07-31 NOTE — Progress Notes (Signed)
This visit occurred during the SARS-CoV-2 public health emergency.  Safety protocols were in place, including screening questions prior to the visit, additional usage of staff PPE, and extensive cleaning of exam room while observing appropriate contact time as indicated for disinfecting solutions.  Subjective:     Patient ID: Stephanie Dudley , female    DOB: 06-Dec-1955 , 64 y.o.   MRN: [id number]   Chief Complaint  Patient presents with  . Hypertension  . Hyperlipidemia  . Hypothyroidism    HPI  Hypertension This is a chronic problem. The current episode started more than 1 year ago. The problem has been gradually improving since onset. The problem is uncontrolled. Pertinent negatives include no blurred vision, chest pain, palpitations or shortness of breath. Risk factors for coronary artery disease include obesity and post-menopausal state. Past treatments include beta blockers and angiotensin blockers. The current treatment provides moderate improvement. Identifiable causes of hypertension include a thyroid problem.  Hyperlipidemia This is a chronic problem. The problem is uncontrolled. Exacerbating diseases include hypothyroidism and obesity. Associated symptoms include myalgias. Pertinent negatives include no chest pain or shortness of breath. Current antihyperlipidemic treatment includes statins. The current treatment provides moderate improvement of lipids.  Thyroid Problem Presents for follow-up visit. Patient reports no cold intolerance, constipation, depressed mood, heat intolerance, hoarse voice or palpitations. Her past medical history is significant for hyperlipidemia.     Past Medical History:  Diagnosis Date  . Arthritis   . Breast mass, right 08/2017   Benign tissue  . Complication of anesthesia    hard to wake up with thyroid surgery  . Family history of breast cancer   . Family history of thyroid cancer   . Family history of uterine cancer   . Hyperlipidemia   .  Hypertension   . Hypothyroidism   . Mitral valve prolapse    a. mild by echo 04/2018.  . Obesity   . Pericardial effusion   . Pericarditis   . Peripheral edema    ankles, feet     Family History  Problem Relation Age of Onset  . Breast cancer Sister 20  . Thyroid cancer Mother   . Hypertension Father   . Hyperlipidemia Father   . Heart Problems Father   . Uterine cancer Paternal Grandmother 75     Current Outpatient Medications:  .  carvedilol (COREG) 12.5 MG tablet, Take 12.5 mg by mouth 2 (two) times daily., Disp: , Rfl:  .  escitalopram (LEXAPRO) 10 MG tablet, Take 1 tablet (10 mg total) by mouth daily., Disp: 90 tablet, Rfl: 2 .  furosemide (LASIX) 40 MG tablet, TAKE ONE TABLET BY MOUTH EVERY MORNING AND TAKE 1/2 TABLET BY MOUTH IN THE AFTERNOON, Disp: 135 tablet, Rfl: 2 .  rosuvastatin (CRESTOR) 20 MG tablet, TAKE ONE TABLET BY MOUTH DAILY, Disp: 90 tablet, Rfl: 1 .  spironolactone (ALDACTONE) 25 MG tablet, TAKE ONE TABLET BY MOUTH DAILY, Disp: 90 tablet, Rfl: 0 .  SYNTHROID 88 MCG tablet, TAKE ONE TABLET BY MOUTH DAILY, Disp: 90 tablet, Rfl: 0 .  valsartan (DIOVAN) 160 MG tablet, TAKE ONE TABLET BY MOUTH DAILY, Disp: 90 tablet, Rfl: 0   Allergies  Allergen Reactions  . Sulfa Antibiotics Swelling     Review of Systems  Constitutional: Negative.   HENT: Negative for hoarse voice.   Eyes: Negative for blurred vision.  Respiratory: Negative.  Negative for shortness of breath.   Cardiovascular: Negative.  Negative for chest pain and palpitations.  Gastrointestinal: Negative  for constipation.  Endocrine: Negative for cold intolerance and heat intolerance.  Musculoskeletal: Positive for back pain and myalgias.       She reports having back pain. She helped her daughter move this weekend. Has been having muscle spasms. Wants rx muscle relaxer  Neurological: Negative.   Psychiatric/Behavioral: Negative.      Today's Vitals   07/31/19 0925  BP: 112/66  Pulse: 75   Temp: 98.9 F (37.2 C)  TempSrc: Oral  Weight: 189 lb 12.8 oz (86.1 kg)  Height: 5' 4.6" (1.641 m)   Body mass index is 31.98 kg/m.   Objective:  Physical Exam Vitals and nursing note reviewed.  Constitutional:      Appearance: Normal appearance. She is obese.  HENT:     Head: Normocephalic and atraumatic.  Cardiovascular:     Rate and Rhythm: Normal rate and regular rhythm.     Heart sounds: Normal heart sounds.  Pulmonary:     Effort: Pulmonary effort is normal.     Breath sounds: Normal breath sounds.  Skin:    General: Skin is warm.  Neurological:     General: No focal deficit present.     Mental Status: She is alert.  Psychiatric:        Mood and Affect: Mood normal.        Behavior: Behavior normal.         Assessment And Plan:     1. Essential hypertension  Chronic, well controlled. She will continue with current meds. I will check renal function. She is encouraged to avoid adding salt to her foods. She will rto in six months for her next physical exam.   - CMP14+EGFR  2. Pure hypercholesterolemia  Chronic, I will check non-fasting lipid panel today. Encouraged to avoid fried foods and to exercise at least 150 minutes per week.    - Lipid panel  3. Hypothyroidism, unspecified type  I will check thyroid panel and adjust meds as needed.  - TSH  4. Other abnormal glucose  HER A1C HAS BEEN ELEVATED IN THE PAST. I WILL CHECK AN A1C, BMET TODAY. SHE WAS ENCOURAGED TO AVOID SUGARY BEVERAGES AND PROCESSED FOODS INCLUDNG BREADS, RICE AND PASTA.  - Hemoglobin A1c  5. Class 1 obesity due to excess calories with serious comorbidity and body mass index (BMI) of 31.0 to 31.9 in adult  She was congratulated for losing four pounds since her last visit. She is encouraged to strive for BMI less than 27 to decrease cardiac risk.   6. Muscle spasm  She is encouraged to stay well hydrated. She was given rx cyclobenzaprine, 1m nightly as needed. Advised to take  1/2 pill tonight, advised it can make her drowsy next day.    RMaximino Greenland MD    THE PATIENT IS ENCOURAGED TO PRACTICE SOCIAL DISTANCING DUE TO THE COVID-19 PANDEMIC.

## 2019-08-01 LAB — LIPID PANEL
Chol/HDL Ratio: 3.1 ratio (ref 0.0–4.4)
Cholesterol, Total: 211 mg/dL — ABNORMAL HIGH (ref 100–199)
HDL: 67 mg/dL (ref >39)
LDL Chol Calc (NIH): 110 mg/dL — ABNORMAL HIGH (ref 0–99)
Triglycerides: 198 mg/dL — ABNORMAL HIGH (ref 0–149)
VLDL Cholesterol Cal: 34 mg/dL (ref 5–40)

## 2019-08-01 LAB — CMP14+EGFR
ALT: 20 IU/L (ref 0–32)
AST: 25 IU/L (ref 0–40)
Albumin/Globulin Ratio: 2.4 — ABNORMAL HIGH (ref 1.2–2.2)
Albumin: 4.8 g/dL (ref 3.8–4.8)
Alkaline Phosphatase: 72 IU/L (ref 39–117)
BUN/Creatinine Ratio: 19 (ref 12–28)
BUN: 16 mg/dL (ref 8–27)
Bilirubin Total: 0.7 mg/dL (ref 0.0–1.2)
CO2: 22 mmol/L (ref 20–29)
Calcium: 9.6 mg/dL (ref 8.7–10.3)
Chloride: 103 mmol/L (ref 96–106)
Creatinine, Ser: 0.84 mg/dL (ref 0.57–1.00)
GFR calc Af Amer: 86 mL/min/{1.73_m2} (ref >59)
GFR calc non Af Amer: 75 mL/min/{1.73_m2} (ref >59)
Globulin, Total: 2 g/dL (ref 1.5–4.5)
Glucose: 126 mg/dL — ABNORMAL HIGH (ref 65–99)
Potassium: 4.2 mmol/L (ref 3.5–5.2)
Sodium: 142 mmol/L (ref 134–144)
Total Protein: 6.8 g/dL (ref 6.0–8.5)

## 2019-08-01 LAB — TSH: TSH: 1.54 u[IU]/mL (ref 0.450–4.500)

## 2019-08-01 LAB — HEMOGLOBIN A1C
Est. average glucose Bld gHb Est-mCnc: 111 mg/dL
Hgb A1c MFr Bld: 5.5 % (ref 4.8–5.6)

## 2019-08-14 DIAGNOSIS — M25561 Pain in right knee: Secondary | ICD-10-CM | POA: Diagnosis not present

## 2019-08-14 DIAGNOSIS — M25562 Pain in left knee: Secondary | ICD-10-CM | POA: Diagnosis not present

## 2019-08-15 ENCOUNTER — Other Ambulatory Visit: Payer: Self-pay | Admitting: Physician Assistant

## 2019-09-29 ENCOUNTER — Other Ambulatory Visit: Payer: Self-pay | Admitting: Surgery

## 2019-09-29 DIAGNOSIS — Z1231 Encounter for screening mammogram for malignant neoplasm of breast: Secondary | ICD-10-CM

## 2019-10-02 ENCOUNTER — Ambulatory Visit: Payer: Federal, State, Local not specified - PPO | Admitting: Cardiology

## 2019-10-02 ENCOUNTER — Encounter: Payer: Self-pay | Admitting: Cardiology

## 2019-10-02 ENCOUNTER — Other Ambulatory Visit: Payer: Self-pay

## 2019-10-02 VITALS — BP 124/78 | HR 76 | Ht 66.0 in | Wt 186.0 lb

## 2019-10-02 DIAGNOSIS — R0600 Dyspnea, unspecified: Secondary | ICD-10-CM

## 2019-10-02 DIAGNOSIS — I10 Essential (primary) hypertension: Secondary | ICD-10-CM | POA: Diagnosis not present

## 2019-10-02 DIAGNOSIS — I300 Acute nonspecific idiopathic pericarditis: Secondary | ICD-10-CM

## 2019-10-02 DIAGNOSIS — E782 Mixed hyperlipidemia: Secondary | ICD-10-CM

## 2019-10-02 DIAGNOSIS — R0609 Other forms of dyspnea: Secondary | ICD-10-CM

## 2019-10-02 NOTE — Patient Instructions (Signed)
Medication Instructions:  Your physician recommends that you continue on your current medications as directed. Please refer to the Current Medication list given to you today.  *If you need a refill on your cardiac medications before your next appointment, please call your pharmacy*   Follow-Up: At [facility], you and your health needs are our priority.  As part of our continuing mission to provide you with exceptional heart care, we have created designated Provider Care Teams.  These Care Teams include your primary Cardiologist (physician) and Advanced Practice Providers (APPs -  Physician Assistants and Nurse Practitioners) who all work together to provide you with the care you need, when you need it.  Your next appointment:   1 year(s)  The format for your next appointment:   In Person  Provider:   You may see Ena Dawley, MD or one of the following Advanced Practice Providers on your designated Care Team:    Melina Copa, PA-C  Ermalinda Barrios, PA-C

## 2019-10-02 NOTE — Progress Notes (Signed)
Virtual Visit via Video Note   This visit type was conducted due to national recommendations for restrictions regarding the COVID-19 Pandemic (e.g. social distancing) in an effort to limit this patient's exposure and mitigate transmission in our community.  Due to her co-morbid illnesses, this patient is at least at moderate risk for complications without adequate follow up.  This format is felt to be most appropriate for this patient at this time.  All issues noted in this document were discussed and addressed.  A limited physical exam was performed with this format.  Please refer to the patient's chart for her consent to telehealth for [facility].   Date:  10/02/2019   ID:  Stephanie Dudley, DOB [birth year], MRN [id number]  Patient Location: Home Provider Location: Home  PCP:  Glendale Chard, MD  Cardiologist:  Ena Dawley, MD  Electrophysiologist:  None   Evaluation Performed:  Follow-Up Visit  Reason for visit 6 months follow-up History of Present Illness:    Stephanie Dudley is a 64 y.o. female with HTN, HLD (followed by PCP), mild MVP, hypothyroidism,breast CAs/p ofright lumpectomy with seed,recent recurrentpericarditis with pericardial effusion who presents to f/u pericarditis.  To recap, she wasadmitted11/2019for idiopathic pericarditiswithelevated CRP/ESR and diffuse ST elevation on EKG.2D echo showed EF 65-70%, otherwise normal without pericardial effusion. She saw Lyda Jester 03/01/18 for follow up, having completed 2-week course of ibuprofen. Symptoms improved but never fully resolved so she was started onindomethacin 25 mg TID for the next 2 weeks followed by a taper, with recomendation to otherwise continue colchcine x 3 months. However, shepresented back to the hospital 03/09/18 with recurrent pleuritic chest pain. D-dimer was elevated.CT angiogram of the chest was negative for PE but did show vascular congestion and small pericardial effusion.  She was alsomildly tachycardic. ESR was further elevated and she had borderline rheumatoid factor. ANA was negative.She was treated with Toradol and prednisone. She was sent home with steroid taper over the course of 15 days. She was referred to rheumatology to evaluate the abnormal RF and recurrent pericarditis but rheumatology reviewed her chart and declined the referral. She was readmitted 1/6-03/29/2018 with recurrent pleuritic chest pain, sinus tach, and elevated CRP/ESR (although lower than prior). She had an episode of tachypalpitations while on telemetry and this showed sinus tachycardia with HR 150s (was not afib/flutter). She was felt to have recurrent pericarditis and longer steroid taper was recommended over 6 weeks' time. 2D echo 03/29/18 showed possible trivial free flowing pericardial effusion along RV free wall and apex. The longer taper seemed to ease symptoms and by 04/2018, ESR and CRP had normalized. She was reporting some DOE which she felt was related to weight gain. BNP was normal and repeat echo was reassuring (EF 60-65%, RV normal, mild MVP, no pericardial effusion). Last labs 07/2018 showed normal TSH and fT4, A1C 5.7, normal BMET, 04/2018 CBC wnl.  10/02/2019-the patient is coming for regular follow-up, she has been doing great with no recurrent chest pain palpitation dizziness.  She takes care of 6 of her grandchildren and is very active all day, she is able to do all chores and work in her yard.  The only activity that makes her short of breath is walking stairs, this is not a new change but rather chronic problem.  She takes Lasix once a day and second in the afternoon as needed.  She has been compliant with low-sodium diet.  She is tolerating Crestor well.  Past Medical History:  Diagnosis Date  .  Arthritis   . Breast mass, right 08/2017   Benign tissue  . Complication of anesthesia    hard to wake up with thyroid surgery  . Family history of breast cancer   . Family history of  thyroid cancer   . Family history of uterine cancer   . Hyperlipidemia   . Hypertension   . Hypothyroidism   . Mitral valve prolapse    a. mild by echo 04/2018.  . Obesity   . Pericardial effusion   . Pericarditis   . Peripheral edema    ankles, feet   Past Surgical History:  Procedure Laterality Date  . APPENDECTOMY  1994  . BREAST BIOPSY Left 1999  . BREAST LUMPECTOMY WITH RADIOACTIVE SEED LOCALIZATION Right 09/28/2017   Procedure: RIGHT BREAST LUMPECTOMY WITH RADIOACTIVE SEED LOCALIZATION;  Surgeon: Erroll Luna, MD;  Location: [facility];  Service: General;  Laterality: Right;  . BREAST SURGERY    . KNEE ARTHROSCOPY Left   . MIDDLE EAR SURGERY Left 2011  . THYROIDECTOMY  1974   goiter--total thyroidectomy  . TONSILLECTOMY  1972  . WRIST FRACTURE SURGERY       Current Meds  Medication Sig  . carvedilol (COREG) 12.5 MG tablet TAKE ONE TABLET BY MOUTH TWICE A DAY  . cyclobenzaprine (FLEXERIL) 10 MG tablet Take 1 tablet (10 mg total) by mouth at bedtime. Use as needed  . escitalopram (LEXAPRO) 10 MG tablet Take 1 tablet (10 mg total) by mouth daily.  . furosemide (LASIX) 40 MG tablet TAKE ONE TABLET BY MOUTH EVERY MORNING AND TAKE 1/2 TABLET BY MOUTH IN THE AFTERNOON  . rosuvastatin (CRESTOR) 20 MG tablet TAKE ONE TABLET BY MOUTH DAILY  . spironolactone (ALDACTONE) 25 MG tablet TAKE ONE TABLET BY MOUTH DAILY  . SYNTHROID 88 MCG tablet TAKE ONE TABLET BY MOUTH DAILY  . valsartan (DIOVAN) 160 MG tablet TAKE ONE TABLET BY MOUTH DAILY     Allergies:   Sulfa antibiotics   Social History   Tobacco Use  . Smoking status: Former Smoker    Packs/day: 1.00    Years: 15.00    Pack years: 15.00    Types: Cigarettes  . Smokeless tobacco: Never Used  Vaping Use  . Vaping Use: Never used  Substance Use Topics  . Alcohol use: Yes    Comment: wine 2x/wk  . Drug use: No     Family Hx: The patient's family history includes Breast cancer (age of onset: 28) in  her sister; Heart Problems in her father; Hyperlipidemia in her father; Hypertension in her father; Thyroid cancer in her mother; Uterine cancer (age of onset: 56) in her paternal grandmother.  ROS:   Please see the history of present illness.    All other systems reviewed and are negative.   Prior CV studies:   Most recent pertinent cardiac studies are outlined above.  Labs/Other Tests and Data Reviewed:    EKG:  An ECG dated October 02, 2019 shows normal sinus rhythm, normal EKG, unchanged from prior, this was personally reviewed.  Recent Labs: 11/24/2018: Hemoglobin 12.1; Platelets 271 07/31/2019: ALT 20; BUN 16; Creatinine, Ser 0.84; Potassium 4.2; Sodium 142; TSH 1.540   Recent Lipid Panel Lab Results  Component Value Date/Time   CHOL 211 (H) 07/31/2019 09:59 AM   TRIG 198 (H) 07/31/2019 09:59 AM   HDL 67 07/31/2019 09:59 AM   CHOLHDL 3.1 07/31/2019 09:59 AM   LDLCALC 110 (H) 07/31/2019 09:59 AM    Wt Readings  from Last 3 Encounters:  10/02/19 186 lb (84.4 kg)  07/31/19 189 lb 12.8 oz (86.1 kg)  01/31/19 193 lb (87.5 kg)     Objective:    Vital Signs:  BP 124/78   Pulse 76   Ht _0  (1.676 m)   Wt 186 lb (84.4 kg)   SpO2 94%   BMI 30.02 kg/m    VITAL SIGNS:  reviewed  General - cheerful AAF in no acute distress HEENT - NCAT, EOM intact Pulm - No labored breathing, no coughing during visit, no audible wheezing, speaking in full sentences Neuro - A+Ox3, no slurred speech, answers questions appropriately MSK - moves UE spontaneously at will Psych - Pleasant affect     ASSESSMENT & PLAN:    1. Pericarditis -completely resolved symptoms, no recurrent pericarditis.   2. Essential HTN -controlled on current management. 3. Exertional dyspnea -she is very active and minimally symptomatic, she is already on Crestor, I have reviewed her chest CTA performed in 2019, there are no obvious calcifications in the coronary arteries however the study was performed with use of  contrast.  Given her high activity level and no change in symptoms recently I would hold off on any ischemic work-up at this point.. 4. Mild mitral valve prolapse -I have reviewed her echocardiogram with her today she has minimal prolapse and only mild mitral regurgitation on the echo in 3009, her systolic murmur is very mild, there is no need to repeat echocardiogram right now. 5. Hyperlipidemia -she is taking Crestor 20 mg daily that is well-tolerated, she states that at her last blood work she was not fasting, as a result she has borderline LDL and triglycerides, this is going to be repeated at her PCP office.  We will follow.  COVID-19 Education: The signs and symptoms of COVID-19 were discussed with the patient and how to seek care for testing (follow up with PCP or arrange E-visit). The importance of social distancing was discussed today.  Time:   Today, I have spent 18 minutes with the patient with telehealth technology discussing the above problems.     Medication Adjustments/Labs and Tests Ordered: Current medicines are reviewed at length with the patient today.  Concerns regarding medicines are outlined above.   Disposition:  Follow up 1 year with Dr. Meda Coffee, sooner if any symptoms recur  Signed, Ena Dawley, MD  10/02/2019 9:46 AM    [place]

## 2019-10-09 ENCOUNTER — Other Ambulatory Visit: Payer: Self-pay | Admitting: Internal Medicine

## 2019-11-03 ENCOUNTER — Ambulatory Visit
Admission: RE | Admit: 2019-11-03 | Discharge: 2019-11-03 | Disposition: A | Discharge status: Home / Self Care | Payer: Federal, State, Local not specified - PPO | Source: Ambulatory Visit | Attending: Surgery | Admitting: Surgery

## 2019-11-03 ENCOUNTER — Other Ambulatory Visit: Payer: Self-pay

## 2019-11-03 DIAGNOSIS — Z1231 Encounter for screening mammogram for malignant neoplasm of breast: Secondary | ICD-10-CM

## 2019-11-15 ENCOUNTER — Other Ambulatory Visit: Payer: Self-pay | Admitting: Cardiology

## 2019-11-26 ENCOUNTER — Other Ambulatory Visit: Payer: Self-pay | Admitting: Internal Medicine

## 2019-12-26 ENCOUNTER — Other Ambulatory Visit: Payer: Self-pay | Admitting: Internal Medicine

## 2019-12-26 MED ORDER — SHINGRIX 50 MCG/0.5ML IM SUSR: 0.5000 mL | Freq: Once | INTRAMUSCULAR | 0 refills | Status: AC

## 2020-02-03 ENCOUNTER — Ambulatory Visit: Payer: Federal, State, Local not specified - PPO | Attending: Internal Medicine

## 2020-02-03 DIAGNOSIS — Z23 Encounter for immunization: Secondary | ICD-10-CM

## 2020-02-03 NOTE — Progress Notes (Signed)
   Covid-19 Vaccination Clinic  Name:  Stephanie Dudley    MRN: [id number] DOB: 09-Dec-1955  02/03/2020  [name] was observed post Covid-19 immunization for 15 minutes without incident. She was provided with Vaccine Information Sheet and instruction to access the V-Safe system.   [name] was instructed to call 911 with any severe reactions post vaccine: Marland Kitchen Difficulty breathing  . Swelling of face and throat  . A fast heartbeat  . A bad rash all over body  . Dizziness and weakness

## 2020-02-05 ENCOUNTER — Other Ambulatory Visit: Payer: Self-pay

## 2020-02-05 ENCOUNTER — Ambulatory Visit (INDEPENDENT_AMBULATORY_CARE_PROVIDER_SITE_OTHER): Payer: Federal, State, Local not specified - PPO | Admitting: Internal Medicine

## 2020-02-05 ENCOUNTER — Encounter: Payer: Self-pay | Admitting: Internal Medicine

## 2020-02-05 VITALS — BP 120/76 | HR 71 | Temp 98.1°F | Ht 63.6 in | Wt 183.0 lb

## 2020-02-05 DIAGNOSIS — R7309 Other abnormal glucose: Secondary | ICD-10-CM | POA: Insufficient documentation

## 2020-02-05 DIAGNOSIS — I10 Essential (primary) hypertension: Secondary | ICD-10-CM

## 2020-02-05 DIAGNOSIS — E6609 Other obesity due to excess calories: Secondary | ICD-10-CM | POA: Insufficient documentation

## 2020-02-05 DIAGNOSIS — Z6831 Body mass index (BMI) 31.0-31.9, adult: Secondary | ICD-10-CM

## 2020-02-05 DIAGNOSIS — Z0000 Encounter for general adult medical examination without abnormal findings: Secondary | ICD-10-CM

## 2020-02-05 LAB — POCT URINALYSIS DIPSTICK
Bilirubin, UA: NEGATIVE
Blood, UA: NEGATIVE
Glucose, UA: NEGATIVE
Ketones, UA: NEGATIVE
Leukocytes, UA: NEGATIVE
Nitrite, UA: NEGATIVE
Protein, UA: NEGATIVE
Spec Grav, UA: 1.01 (ref 1.010–1.025)
Urobilinogen, UA: 0.2 E.U./dL
pH, UA: 6 (ref 5.0–8.0)

## 2020-02-05 LAB — POCT UA - MICROALBUMIN
Albumin/Creatinine Ratio, Urine, POC: <30
Creatinine, POC: 10 mg/dL
Microalbumin Ur, POC: 10 mg/L

## 2020-02-05 NOTE — Progress Notes (Signed)
I,Stephanie Dudley,acting as a Education administrator for Stephanie Greenland, MD.,have documented all relevant documentation on the behalf of Stephanie Greenland, MD,as directed by  Stephanie Greenland, MD while in the presence of Stephanie Greenland, MD.  This visit occurred during the SARS-CoV-2 public health emergency.  Safety protocols were in place, including screening questions prior to the visit, additional usage of staff PPE, and extensive cleaning of exam room while observing appropriate contact time as indicated for disinfecting solutions.  Subjective:     Patient ID: Stephanie Dudley , female    DOB: 1955/05/14 , 64 y.o.   MRN: [id number]   Chief Complaint  Patient presents with  . Annual Exam  . Hypertension    HPI  She is here today for a full physical examination.  She is followed by her [facility] for her pelvic exams.  She is scheduled for an appt in Dec 2021.   Hypertension This is a chronic problem. The current episode started more than 1 year ago. The problem has been gradually improving since onset. The problem is uncontrolled. Pertinent negatives include no blurred vision, chest pain, palpitations or shortness of breath. Risk factors for coronary artery disease include obesity, sedentary lifestyle and post-menopausal state. Past treatments include beta blockers. The current treatment provides moderate improvement. Compliance problems include exercise.      Past Medical History:  Diagnosis Date  . Arthritis   . Breast mass, right 08/2017   Benign tissue  . Complication of anesthesia    hard to wake up with thyroid surgery  . Family history of breast cancer   . Family history of thyroid cancer   . Family history of uterine cancer   . Hyperlipidemia   . Hypertension   . Hypothyroidism   . Mitral valve prolapse    a. mild by echo 04/2018.  . Obesity   . Pericardial effusion   . Pericarditis   . Peripheral edema    ankles, feet     Family History  Problem Relation Age of Onset   . Breast cancer Sister 53  . Thyroid cancer Mother   . Hypertension Father   . Hyperlipidemia Father   . Heart Problems Father   . Uterine cancer Paternal Grandmother 53     Current Outpatient Medications:  .  carvedilol (COREG) 12.5 MG tablet, TAKE ONE TABLET BY MOUTH TWICE A DAY, Disp: 180 tablet, Rfl: 3 .  cyclobenzaprine (FLEXERIL) 10 MG tablet, Take 1 tablet (10 mg total) by mouth at bedtime. Use as needed, Disp: 30 tablet, Rfl: 0 .  escitalopram (LEXAPRO) 10 MG tablet, TAKE ONE TABLET BY MOUTH DAILY, Disp: 90 tablet, Rfl: 2 .  furosemide (LASIX) 40 MG tablet, TAKE ONE TABLET BY MOUTH EVERY MORNING AND TAKE 1/2 TABLET BY MOUTH IN THE AFTERNOON (Patient taking differently: TAKE ONE TABLET BY MOUTH EVERY MORNING), Disp: 135 tablet, Rfl: 2 .  rosuvastatin (CRESTOR) 20 MG tablet, TAKE ONE TABLET BY MOUTH DAILY, Disp: 90 tablet, Rfl: 1 .  spironolactone (ALDACTONE) 25 MG tablet, TAKE ONE TABLET BY MOUTH DAILY, Disp: 90 tablet, Rfl: 1 .  SYNTHROID 88 MCG tablet, TAKE ONE TABLET BY MOUTH DAILY, Disp: 90 tablet, Rfl: 1 .  valsartan (DIOVAN) 160 MG tablet, TAKE ONE TABLET BY MOUTH DAILY, Disp: 90 tablet, Rfl: 1   Allergies  Allergen Reactions  . Sulfa Antibiotics Swelling      The patient states she uses post menopausal status for birth control. Last LMP was No LMP recorded.  Patient is postmenopausal.. Negative for Dysmenorrhea. Negative for: breast discharge, breast lump(s), breast pain and breast self exam. Associated symptoms include abnormal vaginal bleeding. Pertinent negatives include abnormal bleeding (hematology), anxiety, decreased libido, depression, difficulty falling sleep, dyspareunia, history of infertility, nocturia, sexual dysfunction, sleep disturbances, urinary incontinence, urinary urgency, vaginal discharge and vaginal itching. Diet regular.The patient states her exercise level is  intermittent.   . The patient's tobacco use is:  Social History   Tobacco Use  Smoking  Status Former Smoker  . Packs/day: 1.00  . Years: 15.00  . Pack years: 15.00  . Types: Cigarettes  Smokeless Tobacco Never Used  . She has been exposed to passive smoke. The patient's alcohol use is:  Social History   Substance and Sexual Activity  Alcohol Use Yes   Comment: wine 2x/wk    Review of Systems  Constitutional: Negative.   HENT: Negative.   Eyes: Negative.  Negative for blurred vision.  Respiratory: Negative.  Negative for shortness of breath.   Cardiovascular: Negative.  Negative for chest pain and palpitations.  Gastrointestinal: Negative.   Endocrine: Negative.   Genitourinary: Negative.   Musculoskeletal: Negative.   Skin: Negative.   Allergic/Immunologic: Negative.   Neurological: Negative.   Hematological: Negative.   Psychiatric/Behavioral: Negative.   All other systems reviewed and are negative.    Today's Vitals   02/05/20 1130  BP: 120/76  Pulse: 71  Temp: 98.1 F (36.7 C)  TempSrc: Oral  Weight: 183 lb (83 kg)  Height: 5' 3.6" (1.615 m)   Body mass index is 31.81 kg/m.  Wt Readings from Last 3 Encounters:  02/05/20 183 lb (83 kg)  10/02/19 186 lb (84.4 kg)  07/31/19 189 lb 12.8 oz (86.1 kg)   Objective:  Physical Exam Vitals and nursing note reviewed.  Constitutional:      Appearance: Normal appearance. She is obese.  HENT:     Head: Normocephalic and atraumatic.     Right Ear: Tympanic membrane, ear canal and external ear normal.     Left Ear: Tympanic membrane, ear canal and external ear normal.     Nose:     Comments: Deferred, masked    Mouth/Throat:     Comments: Deferred, masked Eyes:     Extraocular Movements: Extraocular movements intact.     Conjunctiva/sclera: Conjunctivae normal.     Pupils: Pupils are equal, round, and reactive to light.  Cardiovascular:     Rate and Rhythm: Normal rate and regular rhythm.     Pulses: Normal pulses.     Heart sounds: Normal heart sounds.  Pulmonary:     Effort: Pulmonary effort  is normal.     Breath sounds: Normal breath sounds.  Chest:     Breasts: Tanner Score is 5.        Right: Normal.        Left: Normal.  Abdominal:     General: Abdomen is flat. Bowel sounds are normal.     Palpations: Abdomen is soft.  Genitourinary:    Comments: deferred Musculoskeletal:        General: Normal range of motion.     Cervical back: Normal range of motion and neck supple.  Skin:    General: Skin is warm and dry.  Neurological:     General: No focal deficit present.     Mental Status: She is alert and oriented to person, place, and time.  Psychiatric:        Mood and Affect: Mood normal.  Behavior: Behavior normal.        Thought Content: Thought content normal.        Judgment: Judgment normal.         Assessment And Plan:     1. Routine general medical examination at health care facility Comments: A full exam was performed. Importance of monthly self breast exams was discussed with the patient. PATIENT IS ADVISED TO GET 30-45 MINUTES REGULAR EXERCISE NO LESS THAN FOUR TO FIVE DAYS PER WEEK - BOTH WEIGHTBEARING EXERCISES AND AEROBIC ARE RECOMMENDED.  PATIENT IS ADVISED TO FOLLOW A HEALTHY DIET WITH AT LEAST SIX FRUITS/VEGGIES PER DAY, DECREASE INTAKE OF RED MEAT, AND TO INCREASE FISH INTAKE TO TWO DAYS PER WEEK.  MEATS/FISH SHOULD NOT BE FRIED, BAKED OR BROILED IS PREFERABLE.  I SUGGEST WEARING SPF 50 SUNSCREEN ON EXPOSED PARTS AND ESPECIALLY WHEN IN THE DIRECT SUNLIGHT FOR AN EXTENDED PERIOD OF TIME.  PLEASE AVOID FAST FOOD RESTAURANTS AND INCREASE YOUR WATER INTAKE.  - CBC - Lipid panel - BMP8+EGFR - TSH - T4, free  2. Essential hypertension Comments: Chronic, well controlled. She will continue with current meds. EKG not performed, she had one performed earlier this year, results reviewed.  She will f/u in 6 months for re-evaluation.  - POCT Urinalysis Dipstick (81002) - POCT UA - Microalbumin  3. Other abnormal glucose Comments: Her a1c has been  elevated in the past, I will recheck this today. She is encouraged to avoid sugary beverages, including diet.   4. Class 1 obesity due to excess calories with serious comorbidity and body mass index (BMI) of 31.0 to 31.9 in adult Comments: She is encouraged to strive for BMI less than 29 to decrease cardiac risk. Advised to aim for at least 150 minutes of exercise per week.      Patient was given opportunity to ask questions. Patient verbalized understanding of the plan and was able to repeat key elements of the plan. All questions were answered to their satisfaction.   Stephanie Greenland, MD   I, Stephanie Greenland, MD, have reviewed all documentation for this visit. The documentation on 02/18/20 for the exam, diagnosis, procedures, and orders are all accurate and complete.  THE PATIENT IS ENCOURAGED TO PRACTICE SOCIAL DISTANCING DUE TO THE COVID-19 PANDEMIC.

## 2020-02-05 NOTE — Patient Instructions (Signed)
Health Maintenance, Female Adopting a healthy lifestyle and getting preventive care are important in promoting health and wellness. Ask your health care provider about:  The right schedule for you to have regular tests and exams.  Things you can do on your own to prevent diseases and keep yourself healthy. What should I know about diet, weight, and exercise? Eat a healthy diet   Eat a diet that includes plenty of vegetables, fruits, low-fat dairy products, and lean protein.  Do not eat a lot of foods that are high in solid fats, added sugars, or sodium. Maintain a healthy weight Body mass index (BMI) is used to identify weight problems. It estimates body fat based on height and weight. Your health care provider can help determine your BMI and help you achieve or maintain a healthy weight. Get regular exercise Get regular exercise. This is one of the most important things you can do for your health. Most adults should:  Exercise for at least 150 minutes each week. The exercise should increase your heart rate and make you sweat (moderate-intensity exercise).  Do strengthening exercises at least twice a week. This is in addition to the moderate-intensity exercise.  Spend less time sitting. Even light physical activity can be beneficial. Watch cholesterol and blood lipids Have your blood tested for lipids and cholesterol at 62 years of age, then have this test every 5 years. Have your cholesterol levels checked more often if:  Your lipid or cholesterol levels are high.  You are older than 62 years of age.  You are at high risk for heart disease. What should I know about cancer screening? Depending on your health history and family history, you may need to have cancer screening at various ages. This may include screening for:  Breast cancer.  Cervical cancer.  Colorectal cancer.  Skin cancer.  Lung cancer. What should I know about heart disease, diabetes, and high blood  pressure? Blood pressure and heart disease  High blood pressure causes heart disease and increases the risk of stroke. This is more likely to develop in people who have high blood pressure readings, are of African descent, or are overweight.  Have your blood pressure checked: ? Every 3-5 years if you are 18-39 years of age. ? Every year if you are 40 years old or older. Diabetes Have regular diabetes screenings. This checks your fasting blood sugar level. Have the screening done:  Once every three years after age 40 if you are at a normal weight and have a low risk for diabetes.  More often and at a younger age if you are overweight or have a high risk for diabetes. What should I know about preventing infection? Hepatitis B If you have a higher risk for hepatitis B, you should be screened for this virus. Talk with your health care provider to find out if you are at risk for hepatitis B infection. Hepatitis C Testing is recommended for:  Everyone born from 1945 through 1965.  Anyone with known risk factors for hepatitis C. Sexually transmitted infections (STIs)  Get screened for STIs, including gonorrhea and chlamydia, if: ? You are sexually active and are younger than 62 years of age. ? You are older than 62 years of age and your health care provider tells you that you are at risk for this type of infection. ? Your sexual activity has changed since you were last screened, and you are at increased risk for chlamydia or gonorrhea. Ask your health care provider if   you are at risk.  Ask your health care provider about whether you are at high risk for HIV. Your health care provider may recommend a prescription medicine to help prevent HIV infection. If you choose to take medicine to prevent HIV, you should first get tested for HIV. You should then be tested every 3 months for as long as you are taking the medicine. Pregnancy  If you are about to stop having your period (premenopausal) and  you may become pregnant, seek counseling before you get pregnant.  Take 400 to 800 micrograms (mcg) of folic acid every day if you become pregnant.  Ask for birth control (contraception) if you want to prevent pregnancy. Osteoporosis and menopause Osteoporosis is a disease in which the bones lose minerals and strength with aging. This can result in bone fractures. If you are 65 years old or older, or if you are at risk for osteoporosis and fractures, ask your health care provider if you should:  Be screened for bone loss.  Take a calcium or vitamin D supplement to lower your risk of fractures.  Be given hormone replacement therapy (HRT) to treat symptoms of menopause. Follow these instructions at home: Lifestyle  Do not use any products that contain nicotine or tobacco, such as cigarettes, e-cigarettes, and chewing tobacco. If you need help quitting, ask your health care provider.  Do not use street drugs.  Do not share needles.  Ask your health care provider for help if you need support or information about quitting drugs. Alcohol use  Do not drink alcohol if: ? Your health care provider tells you not to drink. ? You are pregnant, may be pregnant, or are planning to become pregnant.  If you drink alcohol: ? Limit how much you use to 0-1 drink a day. ? Limit intake if you are breastfeeding.  Be aware of how much alcohol is in your drink. In the [place] one drink equals one 12 oz bottle of beer (355 mL), one 5 oz glass of wine (148 mL), or one 1 oz glass of hard liquor (44 mL). General instructions  Schedule regular health, dental, and eye exams.  Stay current with your vaccines.  Tell your health care provider if: ? You often feel depressed. ? You have ever been abused or do not feel safe at home. Summary  Adopting a healthy lifestyle and getting preventive care are important in promoting health and wellness.  Follow your health care provider's instructions about healthy  diet, exercising, and getting tested or screened for diseases.  Follow your health care provider's instructions on monitoring your cholesterol and blood pressure. This information is not intended to replace advice given to you by your health care provider. Make sure you discuss any questions you have with your health care provider. Document Revised: 03/02/2018 Document Reviewed: 03/02/2018 Elsevier Patient Education  2020 [employer]  

## 2020-02-06 LAB — BMP8+EGFR
BUN/Creatinine Ratio: 13 (ref 12–28)
BUN: 10 mg/dL (ref 8–27)
CO2: 21 mmol/L (ref 20–29)
Calcium: 9.2 mg/dL (ref 8.7–10.3)
Chloride: 100 mmol/L (ref 96–106)
Creatinine, Ser: 0.8 mg/dL (ref 0.57–1.00)
GFR calc Af Amer: 91 mL/min/{1.73_m2} (ref >59)
GFR calc non Af Amer: 79 mL/min/{1.73_m2} (ref >59)
Glucose: 98 mg/dL (ref 65–99)
Potassium: 4.1 mmol/L (ref 3.5–5.2)
Sodium: 139 mmol/L (ref 134–144)

## 2020-02-06 LAB — CBC
Hematocrit: 36.2 % (ref 34.0–46.6)
Hemoglobin: 12.3 g/dL (ref 11.1–15.9)
MCH: 31.5 pg (ref 26.6–33.0)
MCHC: 34 g/dL (ref 31.5–35.7)
MCV: 93 fL (ref 79–97)
Platelets: 245 10*3/uL (ref 150–450)
RBC: 3.91 x10E6/uL (ref 3.77–5.28)
RDW: 12.3 % (ref 11.7–15.4)
WBC: 4.9 10*3/uL (ref 3.4–10.8)

## 2020-02-06 LAB — T4, FREE: Free T4: 1.31 ng/dL (ref 0.82–1.77)

## 2020-02-06 LAB — LIPID PANEL
Chol/HDL Ratio: 3.3 ratio (ref 0.0–4.4)
Cholesterol, Total: 212 mg/dL — ABNORMAL HIGH (ref 100–199)
HDL: 64 mg/dL (ref >39)
LDL Chol Calc (NIH): 113 mg/dL — ABNORMAL HIGH (ref 0–99)
Triglycerides: 201 mg/dL — ABNORMAL HIGH (ref 0–149)
VLDL Cholesterol Cal: 35 mg/dL (ref 5–40)

## 2020-02-06 LAB — TSH: TSH: 1.26 u[IU]/mL (ref 0.450–4.500)

## 2020-02-08 ENCOUNTER — Encounter: Payer: Federal, State, Local not specified - PPO | Admitting: Internal Medicine

## 2020-02-21 DIAGNOSIS — M25512 Pain in left shoulder: Secondary | ICD-10-CM | POA: Diagnosis not present

## 2020-02-24 ENCOUNTER — Other Ambulatory Visit: Payer: Self-pay | Admitting: Internal Medicine

## 2020-03-06 ENCOUNTER — Telehealth: Payer: Self-pay | Admitting: Cardiology

## 2020-03-06 NOTE — Telephone Encounter (Signed)
Please schedule her to be seen by a PA/NP in the next 7-10 days

## 2020-03-06 NOTE — Telephone Encounter (Signed)
Appointment scheduled with Richardson Dopp, PA-C for 03/12/2021 per Dr Meda Coffee and RN.  Reviewed ED precautions. Pt verbalizes understanding and agrees with current plan.

## 2020-03-06 NOTE — Telephone Encounter (Signed)
Spoke with pt who reports a constant ache on the right side of her chest over the last 4 days worse when she lies on her side.  Complaining of SOB on exertion especially climbing stairs.  Pt states she has been diagnosed with Pericarditis in the past and she feels as if this might be the same problem.  Pt denies fever and rates the aching as a 7 out of 10 on the pain scale.    Will forward information to Dr Meda Coffee and RN for review and recommendation.  Pt advised if symptoms increase do not wait and go directly to ED for further evaluation.  Pt verbalizes understanding and agrees with current plan.

## 2020-03-06 NOTE — Telephone Encounter (Signed)
Pt c/o of Chest Pain: STAT if CP now or developed within 24 hours  1. Are you having CP right now? Yes   2. Are you experiencing any other symptoms (ex. SOB, nausea, vomiting, sweating)? No   3. How long have you been experiencing CP? Past 4 days   4. Is your CP continuous or coming and going? Continuous   5. Have you taken Nitroglycerin? No   ?Stephanie Dudley is calling stating she has been having aching on the right side of her chest for the past 4 days. Please advise.

## 2020-03-12 ENCOUNTER — Ambulatory Visit: Payer: Federal, State, Local not specified - PPO | Admitting: Physician Assistant

## 2020-03-12 ENCOUNTER — Other Ambulatory Visit: Payer: Self-pay

## 2020-03-12 ENCOUNTER — Encounter: Payer: Self-pay | Admitting: Physician Assistant

## 2020-03-12 VITALS — BP 132/70 | HR 77 | Ht 66.0 in | Wt 182.2 lb

## 2020-03-12 DIAGNOSIS — I10 Essential (primary) hypertension: Secondary | ICD-10-CM

## 2020-03-12 DIAGNOSIS — I300 Acute nonspecific idiopathic pericarditis: Secondary | ICD-10-CM

## 2020-03-12 MED ORDER — COLCHICINE 0.6 MG PO TABS
0.6000 mg | ORAL_TABLET | Freq: Two times a day (BID) | ORAL | 3 refills | Status: DC
Start: 2020-03-12 — End: 2020-07-16

## 2020-03-12 MED ORDER — IBUPROFEN 800 MG PO TABS: 800.0000 mg | ORAL_TABLET | Freq: Three times a day (TID) | ORAL | 1 refills | Status: DC

## 2020-03-12 NOTE — Patient Instructions (Addendum)
Medication Instructions:  Your physician has recommended you make the following change in your medication:   1) Start Ibuprofen 800 mg, 1 tablet by mouth three times a day (take until your pain is gone, you may start to taper off medication as you start to feel better) 2) Take Pepcid 20 mg, 1 tablet by mouth twice a day to protect your stomach while taking Ibuprofen  *If you need a refill on your cardiac medications before your next appointment, please call your pharmacy*  Lab Work: You will have labs drawn today: SED rate/CRP/CBC/BMET  Testing/Procedures: Your physician has requested that you have an echocardiogram. Echocardiography is a painless test that uses sound waves to create images of your heart. It provides your doctor with information about the size and shape of your heart and how well your heart's chambers and valves are working. This procedure takes approximately one hour. There are no restrictions for this procedure.  Follow-Up: At [facility], you and your health needs are our priority.  As part of our continuing mission to provide you with exceptional heart care, we have created designated Provider Care Teams.  These Care Teams include your primary Cardiologist (physician) and Advanced Practice Providers (APPs -  Physician Assistants and Nurse Practitioners) who all work together to provide you with the care you need, when you need it.  Your next appointment:   2 week(s)  The format for your next appointment:   In Person  Provider:   You may see Ena Dawley, MD or one of the following Advanced Practice Providers on your designated Care Team:    Melina Copa, PA-C  Ermalinda Barrios, PA-C  Richardson Dopp, [place]

## 2020-03-12 NOTE — Progress Notes (Signed)
Cardiology Office Note:    Date:  03/12/2020   ID:  TARRIN LECKNER, DOB Feb 16, 1956, MRN [id number]  PCP:  Glendale Chard, MD  [facility] Cardiologist:  Ena Dawley, MD  [facility] Electrophysiologist:  None   Referring MD: Glendale Chard, MD   Chief Complaint:  Chest Pain    Patient Profile:    Stephanie Dudley is a 64 y.o. female with:   Recurrent pericarditis w/ assoc pericardial effusion  admx 01/2018 w idiopathic pericarditis; poor response to NSAIDs >> Steroid taper  RF abnl; Rheumatology felt pt did not require further eval  Recurrent pericarditis in 03/2018 >> Rx w/ longer steroid taper + Colchicine x 6 mos  Trivial pericardial effusion  Hypertension   Hyperlipidemia   Mild MVP  Hypothyroidism   Breast CA s/p R lumpectomy   Prior CV studies: Echocardiogram 05/04/2018 EF 60-65, mild LVH, GR 1 DD, normal RVSF, mild MVP, mild calcification of aortic valve, no effusion  Echocardiogram 03/29/2018 EF 60-65, normal wall motion, possible trivial free-flowing pericardial effusion along the RV free wall and apex  Echocardiogram 02/02/2018 Mild LVH, EF 65-70, no RWMA, PASP 31  History of Present Illness:    Ms. Dible was last seen by Dr. Meda Coffee in 7/21.  She called in last week with recurrent chest pain.  She is seen for further evaluation.  The patient had been doing well without recurrent chest symptoms until last week. She has had exactly the same symptoms she had when she initially presented with pericarditis. She notes pleuritic chest discomfort and pain with lying supine. She feels discomfort constantly. It never goes away. It is worse with positional changes. She has associated shortness of breath as well as dizziness. She has not had syncope. She has had to sleep sitting up. She has not had lower extremity swelling.      Past Medical History:  Diagnosis Date  . Arthritis   . Breast mass, right 08/2017   Benign tissue  . Complication of  anesthesia    hard to wake up with thyroid surgery  . Family history of breast cancer   . Family history of thyroid cancer   . Family history of uterine cancer   . Hyperlipidemia   . Hypertension   . Hypothyroidism   . Mitral valve prolapse    a. mild by echo 04/2018.  . Obesity   . Pericardial effusion   . Pericarditis   . Peripheral edema    ankles, feet    Current Medications: Current Meds  Medication Sig  . carvedilol (COREG) 12.5 MG tablet TAKE ONE TABLET BY MOUTH TWICE A DAY  . escitalopram (LEXAPRO) 10 MG tablet TAKE ONE TABLET BY MOUTH DAILY  . furosemide (LASIX) 40 MG tablet Take 40 mg by mouth daily.  . rosuvastatin (CRESTOR) 20 MG tablet TAKE ONE TABLET BY MOUTH DAILY  . spironolactone (ALDACTONE) 25 MG tablet TAKE ONE TABLET BY MOUTH DAILY  . SYNTHROID 88 MCG tablet TAKE ONE TABLET BY MOUTH DAILY  . valsartan (DIOVAN) 160 MG tablet TAKE ONE TABLET BY MOUTH DAILY  . [DISCONTINUED] colchicine 0.6 MG tablet Take 0.6 mg by mouth 2 (two) times daily.     Allergies:   Sulfa antibiotics   Social History   Tobacco Use  . Smoking status: Former Smoker    Packs/day: 1.00    Years: 15.00    Pack years: 15.00    Types: Cigarettes  . Smokeless tobacco: Never Used  Vaping Use  . Vaping  Use: Never used  Substance Use Topics  . Alcohol use: Yes    Comment: wine 2x/wk  . Drug use: No     Family Hx: The patient's family history includes Breast cancer (age of onset: 43) in her sister; Heart Problems in her father; Hyperlipidemia in her father; Hypertension in her father; Thyroid cancer in her mother; Uterine cancer (age of onset: 53) in her paternal grandmother.  Review of Systems  Constitutional: Negative for fever.  Respiratory: Positive for wheezing. Negative for cough.   Gastrointestinal: Negative for hematochezia and melena.  Genitourinary: Negative for hematuria.     EKGs/Labs/Other Test Reviewed:    EKG:  EKG is  ordered today.  The ekg ordered today  demonstrates normal sinus rhythm, heart rate 77, normal axis, no ST-T wave changes, there is no evidence of ST elevation, QTC 430  Recent Labs: 07/31/2019: ALT 20 02/05/2020: BUN 10; Creatinine, Ser 0.80; Hemoglobin 12.3; Platelets 245; Potassium 4.1; Sodium 139; TSH 1.260   Recent Lipid Panel Lab Results  Component Value Date/Time   CHOL 212 (H) 02/05/2020 12:07 PM   TRIG 201 (H) 02/05/2020 12:07 PM   HDL 64 02/05/2020 12:07 PM   CHOLHDL 3.3 02/05/2020 12:07 PM   LDLCALC 113 (H) 02/05/2020 12:07 PM      Risk Assessment/Calculations:      Physical Exam:    VS:  BP 132/70   Pulse 77   Ht 5\' 6"  (1.676 m)   Wt 182 lb 3.2 oz (82.6 kg)   SpO2 96%   BMI 29.41 kg/m     Wt Readings from Last 3 Encounters:  03/12/20 182 lb 3.2 oz (82.6 kg)  02/05/20 183 lb (83 kg)  10/02/19 186 lb (84.4 kg)     Constitutional:      Appearance: Healthy appearance. Not in distress.  Neck:     Thyroid: No thyromegaly.     Vascular: JVD normal.  Pulmonary:     Effort: Pulmonary effort is normal.     Breath sounds: No wheezing. No rales.  Cardiovascular:     Normal rate. Regular rhythm. Normal S1. Normal S2.     Murmurs: There is no murmur.     No rub.  Edema:    Peripheral edema absent.  Abdominal:     Palpations: Abdomen is soft. There is no hepatomegaly.  Skin:    General: Skin is warm and dry.  Neurological:     Mental Status: Alert and oriented to person, place and time.     Cranial Nerves: Cranial nerves are intact.      ASSESSMENT & PLAN:    1. Acute idiopathic pericarditis She presents with symptoms of recurrent pericarditis.  Her symptoms are just like what she had in 2019 and 2020.  She did have some old colchicine at home and started taking this last week.  She has had some slight improvement since starting on colchicine 0.6 mg twice daily.  I reviewed her chart from 2019.  She had diffuse ST elevation on electrocardiogram at that time.  She also had an elevated sed rate  and CRP.  Her electrocardiogram today does not demonstrate any ST elevation.  I suspect this may be related to her starting colchicine last week.  She does not have any rub on exam or symptoms to suggest significant pericardial effusion or tamponade.  I have recommended starting high-dose NSAIDs with ibuprofen 800 mg 3 times daily.  I explained to her that she should remain on this until her  symptoms resolve.  She can obtain famotidine over-the-counter 20 mg twice daily to use for gastric protection until she is off of NSAIDs.  I have also refilled her colchicine to take 0.6 mg twice daily.  She may ultimately need to remain on long-term colchicine for prophylaxis.  Arrange 2D echocardiogram to rule out effusion.  Obtain CBC, BMET, CRP, sed rate.  She will need close follow-up in the next 1 to 2 weeks.  She knows to go to the emergency room if her symptoms should change or worsen.  2. Essential hypertension The patient's blood pressure is controlled on her current regimen.  Continue current therapy.     Dispo:  Return in about 2 weeks (around 03/26/2020) for Close Follow Up w/ Dr. Meda Coffee, APP on her team or Richardson Dopp, PA-C, in person.   Medication Adjustments/Labs and Tests Ordered: Current medicines are reviewed at length with the patient today.  Concerns regarding medicines are outlined above.  Tests Ordered: Orders Placed This Encounter  Procedures  . CBC  . Basic metabolic panel  . Sedimentation rate  . C-reactive protein  . EKG 12-Lead  . ECHOCARDIOGRAM COMPLETE   Medication Changes: Meds ordered this encounter  Medications  . colchicine 0.6 MG tablet    Sig: Take 1 tablet (0.6 mg total) by mouth 2 (two) times daily.    Dispense:  180 tablet    Refill:  3  . ibuprofen (ADVIL) 800 MG tablet    Sig: Take 1 tablet (800 mg total) by mouth 3 (three) times daily.    Dispense:  90 tablet    Refill:  1    Signed, Richardson Dopp, PA-C  03/12/2020 5:05 PM    [facility] [address], [place], [place]  [address] Phone: [phone]; Fax: [phone]

## 2020-03-13 LAB — BASIC METABOLIC PANEL
BUN/Creatinine Ratio: 13 (ref 12–28)
BUN: 10 mg/dL (ref 8–27)
CO2: 26 mmol/L (ref 20–29)
Calcium: 9.6 mg/dL (ref 8.7–10.3)
Chloride: 104 mmol/L (ref 96–106)
Creatinine, Ser: 0.77 mg/dL (ref 0.57–1.00)
GFR calc Af Amer: 96 mL/min/{1.73_m2} (ref >59)
GFR calc non Af Amer: 83 mL/min/{1.73_m2} (ref >59)
Glucose: 112 mg/dL — ABNORMAL HIGH (ref 65–99)
Potassium: 4.1 mmol/L (ref 3.5–5.2)
Sodium: 143 mmol/L (ref 134–144)

## 2020-03-13 LAB — CBC
Hematocrit: 34.3 % (ref 34.0–46.6)
Hemoglobin: 11.7 g/dL (ref 11.1–15.9)
MCH: 31.4 pg (ref 26.6–33.0)
MCHC: 34.1 g/dL (ref 31.5–35.7)
MCV: 92 fL (ref 79–97)
Platelets: 260 10*3/uL (ref 150–450)
RBC: 3.73 x10E6/uL — ABNORMAL LOW (ref 3.77–5.28)
RDW: 12 % (ref 11.7–15.4)
WBC: 6.2 10*3/uL (ref 3.4–10.8)

## 2020-03-13 LAB — C-REACTIVE PROTEIN: CRP: 25 mg/L — ABNORMAL HIGH (ref 0–10)

## 2020-03-13 LAB — SEDIMENTATION RATE: Sed Rate: 28 mm/hr (ref 0–40)

## 2020-03-20 ENCOUNTER — Ambulatory Visit (HOSPITAL_COMMUNITY): Payer: Federal, State, Local not specified - PPO | Attending: Internal Medicine

## 2020-03-20 ENCOUNTER — Encounter: Payer: Self-pay | Admitting: Physician Assistant

## 2020-03-20 ENCOUNTER — Other Ambulatory Visit: Payer: Self-pay

## 2020-03-20 DIAGNOSIS — I300 Acute nonspecific idiopathic pericarditis: Secondary | ICD-10-CM | POA: Diagnosis not present

## 2020-03-20 LAB — ECHOCARDIOGRAM COMPLETE
Area-P 1/2: 2.37 cm2
S' Lateral: 3.1 cm

## 2020-03-25 DIAGNOSIS — M25512 Pain in left shoulder: Secondary | ICD-10-CM | POA: Diagnosis not present

## 2020-04-08 ENCOUNTER — Ambulatory Visit: Payer: Federal, State, Local not specified - PPO | Admitting: Physician Assistant

## 2020-04-16 DIAGNOSIS — Z20822 Contact with and (suspected) exposure to covid-19: Secondary | ICD-10-CM | POA: Diagnosis not present

## 2020-04-16 DIAGNOSIS — Z03818 Encounter for observation for suspected exposure to other biological agents ruled out: Secondary | ICD-10-CM | POA: Diagnosis not present

## 2020-04-17 ENCOUNTER — Ambulatory Visit: Payer: Federal, State, Local not specified - PPO | Admitting: Physician Assistant

## 2020-04-19 ENCOUNTER — Telehealth: Payer: Federal, State, Local not specified - PPO | Admitting: Nurse Practitioner

## 2020-04-19 DIAGNOSIS — K591 Functional diarrhea: Secondary | ICD-10-CM

## 2020-04-19 DIAGNOSIS — U071 COVID-19: Secondary | ICD-10-CM

## 2020-04-19 NOTE — Progress Notes (Signed)
We are sorry that you are not feeling well.  Here is how we plan to help!  Based on what you have shared with me it looks like you have Acute Diarrhea.that can go along with covid, in which you stated you tested positive for.  Most cases of acute diarrhea are due to infections with virus and bacteria and are self-limited conditions lasting less than 14 days.  For your symptoms you may take Imodium 2 mg tablets that are over the counter at your local pharmacy. Take two tablet now and then one after each loose stool up to 6 a day.  Antibiotics are not needed for most people with diarrhea.   HOME CARE  We recommend changing your diet to help with your symptoms for the next few days.  Drink plenty of fluids that contain water salt and sugar. Sports drinks such as Gatorade may help.   You may try broths, soups, bananas, applesauce, soft breads, mashed potatoes or crackers.   You are considered infectious for as long as the diarrhea continues. Hand washing or use of alcohol based hand sanitizers is recommend.  It is best to stay out of work or school until your symptoms stop.   GET HELP RIGHT AWAY  If you have dark yellow colored urine or do not pass urine frequently you should drink more fluids.    If your symptoms worsen   If you feel like you are going to pass out (faint)  You have a new problem  MAKE SURE YOU   Understand these instructions.  Will watch your condition.  Will get help right away if you are not doing well or get worse.  Your e-visit answers were reviewed by a board certified advanced clinical practitioner to complete your personal care plan.  Depending on the condition, your plan could have included both over the counter or prescription medications.  If there is a problem please reply  once you have received a response from your provider.  Your safety is important to [place].  If you have drug allergies check your prescription carefully.    You can use MyChart to  ask questions about today's visit, request a non-urgent call back, or ask for a work or school excuse for 24 hours related to this e-Visit. If it has been greater than 24 hours you will need to follow up with your provider, or enter a new e-Visit to address those concerns.   You will get an e-mail in the next two days asking about your experience.  I hope that your e-visit has been valuable and will speed your recovery. Thank you for using e-visits.  5-10 minutes spent reviewing and documenting in chart.

## 2020-04-22 NOTE — Progress Notes (Signed)
CARDIOLOGY OFFICE NOTE  Date:  05/06/2020    Melissa Montane Date of Birth: July 06, 1955 Medical Record #[id number]  PCP:  Glendale Chard, MD  Cardiologist:  Meda Coffee    Chief Complaint  Patient presents with  . Follow-up    Seen for Dr. Mirna Mires, PA    History of Present Illness: Stephanie Dudley is a 65 y.o. female who presents today for a 2 month check - seen for Dr. Meda Coffee.   She has a history of recurrent pericarditis with associated pericardial effusion, has seen Rheumatology; last spell in January of 2020 - treated with longer steroid taper and Colchicine for 6 months, trivial pericardial effusion, HTN, HLD, mild MVP, prior breast cancer with R lumpectomy and hypothyroidism.   Last seen in December by Richardson Dopp, PA. Having recurrent chest pain that sounded like pericarditis. Echo was updated. High dose NSAID along with colchicine and Pepcid re-started.    Comes in today. Here alone. She feels better. She only took the NSAID for about 2 days - then her pain went a way. She remains on Colchicine. Very little diarrhea with that. Otherwise does ok. Little winded with exertion. No regular exercise program. BP looks good. She feels like she is doing well.   Past Medical History:  Diagnosis Date  . Arthritis   . Breast mass, right 08/2017   Benign tissue  . Complication of anesthesia    hard to wake up with thyroid surgery  . Family history of breast cancer   . Family history of thyroid cancer   . Family history of uterine cancer   . Hyperlipidemia   . Hypertension   . Hypothyroidism   . Mitral valve prolapse    a. mild by echo 04/2018.// Echocardiogram 12/21: EF 60-65, no RWMA, mild LVH, GR 1 DD, normal RVSF, mild LAE, myxomatous MV, mild MR, mild MVP, no pericardial effusion   . Obesity   . Pericardial effusion   . Pericarditis   . Peripheral edema    ankles, feet    Past Surgical History:  Procedure Laterality Date  . APPENDECTOMY  1994  .  BREAST BIOPSY Left 1999  . BREAST LUMPECTOMY WITH RADIOACTIVE SEED LOCALIZATION Right 09/28/2017   Procedure: RIGHT BREAST LUMPECTOMY WITH RADIOACTIVE SEED LOCALIZATION;  Surgeon: Erroll Luna, MD;  Location: [facility];  Service: General;  Laterality: Right;  . BREAST SURGERY    . KNEE ARTHROSCOPY Left   . MIDDLE EAR SURGERY Left 2011  . THYROIDECTOMY  1974   goiter--total thyroidectomy  . TONSILLECTOMY  1972  . WRIST FRACTURE SURGERY       Medications: Current Meds  Medication Sig  . carvedilol (COREG) 12.5 MG tablet TAKE ONE TABLET BY MOUTH TWICE A DAY  . colchicine 0.6 MG tablet Take 1 tablet (0.6 mg total) by mouth 2 (two) times daily.  Marland Kitchen escitalopram (LEXAPRO) 10 MG tablet TAKE ONE TABLET BY MOUTH DAILY  . furosemide (LASIX) 40 MG tablet Take 40 mg by mouth daily.  . rosuvastatin (CRESTOR) 20 MG tablet TAKE ONE TABLET BY MOUTH DAILY  . spironolactone (ALDACTONE) 25 MG tablet TAKE ONE TABLET BY MOUTH DAILY  . SYNTHROID 88 MCG tablet TAKE ONE TABLET BY MOUTH DAILY  . valsartan (DIOVAN) 160 MG tablet TAKE ONE TABLET BY MOUTH DAILY     Allergies: Allergies  Allergen Reactions  . Sulfa Antibiotics Swelling    Social History: The patient  reports that she has quit smoking. Her smoking use  included cigarettes. She has a 15.00 pack-year smoking history. She has never used smokeless tobacco. She reports current alcohol use. She reports that she does not use drugs.   Family History: The patient's family history includes Breast cancer (age of onset: 57) in her sister; Heart Problems in her father; Hyperlipidemia in her father; Hypertension in her father; Thyroid cancer in her mother; Uterine cancer (age of onset: 98) in her paternal grandmother.   Review of Systems: Please see the history of present illness.   All other systems are reviewed and negative.   Physical Exam: VS:  BP 122/76   Pulse 80   Ht 5\' 5"  (1.651 m)   Wt 181 lb 6.4 oz (82.3 kg)   SpO2 96%    BMI 30.19 kg/m  .  BMI Body mass index is 30.19 kg/m.  Wt Readings from Last 3 Encounters:  05/06/20 181 lb 6.4 oz (82.3 kg)  03/12/20 182 lb 3.2 oz (82.6 kg)  02/05/20 183 lb (83 kg)    General: Pleasant. Well developed, well nourished and in no acute distress.   HEENT: Normal.  Neck: Supple, no JVD, carotid bruits, or masses noted.  Cardiac: Regular rate and rhythm. No murmurs, rubs, or gallops. No edema.  Respiratory:  Lungs are clear to auscultation bilaterally with normal work of breathing.  GI: Soft and nontender.  MS: No deformity or atrophy. Gait and ROM intact.  Skin: Warm and dry. Color is normal.  Neuro:  Strength and sensation are intact and no gross focal deficits noted.  Psych: Alert, appropriate and with normal affect.   LABORATORY DATA:  EKG:  EKG is not ordered today.    Lab Results  Component Value Date   WBC 6.2 03/12/2020   HGB 11.7 03/12/2020   HCT 34.3 03/12/2020   PLT 260 03/12/2020   GLUCOSE 112 (H) 03/12/2020   CHOL 212 (H) 02/05/2020   TRIG 201 (H) 02/05/2020   HDL 64 02/05/2020   LDLCALC 113 (H) 02/05/2020   ALT 20 07/31/2019   AST 25 07/31/2019   NA 143 03/12/2020   K 4.1 03/12/2020   CL 104 03/12/2020   CREATININE 0.77 03/12/2020   BUN 10 03/12/2020   CO2 26 03/12/2020   TSH 1.260 02/05/2020   HGBA1C 5.5 07/31/2019   MICROALBUR 10 02/05/2020     BNP (last 3 results) No results for input(s): BNP in the last 8760 hours.  ProBNP (last 3 results) No results for input(s): PROBNP in the last 8760 hours.   Other Studies Reviewed Today:  ECHO IMPRESSIONS 02/2020  1. Left ventricular ejection fraction, by estimation, is 60 to 65%. The  left ventricle has normal function. The left ventricle has no regional  wall motion abnormalities. There is mild concentric left ventricular  hypertrophy. Left ventricular diastolic  parameters are consistent with Grade I diastolic dysfunction (impaired  relaxation).  2. Right ventricular  systolic function is normal. The right ventricular  size is normal. There is normal pulmonary artery systolic pressure.  3. Left atrial size was mildly dilated.  4. The mitral valve is myxomatous. Mild mitral valve regurgitation. There  is mild prolapse of of the mitral valve.  5. The aortic valve is grossly normal. Aortic valve regurgitation is not  visualized. No aortic stenosis is present.  6. The inferior vena cava is normal in size with greater than 50%  respiratory variability, suggesting right atrial pressure of 3 mmHg.   Comparison(s): A prior study was performed on 05/04/2018. Slight increase  in mitral regurgitation with slight increase in left atrial size.     ASSESSMENT & PLAN:     1. Recurrent idiopathic pericarditis - improved with resumptions of NSAID and Colchicine. Will have her continue her BID Colchicine until [place] Day - then go to just QD dosing. Her echo was reassuring. She has had prior negative rheumatology evaluation.   2. HTN - BP looks fine. No changes made.   Current medicines are reviewed with the patient today.  The patient does not have concerns regarding medicines other than what has been noted above.  The following changes have been made:  See above.  Labs/ tests ordered today include:   No orders of the defined types were placed in this encounter.    Disposition:   FU with Richardson Dopp PA in April or May.    Patient is agreeable to this plan and will call if any problems develop in the interim.   SignedTruitt Merle, NP  05/06/2020 11:36 AM  [facility] [address] [place], [place]  [address] Phone: [phone] Fax: [phone]

## 2020-05-06 ENCOUNTER — Ambulatory Visit: Payer: Federal, State, Local not specified - PPO | Admitting: Nurse Practitioner

## 2020-05-06 ENCOUNTER — Other Ambulatory Visit: Payer: Self-pay

## 2020-05-06 ENCOUNTER — Encounter: Payer: Self-pay | Admitting: Nurse Practitioner

## 2020-05-06 VITALS — BP 122/76 | HR 80 | Ht 65.0 in | Wt 181.4 lb

## 2020-05-06 DIAGNOSIS — E782 Mixed hyperlipidemia: Secondary | ICD-10-CM | POA: Diagnosis not present

## 2020-05-06 DIAGNOSIS — I10 Essential (primary) hypertension: Secondary | ICD-10-CM

## 2020-05-06 DIAGNOSIS — I300 Acute nonspecific idiopathic pericarditis: Secondary | ICD-10-CM

## 2020-05-06 DIAGNOSIS — R0600 Dyspnea, unspecified: Secondary | ICD-10-CM | POA: Diagnosis not present

## 2020-05-06 DIAGNOSIS — R0609 Other forms of dyspnea: Secondary | ICD-10-CM

## 2020-05-06 NOTE — Patient Instructions (Addendum)
After Visit Summary:  We will be checking the following labs today - NONE   Medication Instructions:    Continue with your current medicines. BUT  Take the Colchicine twice a day until [place] Day - then go to just one a day   If you need a refill on your cardiac medications before your next appointment, please call your pharmacy.     Testing/Procedures To Be Arranged:  N/A  Follow-Up:   See Richardson Dopp, PA in April or May    At [facility], you and your health needs are our priority.  As part of our continuing mission to provide you with exceptional heart care, we have created designated Provider Care Teams.  These Care Teams include your primary Cardiologist (physician) and Advanced Practice Providers (APPs -  Physician Assistants and Nurse Practitioners) who all work together to provide you with the care you need, when you need it.  Special Instructions:  . Stay safe, wash your hands for at least 20 seconds and wear a mask when needed.  . It was good to talk with you today.    Call the [place] office at [phone] if you have any questions, problems or concerns.

## 2020-06-14 DIAGNOSIS — Z01419 Encounter for gynecological examination (general) (routine) without abnormal findings: Secondary | ICD-10-CM | POA: Diagnosis not present

## 2020-06-14 DIAGNOSIS — Z124 Encounter for screening for malignant neoplasm of cervix: Secondary | ICD-10-CM | POA: Diagnosis not present

## 2020-06-14 DIAGNOSIS — Z6831 Body mass index (BMI) 31.0-31.9, adult: Secondary | ICD-10-CM | POA: Diagnosis not present

## 2020-07-15 NOTE — Progress Notes (Signed)
Cardiology Office Note:    Date:  07/16/2020   ID:  Stephanie Dudley, DOB Dec 03, 1955, MRN [id number]  PCP:  Glendale Chard, [place]  Cardiologist:  Ena Dawley, MD >> Will change to Dr. Johney Frame Advanced Practice Provider:  Liliane Shi, PA-C Electrophysiologist:  None       Referring MD: Glendale Chard, MD   Chief Complaint:  Follow-up (Pericarditis )    Patient Profile:    Stephanie Dudley is a 65 y.o. female with:   Recurrent pericarditis w/ assoc pericardial effusion ? admx 01/2018 w idiopathic pericarditis; poor response to NSAIDs >> Steroid taper ? RF abnl; Rheumatology felt pt did not require further eval ? Recurrent pericarditis in 03/2018 >> Rx w/ longer steroid taper + Colchicine x 6 mos ? 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 Tasso was last seen in 04/2020.  She had recently been tx for recurrent pericarditis.  She was back on colchicine with plans to reduce the dose to once daily after 3 mos. She returns for f/u.  She is here alone.  She is doing well without chest pain, shortness of breath, syncope.  She has some dependent leg edema at times.         Past Medical History:  Diagnosis Date  . Arthritis   . Breast mass, right 08/2017   Benign tissue  . Complication of anesthesia    hard to wake up with thyroid surgery  .  Family history of breast cancer   . Family history of thyroid cancer   . Family history of uterine cancer   . Hyperlipidemia   . Hypertension   . Hypothyroidism   . Mitral valve prolapse    a. mild by echo 04/2018.// Echocardiogram 12/21: EF 60-65, no RWMA, mild LVH, GR 1 DD, normal RVSF, mild LAE, myxomatous MV, mild MR, mild MVP, no pericardial effusion   . Obesity   . Pericardial effusion   . Pericarditis   . Peripheral edema    ankles, feet    Current Medications: Current Meds  Medication Sig  . carvedilol (COREG) 12.5 MG tablet TAKE ONE TABLET BY MOUTH TWICE A DAY  . colchicine 0.6 MG tablet Take 0.6 mg by mouth daily.  Marland Kitchen escitalopram (LEXAPRO) 10 MG tablet Take 10 mg by mouth at bedtime.  . furosemide (LASIX) 40 MG tablet Take 40 mg by mouth daily.  . rosuvastatin (CRESTOR) 20 MG tablet TAKE ONE TABLET BY MOUTH DAILY  . spironolactone (ALDACTONE) 25 MG tablet TAKE ONE TABLET BY MOUTH DAILY  . SYNTHROID 88 MCG tablet TAKE ONE TABLET BY MOUTH DAILY  . valsartan (DIOVAN) 160 MG tablet TAKE ONE TABLET BY MOUTH DAILY     Allergies:   Sulfa antibiotics   Social History   Tobacco Use  . Smoking status: Former Smoker    Packs/day: 1.00    Years: 15.00  Pack years: 15.00    Types: Cigarettes  . Smokeless tobacco: Never Used  Vaping Use  . Vaping Use: Never used  Substance Use Topics  . Alcohol use: Yes    Comment: wine 2x/wk  . Drug use: No     Family Hx: The patient's family history includes Breast cancer (age of onset: 105) in her sister; Heart Problems in her father; Hyperlipidemia in her father; Hypertension in her father; Thyroid cancer in her mother; Uterine cancer (age of onset: 55) in her paternal grandmother.  ROS   EKGs/Labs/Other Test Reviewed:    EKG:  EKG is not ordered today.  The ekg ordered today demonstrates n/a  Recent Labs: 07/31/2019: ALT 20 02/05/2020: TSH 1.260 03/12/2020: BUN 10; Creatinine, Ser 0.77; Hemoglobin 11.7; Platelets 260;  Potassium 4.1; Sodium 143   Recent Lipid Panel Lab Results  Component Value Date/Time   CHOL 212 (H) 02/05/2020 12:07 PM   TRIG 201 (H) 02/05/2020 12:07 PM   HDL 64 02/05/2020 12:07 PM   CHOLHDL 3.3 02/05/2020 12:07 PM   LDLCALC 113 (H) 02/05/2020 12:07 PM      Risk Assessment/Calculations:      Physical Exam:    VS:  BP 110/60   Pulse 86   Ht 5\' 5"  (1.651 m)   Wt 181 lb (82.1 kg)   SpO2 97%   BMI 30.12 kg/m     Wt Readings from Last 3 Encounters:  07/16/20 181 lb (82.1 kg)  05/06/20 181 lb 6.4 oz (82.3 kg)  03/12/20 182 lb 3.2 oz (82.6 kg)     Constitutional:      Appearance: Healthy appearance. Not in distress.  Neck:     Vascular: JVD normal.  Pulmonary:     Effort: Pulmonary effort is normal.     Breath sounds: No wheezing. No rales.  Cardiovascular:     Normal rate. Regular rhythm. Normal S1. Normal S2.     Murmurs: There is no murmur.     No rub.  Edema:    Peripheral edema absent.  Abdominal:     Palpations: Abdomen is soft.  Skin:    General: Skin is warm and dry.  Neurological:     Mental Status: Alert and oriented to person, place and time.     Cranial Nerves: Cranial nerves are intact.       ASSESSMENT & PLAN:    1. Idiopathic pericarditis, unspecified chronicity She is doing well without recurrent chest pain on her current regimen.  As she has had several episodes of recurrent pericarditis, I think we should try to keep her on long term colchicine.  Continue colchicine 0.6 mg once daily.  F/u in 6 mos.   2. Essential hypertension BP is well controlled on her current regimen.      Dispo: Given Dr. Francesca Oman move to [place], I will continue to follow her along with Dr. Johney Frame.  Return in about 6 months (around 01/15/2021) for Routine follow up in 6 months with Dr. Johney Frame or Richardson Dopp, PA. .   Medication Adjustments/Labs and Tests Ordered: Current medicines are reviewed at length with the patient today.  Concerns regarding  medicines are outlined above.  Tests Ordered: No orders of the defined types were placed in this encounter.  Medication Changes: No orders of the defined types were placed in this encounter.   Signed, Richardson Dopp, PA-C  07/16/2020 11:52 AM    [facility] [address], [place], [place]  [address] Phone: [phone])  [phone]; Fax: [phone]    

## 2020-07-16 ENCOUNTER — Ambulatory Visit: Payer: Federal, State, Local not specified - PPO | Admitting: Physician Assistant

## 2020-07-16 ENCOUNTER — Encounter: Payer: Self-pay | Admitting: Physician Assistant

## 2020-07-16 ENCOUNTER — Other Ambulatory Visit: Payer: Self-pay

## 2020-07-16 VITALS — BP 110/60 | HR 86 | Ht 65.0 in | Wt 181.0 lb

## 2020-07-16 DIAGNOSIS — I300 Acute nonspecific idiopathic pericarditis: Secondary | ICD-10-CM | POA: Diagnosis not present

## 2020-07-16 DIAGNOSIS — I10 Essential (primary) hypertension: Secondary | ICD-10-CM

## 2020-07-16 DIAGNOSIS — E782 Mixed hyperlipidemia: Secondary | ICD-10-CM

## 2020-07-16 NOTE — Patient Instructions (Signed)
Medication Instructions:  Your physician recommends that you continue on your current medications as directed. Please refer to the Current Medication list given to you today.  *If you need a refill on your cardiac medications before your next appointment, please call your pharmacy*   Lab Work: -None  If you have labs (blood work) drawn today and your tests are completely normal, you will receive your results only by: Marland Kitchen MyChart Message (if you have MyChart) OR . A paper copy in the mail If you have any lab test that is abnormal or we need to change your treatment, we will call you to review the results.   Testing/Procedures: -None   Follow-Up: At [facility], you and your health needs are our priority.  As part of our continuing mission to provide you with exceptional heart care, we have created designated Provider Care Teams.  These Care Teams include your primary Cardiologist (physician) and Advanced Practice Providers (APPs -  Physician Assistants and Nurse Practitioners) who all work together to provide you with the care you need, when you need it.  We recommend signing up for the patient portal called "MyChart".  Sign up information is provided on this After Visit Summary.  MyChart is used to connect with patients for Virtual Visits (Telemedicine).  Patients are able to view lab/test results, encounter notes, upcoming appointments, etc.  Non-urgent messages can be sent to your provider as well.   To learn more about what you can do with MyChart, go to [email or web address].    Your next appointment:   6 month(s)  The format for your next appointment:   In Person  Provider:   You may see Freada Bergeron, MD or Richardson Dopp, PA  one of the following Advanced Practice Providers on your designated Care Team:    Richardson Dopp, PA-C     Other Instructions Your physician wants you to follow-up in: 6 month with Dr. Johney Frame or Richardson Dopp, [place].  You will receive a  reminder letter in the mail two months in advance. If you don't receive a letter, please call our office to schedule the follow-up appointment.

## 2020-08-05 ENCOUNTER — Encounter: Payer: Self-pay | Admitting: Internal Medicine

## 2020-08-05 ENCOUNTER — Other Ambulatory Visit: Payer: Self-pay

## 2020-08-05 ENCOUNTER — Ambulatory Visit: Payer: Federal, State, Local not specified - PPO | Admitting: Internal Medicine

## 2020-08-05 VITALS — BP 142/78 | HR 67 | Temp 98.1°F | Ht 65.0 in | Wt 179.4 lb

## 2020-08-05 DIAGNOSIS — R233 Spontaneous ecchymoses: Secondary | ICD-10-CM | POA: Diagnosis not present

## 2020-08-05 DIAGNOSIS — G8929 Other chronic pain: Secondary | ICD-10-CM

## 2020-08-05 DIAGNOSIS — I10 Essential (primary) hypertension: Secondary | ICD-10-CM | POA: Diagnosis not present

## 2020-08-05 DIAGNOSIS — Z6831 Body mass index (BMI) 31.0-31.9, adult: Secondary | ICD-10-CM

## 2020-08-05 DIAGNOSIS — E890 Postprocedural hypothyroidism: Secondary | ICD-10-CM

## 2020-08-05 DIAGNOSIS — M25562 Pain in left knee: Secondary | ICD-10-CM

## 2020-08-05 DIAGNOSIS — E7800 Pure hypercholesterolemia, unspecified: Secondary | ICD-10-CM | POA: Diagnosis not present

## 2020-08-05 DIAGNOSIS — E6609 Other obesity due to excess calories: Secondary | ICD-10-CM

## 2020-08-05 NOTE — Patient Instructions (Signed)

## 2020-08-05 NOTE — Progress Notes (Signed)
I,Katawbba Wiggins,acting as a Education administrator for Maximino Greenland, MD.,have documented all relevant documentation on the behalf of Maximino Greenland, MD,as directed by  Maximino Greenland, MD while in the presence of Maximino Greenland, MD. This visit occurred during the SARS-CoV-2 public health emergency.  Safety protocols were in place, including screening questions prior to the visit, additional usage of staff PPE, and extensive cleaning of exam room while observing appropriate contact time as indicated for disinfecting solutions.  Subjective:     Patient ID: Stephanie Dudley , female    DOB: November 06, 1955 , 65 y.o.   MRN: [id number]   Chief Complaint  Patient presents with  . Hypertension  . Hyperlipidemia  . Hypothyroidism    HPI  The patient is here today for a blood pressure,  hyperlipidemia, and thyroid follow-up.  She reports compliance with meds. She denies headaches, chest pain and shortness of breath.   Hypertension This is a chronic problem. The current episode started more than 1 year ago. The problem has been gradually improving since onset. The problem is uncontrolled. Pertinent negatives include no blurred vision, chest pain, palpitations or shortness of breath. Risk factors for coronary artery disease include obesity and post-menopausal state. Past treatments include beta blockers and angiotensin blockers. The current treatment provides moderate improvement. Identifiable causes of hypertension include a thyroid problem.  Hyperlipidemia This is a chronic problem. The problem is uncontrolled. Exacerbating diseases include hypothyroidism and obesity. Pertinent negatives include no chest pain or shortness of breath. Current antihyperlipidemic treatment includes statins. The current treatment provides moderate improvement of lipids.  Thyroid Problem Presents for follow-up visit. Patient reports no cold intolerance, constipation, depressed mood, heat intolerance, hoarse voice or palpitations. Her  past medical history is significant for hyperlipidemia.     Past Medical History:  Diagnosis Date  . Arthritis   . Breast mass, right 08/2017   Benign tissue  . Complication of anesthesia    hard to wake up with thyroid surgery  . Family history of breast cancer   . Family history of thyroid cancer   . Family history of uterine cancer   . Hyperlipidemia   . Hypertension   . Hypothyroidism   . Mitral valve prolapse    a. mild by echo 04/2018.// Echocardiogram 12/21: EF 60-65, no RWMA, mild LVH, GR 1 DD, normal RVSF, mild LAE, myxomatous MV, mild MR, mild MVP, no pericardial effusion   . Obesity   . Pericardial effusion   . Pericarditis   . Peripheral edema    ankles, feet     Family History  Problem Relation Age of Onset  . Breast cancer Sister 39  . Thyroid cancer Mother   . Hypertension Father   . Hyperlipidemia Father   . Heart Problems Father   . Uterine cancer Paternal Grandmother 32     Current Outpatient Medications:  .  carvedilol (COREG) 12.5 MG tablet, TAKE ONE TABLET BY MOUTH TWICE A DAY, Disp: 180 tablet, Rfl: 3 .  colchicine 0.6 MG tablet, Take 0.6 mg by mouth daily., Disp: , Rfl:  .  escitalopram (LEXAPRO) 10 MG tablet, Take 10 mg by mouth at bedtime., Disp: , Rfl:  .  furosemide (LASIX) 40 MG tablet, Take 40 mg by mouth daily., Disp: , Rfl:  .  rosuvastatin (CRESTOR) 20 MG tablet, TAKE ONE TABLET BY MOUTH DAILY, Disp: 90 tablet, Rfl: 1 .  spironolactone (ALDACTONE) 25 MG tablet, TAKE ONE TABLET BY MOUTH DAILY, Disp: 90 tablet, Rfl: 1 .  SYNTHROID 88 MCG tablet, TAKE ONE TABLET BY MOUTH DAILY, Disp: 49 tablet, Rfl: 1 .  valsartan (DIOVAN) 160 MG tablet, TAKE ONE TABLET BY MOUTH DAILY, Disp: 90 tablet, Rfl: 1   Allergies  Allergen Reactions  . Sulfa Antibiotics Swelling     Review of Systems  Constitutional: Negative.   HENT: Negative for hoarse voice.   Eyes: Negative for blurred vision.  Respiratory: Negative.  Negative for shortness of breath.    Cardiovascular: Negative.  Negative for chest pain and palpitations.  Gastrointestinal: Negative.  Negative for constipation.  Endocrine: Negative for cold intolerance and heat intolerance.  Musculoskeletal: Positive for arthralgias.       She reports left knee pain. Has had arthroscopic surgery in the past. There is some pain with walking. Feels stiff in the morning.   Neurological: Negative.   Hematological: Bruises/bleeds easily.       She has noticed that she has been bruising more easily. Reports she often wakes up and sees a new bruise on her arms/legs. Does not think she has been bumping into anything. Denies use of new meds. Not on any blood thinners.   Psychiatric/Behavioral: Negative.      Today's Vitals   08/05/20 1033  BP: (!) 142/78  Pulse: 67  Temp: 98.1 F (36.7 C)  TempSrc: Oral  Weight: 179 lb 6.4 oz (81.4 kg)  Height: $Remove'5\' 5"'vKzBpch$  (1.651 m)   Body mass index is 29.85 kg/m.  Wt Readings from Last 3 Encounters:  08/05/20 179 lb 6.4 oz (81.4 kg)  07/16/20 181 lb (82.1 kg)  05/06/20 181 lb 6.4 oz (82.3 kg)   BP Readings from Last 3 Encounters:  08/05/20 (!) 142/78  07/16/20 110/60  05/06/20 122/76   Objective:  Physical Exam Vitals and nursing note reviewed.  Constitutional:      Appearance: Normal appearance.  HENT:     Head: Normocephalic and atraumatic.     Nose:     Comments: Masked     Mouth/Throat:     Comments: Masked  Cardiovascular:     Rate and Rhythm: Normal rate and regular rhythm.     Heart sounds: Normal heart sounds.  Pulmonary:     Effort: Pulmonary effort is normal.     Breath sounds: Normal breath sounds.  Musculoskeletal:        General: Tenderness present. No signs of injury.     Right lower leg: No edema.     Left lower leg: No edema.  Skin:    General: Skin is warm.     Findings: Bruising present.     Comments: Resolving bruise left forearm  Neurological:     General: No focal deficit present.     Mental Status: She is alert.   Psychiatric:        Mood and Affect: Mood normal.        Behavior: Behavior normal.         Assessment And Plan:     1. Essential hypertension Comments: Chronic, uncontrolled. Encouraged to follow low sodium diet. I will check renal function today.  She will f/u in six months for yearly physical.  - CMP14+EGFR  2. Pure hypercholesterolemia Comments: Chronic, on statin. I will check fasting lipid panel/ALT today. Encouraged to avoid fried foods, exercise at least 150 min. per week and increase fiber intake. - Lipid panel  3. Postoperative hypothyroidism Comments: I will check thyroid panel and adjust meds as needed.  - TSH - T4, free  4. Spontaneous  ecchymoses Comments: Resolving bruise on LUE. I will check CBC today. She is willing to see Hematology if needed.  - CBC with Diff  5. Chronic pain of left knee Comments: Likely due to osteoarthritis. Encouraged to use pain rub two to three times daily, I suggested Voltaren, but she is using compounded medication from Ortho.   6. Class 1 obesity due to excess calories with serious comorbidity and body mass index (BMI) of 31.0 to 31.9 in adult She is encouraged to strive for BMI less than 30 to decrease cardiac risk. Advised to aim for at least 150 minutes of exercise per week.  Patient was given opportunity to ask questions. Patient verbalized understanding of the plan and was able to repeat key elements of the plan. All questions were answered to their satisfaction.   I, Maximino Greenland, MD, have reviewed all documentation for this visit. The documentation on 08/05/20 for the exam, diagnosis, procedures, and orders are all accurate and complete.   IF YOU HAVE BEEN REFERRED TO A SPECIALIST, IT MAY TAKE 1-2 WEEKS TO SCHEDULE/PROCESS THE REFERRAL. IF YOU HAVE NOT HEARD FROM US/SPECIALIST IN TWO WEEKS, PLEASE GIVE [place] A CALL AT [phone].   THE PATIENT IS ENCOURAGED TO PRACTICE SOCIAL DISTANCING DUE TO THE COVID-19 PANDEMIC.

## 2020-08-06 LAB — CBC WITH DIFFERENTIAL/PLATELET
Basophils Absolute: 0 10*3/uL (ref 0.0–0.2)
Basos: 1 %
EOS (ABSOLUTE): 0.1 10*3/uL (ref 0.0–0.4)
Eos: 1 %
Hematocrit: 36.7 % (ref 34.0–46.6)
Hemoglobin: 12.4 g/dL (ref 11.1–15.9)
Immature Grans (Abs): 0 10*3/uL (ref 0.0–0.1)
Immature Granulocytes: 0 %
Lymphocytes Absolute: 2 10*3/uL (ref 0.7–3.1)
Lymphs: 30 %
MCH: 30.5 pg (ref 26.6–33.0)
MCHC: 33.8 g/dL (ref 31.5–35.7)
MCV: 90 fL (ref 79–97)
Monocytes Absolute: 0.5 10*3/uL (ref 0.1–0.9)
Monocytes: 7 %
Neutrophils Absolute: 4 10*3/uL (ref 1.4–7.0)
Neutrophils: 61 %
Platelets: 233 10*3/uL (ref 150–450)
RBC: 4.07 x10E6/uL (ref 3.77–5.28)
RDW: 12.2 % (ref 11.7–15.4)
WBC: 6.6 10*3/uL (ref 3.4–10.8)

## 2020-08-06 LAB — CMP14+EGFR
ALT: 19 [IU]/L (ref 0–32)
AST: 25 [IU]/L (ref 0–40)
Albumin/Globulin Ratio: 2 (ref 1.2–2.2)
Albumin: 4.8 g/dL (ref 3.8–4.8)
Alkaline Phosphatase: 73 [IU]/L (ref 44–121)
BUN/Creatinine Ratio: 19 (ref 12–28)
BUN: 13 mg/dL (ref 8–27)
Bilirubin Total: 0.6 mg/dL (ref 0.0–1.2)
CO2: 24 mmol/L (ref 20–29)
Calcium: 9.5 mg/dL (ref 8.7–10.3)
Chloride: 102 mmol/L (ref 96–106)
Creatinine, Ser: 0.67 mg/dL (ref 0.57–1.00)
Globulin, Total: 2.4 g/dL (ref 1.5–4.5)
Glucose: 94 mg/dL (ref 65–99)
Potassium: 4.6 mmol/L (ref 3.5–5.2)
Sodium: 141 mmol/L (ref 134–144)
Total Protein: 7.2 g/dL (ref 6.0–8.5)
eGFR: 98 mL/min/{1.73_m2}

## 2020-08-06 LAB — LIPID PANEL
Chol/HDL Ratio: 3.1 ratio (ref 0.0–4.4)
Cholesterol, Total: 213 mg/dL — ABNORMAL HIGH (ref 100–199)
HDL: 69 mg/dL
LDL Chol Calc (NIH): 98 mg/dL (ref 0–99)
Triglycerides: 277 mg/dL — ABNORMAL HIGH (ref 0–149)
VLDL Cholesterol Cal: 46 mg/dL — ABNORMAL HIGH (ref 5–40)

## 2020-08-06 LAB — TSH: TSH: 0.921 u[IU]/mL (ref 0.450–4.500)

## 2020-08-06 LAB — T4, FREE: Free T4: 1.32 ng/dL (ref 0.82–1.77)

## 2020-08-22 ENCOUNTER — Other Ambulatory Visit: Payer: Self-pay | Admitting: Internal Medicine

## 2020-08-28 ENCOUNTER — Other Ambulatory Visit: Payer: Self-pay

## 2020-08-28 MED ORDER — CARVEDILOL 12.5 MG PO TABS: 12.5000 mg | ORAL_TABLET | Freq: Two times a day (BID) | ORAL | 3 refills | Status: DC

## 2020-09-02 ENCOUNTER — Other Ambulatory Visit: Payer: Self-pay | Admitting: Internal Medicine

## 2020-09-20 ENCOUNTER — Other Ambulatory Visit: Payer: Self-pay | Admitting: Internal Medicine

## 2020-09-20 DIAGNOSIS — Z1231 Encounter for screening mammogram for malignant neoplasm of breast: Secondary | ICD-10-CM

## 2020-10-03 ENCOUNTER — Other Ambulatory Visit: Payer: Self-pay | Admitting: Internal Medicine

## 2020-11-13 ENCOUNTER — Other Ambulatory Visit: Payer: Self-pay

## 2020-11-13 ENCOUNTER — Ambulatory Visit
Admission: RE | Admit: 2020-11-13 | Discharge: 2020-11-13 | Disposition: A | Discharge status: Home / Self Care | Payer: Federal, State, Local not specified - PPO | Source: Ambulatory Visit

## 2020-11-13 DIAGNOSIS — Z1231 Encounter for screening mammogram for malignant neoplasm of breast: Secondary | ICD-10-CM

## 2020-11-20 ENCOUNTER — Other Ambulatory Visit: Payer: Self-pay | Admitting: Internal Medicine

## 2020-12-23 DIAGNOSIS — M25562 Pain in left knee: Secondary | ICD-10-CM | POA: Diagnosis not present

## 2021-01-13 DIAGNOSIS — Z8679 Personal history of other diseases of the circulatory system: Secondary | ICD-10-CM | POA: Diagnosis not present

## 2021-01-13 DIAGNOSIS — M79641 Pain in right hand: Secondary | ICD-10-CM | POA: Diagnosis not present

## 2021-01-13 DIAGNOSIS — M25512 Pain in left shoulder: Secondary | ICD-10-CM | POA: Diagnosis not present

## 2021-01-13 DIAGNOSIS — M25511 Pain in right shoulder: Secondary | ICD-10-CM | POA: Diagnosis not present

## 2021-01-13 DIAGNOSIS — M79642 Pain in left hand: Secondary | ICD-10-CM | POA: Diagnosis not present

## 2021-01-13 NOTE — Progress Notes (Signed)
Office Visit    Patient Name: Stephanie Dudley Date of Encounter: 01/14/2021  PCP:  Glendale Chard, [facility]  Cardiologist:  Freada Bergeron, MD  Advanced Practice Provider:  Liliane Shi, PA-C Electrophysiologist:  None      Chief Complaint    [name] is a 65 y.o. female with a hx of recurrent pericarditis with associated pericardial effusion (11/20199, 03/2018, 02/2020), HTN, HLD, mild MVP, hypothyroidism, breast cancer s/p right lumpectomy presents today for cardiology follow up.    Past Medical History    Past Medical History:  Diagnosis Date   Arthritis    Breast mass, right 08/2017   Benign tissue   Complication of anesthesia    hard to wake up with thyroid surgery   Family history of breast cancer    Family history of thyroid cancer    Family history of uterine cancer    Hyperlipidemia    Hypertension    Hypothyroidism    Mitral valve prolapse    a. mild by echo 04/2018.// Echocardiogram 12/21: EF 60-65, no RWMA, mild LVH, GR 1 DD, normal RVSF, mild LAE, myxomatous MV, mild MR, mild MVP, no pericardial effusion    Obesity    Pericardial effusion    Pericarditis    Peripheral edema    ankles, feet   Past Surgical History:  Procedure Laterality Date   APPENDECTOMY  1994   BREAST BIOPSY Left 1999   BREAST LUMPECTOMY WITH RADIOACTIVE SEED LOCALIZATION Right 09/28/2017   Procedure: RIGHT BREAST LUMPECTOMY WITH RADIOACTIVE SEED LOCALIZATION;  Surgeon: Erroll Luna, MD;  Location: [facility];  Service: General;  Laterality: Right;   BREAST SURGERY     KNEE ARTHROSCOPY Left    MIDDLE EAR SURGERY Left 2011   THYROIDECTOMY  1974   goiter--total thyroidectomy   TONSILLECTOMY  1972   WRIST FRACTURE SURGERY      Allergies  Allergies  Allergen Reactions   Sulfa Antibiotics Swelling    History of Present Illness    Stephanie Dudley is a 65 y.o. female with a hx of  recurrent pericarditis  with associated pericardial effusion (01/2018, 03/2018, 02/2020), HTN, HLD, mild MVP, hypothyroidism, breast cancer s/p right lumpectomy last seen 07/16/20.  She has had recurrent pericarditis with associated pericardial effusion. Admitted 01/2018 with idiopathic pericarditis and had poor response to NSAIDs. She was instead treated with steroid taper. She had abnormal RF but rheumatology did not feel she warranted further evaluation. Repeat pericarditis 03/2018 with longer steroid taper and Colchicine for 6 mos. Sh ehad recurrent pericarditis 02/2020. She was last seen 06/2020 and colchicine continued as she was still with intermittent episodes of chest pain.   She presents today for follow up. She went to the rheumatologist yesterday. She hsa been having some aching in her chest and back for a few weeks.  Occurs at rest and is tender on palpation.Marland Kitchen She was started on prednisone tablet.  The hope of rheumatology is to get her on a more preventative agent.  She endorses feeling otherwise well with no exertional dyspnea.  Endorses following a heart healthy diet eating mostly at home. Stays well hydrated and drinks 8 bottles of water per day. She stays busy watching one and two year old grandchildren.   EKGs/Labs/Other Studies Reviewed:   The following studies were reviewed today:  Echocardiogram 03/20/20 EF 60-65, no RWMA, mild LVH, Gr 1 DD, normal PASP, mild LAE, mild MR, mild MVP,  no pericardial eff   Echocardiogram 05/04/2018 EF 60-65, mild LVH, GR 1 DD, normal RVSF, mild MVP, mild calcification of aortic valve, no effusion   Echocardiogram 03/29/2018 EF 60-65, normal wall motion, possible trivial free-flowing pericardial effusion along the RV free wall and apex   Echocardiogram 02/02/2018 Mild LVH, EF 65-70, no RWMA, PASP 31   EKG:  EKG is ordered today.  The ekg ordered today demonstrates NSR 69 bpm with no acute ST/T wave changes.   Recent Labs: 08/05/2020: ALT 19; BUN 13; Creatinine, Ser 0.67;  Hemoglobin 12.4; Platelets 233; Potassium 4.6; Sodium 141; TSH 0.921  Recent Lipid Panel    Component Value Date/Time   CHOL 213 (H) 08/05/2020 1120   TRIG 277 (H) 08/05/2020 1120   HDL 69 08/05/2020 1120   CHOLHDL 3.1 08/05/2020 1120   LDLCALC 98 08/05/2020 1120     Home Medications   Current Meds  Medication Sig   carvedilol (COREG) 12.5 MG tablet Take 1 tablet (12.5 mg total) by mouth 2 (two) times daily.   colchicine 0.6 MG tablet Take 0.6 mg by mouth daily.   escitalopram (LEXAPRO) 10 MG tablet TAKE ONE TABLET BY MOUTH DAILY   furosemide (LASIX) 40 MG tablet TAKE ONE TABLET BY MOUTH EVERY MORNING AND ONE HALF TABLET BY MOUTH IN THE AFTERNOON (Patient taking differently: Take 40 mg by mouth daily.)   predniSONE (STERAPRED UNI-PAK 48 TAB) 10 MG (48) TBPK tablet Take by mouth 2 (two) times daily.   rosuvastatin (CRESTOR) 20 MG tablet TAKE ONE TABLET BY MOUTH DAILY   spironolactone (ALDACTONE) 25 MG tablet TAKE ONE TABLET BY MOUTH DAILY   SYNTHROID 88 MCG tablet TAKE ONE TABLET BY MOUTH DAILY   valsartan (DIOVAN) 160 MG tablet TAKE ONE TABLET BY MOUTH DAILY     Review of Systems      All other systems reviewed and are otherwise negative except as noted above.  Physical Exam    VS:  BP 136/82   Ht 5\' 5"  (1.651 m)   Wt 179 lb 6.4 oz (81.4 kg)   SpO2 95%   BMI 29.85 kg/m  , BMI Body mass index is 29.85 kg/m.  Wt Readings from Last 3 Encounters:  01/14/21 179 lb 6.4 oz (81.4 kg)  08/05/20 179 lb 6.4 oz (81.4 kg)  07/16/20 181 lb (82.1 kg)     GEN: Well nourished, well developed, in no acute distress. HEENT: normal. Neck: Supple, no JVD, carotid bruits, or masses. Cardiac: RRR, no murmurs, rubs, or gallops. No clubbing, cyanosis, edema.  Radials/PT 2+ and equal bilaterally.  Respiratory:  Respirations regular and unlabored, clear to auscultation bilaterally. GI: Soft, nontender, nondistended. MS: No deformity or atrophy. Chest wall tender on palpation.  Skin: Warm  and dry, no rash.  Neuro:  Strength and sensation are intact. Psych: Normal affect.  Assessment & Plan    Idiopathic pericarditis - Mild chest wall tenderness. Saw rheumatology yesterday who started Prednisone taper. Will continue Colchicine 0.6mg  QD while rheumatology is hopefully able to find a more preventative agent.  Ideally would be able to wean off of colchicine at next office visit.  Refills provided.  HTN - Mildly elevated in clinic though not yet taken medications this morning. BP well controlled at home. Continue current antihypertensive regimen. She will report BP consistently >130/80.  Disposition: Follow up in 5 month(s) with Dr. Johney Frame or APP.  Signed, Loel Dubonnet, NP 01/14/2021, 8:58 AM [place]

## 2021-01-14 ENCOUNTER — Ambulatory Visit (HOSPITAL_BASED_OUTPATIENT_CLINIC_OR_DEPARTMENT_OTHER): Payer: Federal, State, Local not specified - PPO | Admitting: Family

## 2021-01-14 ENCOUNTER — Other Ambulatory Visit: Payer: Self-pay

## 2021-01-14 ENCOUNTER — Encounter (HOSPITAL_BASED_OUTPATIENT_CLINIC_OR_DEPARTMENT_OTHER): Payer: Self-pay | Admitting: Family

## 2021-01-14 VITALS — BP 136/82 | Ht 65.0 in | Wt 179.4 lb

## 2021-01-14 DIAGNOSIS — I300 Acute nonspecific idiopathic pericarditis: Secondary | ICD-10-CM | POA: Diagnosis not present

## 2021-01-14 DIAGNOSIS — I10 Essential (primary) hypertension: Secondary | ICD-10-CM

## 2021-01-14 MED ORDER — COLCHICINE 0.6 MG PO TABS: 0.6000 mg | ORAL_TABLET | Freq: Every day | ORAL | 1 refills | Status: DC

## 2021-01-14 NOTE — Patient Instructions (Signed)
Medication Instructions:  Continue your current medications.   *If you need a refill on your cardiac medications before your next appointment, please call your pharmacy*   Lab Work: None ordered today.   Testing/Procedures: Your EKG today showed normal sinus rhythm which is a great result!   Follow-Up: At [facility], you and your health needs are our priority.  As part of our continuing mission to provide you with exceptional heart care, we have created designated Provider Care Teams.  These Care Teams include your primary Cardiologist (physician) and Advanced Practice Providers (APPs -  Physician Assistants and Nurse Practitioners) who all work together to provide you with the care you need, when you need it.  We recommend signing up for the patient portal called "MyChart".  Sign up information is provided on this After Visit Summary.  MyChart is used to connect with patients for Virtual Visits (Telemedicine).  Patients are able to view lab/test results, encounter notes, upcoming appointments, etc.  Non-urgent messages can be sent to your provider as well.   To learn more about what you can do with MyChart, go to [email or web address].    Your next appointment:   In March with Dr. Johney Frame    Other Instructions  Heart Healthy Diet Recommendations: A low-salt diet is recommended. Meats should be grilled, baked, or boiled. Avoid fried foods. Focus on lean protein sources like fish or chicken with vegetables and fruits. The American Heart Association is a Microbiologist!  American Heart Association Diet and Lifeystyle Recommendations    Exercise recommendations: The American Heart Association recommends 150 minutes of moderate intensity exercise weekly. Try 30 minutes of moderate intensity exercise 4-5 times per week. This could include walking, jogging, or swimming.

## 2021-01-29 DIAGNOSIS — M25512 Pain in left shoulder: Secondary | ICD-10-CM | POA: Diagnosis not present

## 2021-01-29 DIAGNOSIS — M25511 Pain in right shoulder: Secondary | ICD-10-CM | POA: Diagnosis not present

## 2021-01-29 DIAGNOSIS — M79642 Pain in left hand: Secondary | ICD-10-CM | POA: Diagnosis not present

## 2021-01-29 DIAGNOSIS — M79641 Pain in right hand: Secondary | ICD-10-CM | POA: Diagnosis not present

## 2021-02-05 DIAGNOSIS — M25512 Pain in left shoulder: Secondary | ICD-10-CM | POA: Diagnosis not present

## 2021-02-12 ENCOUNTER — Ambulatory Visit (INDEPENDENT_AMBULATORY_CARE_PROVIDER_SITE_OTHER): Payer: Federal, State, Local not specified - PPO | Admitting: Internal Medicine

## 2021-02-12 ENCOUNTER — Encounter: Payer: Self-pay | Admitting: Internal Medicine

## 2021-02-12 ENCOUNTER — Other Ambulatory Visit: Payer: Self-pay

## 2021-02-12 VITALS — BP 142/80 | HR 78 | Temp 98.4°F | Ht 65.0 in | Wt 181.0 lb

## 2021-02-12 DIAGNOSIS — Z0000 Encounter for general adult medical examination without abnormal findings: Secondary | ICD-10-CM

## 2021-02-12 DIAGNOSIS — E7800 Pure hypercholesterolemia, unspecified: Secondary | ICD-10-CM | POA: Diagnosis not present

## 2021-02-12 DIAGNOSIS — M25512 Pain in left shoulder: Secondary | ICD-10-CM

## 2021-02-12 DIAGNOSIS — M25511 Pain in right shoulder: Secondary | ICD-10-CM

## 2021-02-12 DIAGNOSIS — Z6830 Body mass index (BMI) 30.0-30.9, adult: Secondary | ICD-10-CM

## 2021-02-12 DIAGNOSIS — E890 Postprocedural hypothyroidism: Secondary | ICD-10-CM

## 2021-02-12 DIAGNOSIS — G8929 Other chronic pain: Secondary | ICD-10-CM

## 2021-02-12 DIAGNOSIS — I10 Essential (primary) hypertension: Secondary | ICD-10-CM | POA: Diagnosis not present

## 2021-02-12 DIAGNOSIS — R7309 Other abnormal glucose: Secondary | ICD-10-CM

## 2021-02-12 DIAGNOSIS — E6609 Other obesity due to excess calories: Secondary | ICD-10-CM

## 2021-02-12 LAB — POCT URINALYSIS DIPSTICK
Bilirubin, UA: NEGATIVE
Blood, UA: NEGATIVE
Glucose, UA: NEGATIVE
Ketones, UA: NEGATIVE
Leukocytes, UA: NEGATIVE
Nitrite, UA: NEGATIVE
Protein, UA: NEGATIVE
Spec Grav, UA: 1.02 (ref 1.010–1.025)
Urobilinogen, UA: 0.2 E.U./dL
pH, UA: 6 (ref 5.0–8.0)

## 2021-02-12 LAB — POCT UA - MICROALBUMIN
Albumin/Creatinine Ratio, Urine, POC: <30
Creatinine, POC: 300 mg/dL
Microalbumin Ur, POC: 10 mg/L

## 2021-02-12 MED ORDER — AMLODIPINE BESYLATE 2.5 MG PO TABS: 2.5000 mg | ORAL_TABLET | Freq: Every day | ORAL | 11 refills | Status: DC

## 2021-02-12 NOTE — Patient Instructions (Signed)

## 2021-02-12 NOTE — Progress Notes (Signed)
I,Tianna Badgett,acting as a Education administrator for Maximino Greenland, MD.,have documented all relevant documentation on the behalf of Maximino Greenland, MD,as directed by  Maximino Greenland, MD while in the presence of Maximino Greenland, MD.  This visit occurred during the SARS-CoV-2 public health emergency.  Safety protocols were in place, including screening questions prior to the visit, additional usage of staff PPE, and extensive cleaning of exam room while observing appropriate contact time as indicated for disinfecting solutions.  Subjective:     Patient ID: Stephanie Dudley , female    DOB: 04-10-1955 , 65 y.o.   MRN: [id number]   Chief Complaint  Patient presents with   Annual Exam   Hypertension     HPI  She is here today for a full physical examination.  She is followed by her [facility] for her pelvic exams.  She denies having headaches, chest pain and shortness of breath. However, she does admit to elevated BP readings. Reports compliance with meds.   Hypertension This is a chronic problem. The current episode started more than 1 year ago. The problem has been gradually improving since onset. The problem is uncontrolled. Pertinent negatives include no blurred vision, chest pain, palpitations or shortness of breath. Risk factors for coronary artery disease include obesity, sedentary lifestyle and post-menopausal state. Past treatments include beta blockers. The current treatment provides moderate improvement. Compliance problems include exercise.     Past Medical History:  Diagnosis Date   Arthritis    Breast mass, right 08/2017   Benign tissue   Complication of anesthesia    hard to wake up with thyroid surgery   Family history of breast cancer    Family history of thyroid cancer    Family history of uterine cancer    Hyperlipidemia    Hypertension    Hypothyroidism    Mitral valve prolapse    a. mild by echo 04/2018.// Echocardiogram 12/21: EF 60-65, no RWMA, mild LVH, GR 1 DD,  normal RVSF, mild LAE, myxomatous MV, mild MR, mild MVP, no pericardial effusion    Obesity    Pericardial effusion    Pericarditis    Peripheral edema    ankles, feet     Family History  Problem Relation Age of Onset   Breast cancer Sister 66   Thyroid cancer Mother    Hypertension Father    Hyperlipidemia Father    Heart Problems Father    Uterine cancer Paternal Grandmother 26     Current Outpatient Medications:    amLODipine (NORVASC) 2.5 MG tablet, Take 1 tablet (2.5 mg total) by mouth daily., Disp: 30 tablet, Rfl: 11   carvedilol (COREG) 12.5 MG tablet, Take 1 tablet (12.5 mg total) by mouth 2 (two) times daily., Disp: 180 tablet, Rfl: 3   colchicine 0.6 MG tablet, Take 1 tablet (0.6 mg total) by mouth daily., Disp: 90 tablet, Rfl: 1   furosemide (LASIX) 40 MG tablet, TAKE ONE TABLET BY MOUTH EVERY MORNING AND ONE HALF TABLET BY MOUTH IN THE AFTERNOON (Patient taking differently: Take 40 mg by mouth daily.), Disp: 135 tablet, Rfl: 1   rosuvastatin (CRESTOR) 20 MG tablet, TAKE ONE TABLET BY MOUTH DAILY, Disp: 90 tablet, Rfl: 1   spironolactone (ALDACTONE) 25 MG tablet, TAKE ONE TABLET BY MOUTH DAILY, Disp: 90 tablet, Rfl: 1   SYNTHROID 88 MCG tablet, TAKE ONE TABLET BY MOUTH DAILY, Disp: 90 tablet, Rfl: 1   valsartan (DIOVAN) 160 MG tablet, TAKE ONE TABLET BY MOUTH  DAILY, Disp: 90 tablet, Rfl: 1   escitalopram (LEXAPRO) 10 MG tablet, TAKE ONE TABLET BY MOUTH DAILY, Disp: 90 tablet, Rfl: 1   meclizine (ANTIVERT) 25 MG tablet, Take 1 tablet (25 mg total) by mouth 3 (three) times daily as needed for dizziness., Disp: 30 tablet, Rfl: 0   Allergies  Allergen Reactions   Sulfa Antibiotics Swelling      The patient states she uses post menopausal status for birth control. Last LMP was No LMP recorded. Patient is postmenopausal.. Negative for Dysmenorrhea. Negative for: breast discharge, breast lump(s), breast pain and breast self exam. Associated symptoms include abnormal vaginal  bleeding. Pertinent negatives include abnormal bleeding (hematology), anxiety, decreased libido, depression, difficulty falling sleep, dyspareunia, history of infertility, nocturia, sexual dysfunction, sleep disturbances, urinary incontinence, urinary urgency, vaginal discharge and vaginal itching. Diet regular.    . The patient's tobacco use is:  Social History   Tobacco Use  Smoking Status Former   Packs/day: 1.00   Years: 15.00   Pack years: 15.00   Types: Cigarettes  Smokeless Tobacco Never  . She has been exposed to passive smoke. The patient's alcohol use is:  Social History   Substance and Sexual Activity  Alcohol Use Yes   Comment: wine 2x/wk   Review of Systems  Constitutional: Negative.   HENT: Negative.    Eyes: Negative.  Negative for blurred vision.  Respiratory: Negative.  Negative for shortness of breath.   Cardiovascular: Negative.  Negative for chest pain and palpitations.  Gastrointestinal: Negative.   Endocrine: Negative.   Genitourinary: Negative.   Musculoskeletal:  Positive for arthralgias.       She c/o b/l shoulder pain. Has pain with movement. Difficulty lifting her arms over her head. Denies fall/trauma. Has upcoming Ortho eval.   Skin: Negative.   Allergic/Immunologic: Negative.   Neurological: Negative.   Hematological: Negative.   Psychiatric/Behavioral: Negative.      Today's Vitals   02/12/21 0850  BP: (!) 142/80  Pulse: 78  Temp: 98.4 F (36.9 C)  TempSrc: Oral  Weight: 181 lb (82.1 kg)  Height: $Remove'5\' 5"'EGXpaGp$  (1.651 m)   Body mass index is 30.12 kg/m.  Wt Readings from Last 3 Encounters:  03/11/21 184 lb 9.6 oz (83.7 kg)  02/22/21 181 lb (82.1 kg)  02/12/21 181 lb (82.1 kg)    Objective:  Physical Exam Vitals and nursing note reviewed.  Constitutional:      Appearance: Normal appearance.  HENT:     Head: Normocephalic and atraumatic.     Right Ear: Tympanic membrane, ear canal and external ear normal.     Left Ear: Tympanic  membrane, ear canal and external ear normal.     Nose:     Comments: Masked     Mouth/Throat:     Comments: Masked  Eyes:     Extraocular Movements: Extraocular movements intact.     Conjunctiva/sclera: Conjunctivae normal.     Pupils: Pupils are equal, round, and reactive to light.  Cardiovascular:     Rate and Rhythm: Normal rate and regular rhythm.     Pulses: Normal pulses.     Heart sounds: Normal heart sounds.  Pulmonary:     Effort: Pulmonary effort is normal.     Breath sounds: Normal breath sounds.  Abdominal:     General: Abdomen is flat. Bowel sounds are normal.     Palpations: Abdomen is soft.  Genitourinary:    Comments: deferred Musculoskeletal:        General:  Tenderness present. Normal range of motion.     Cervical back: Normal range of motion and neck supple.  Skin:    General: Skin is warm and dry.  Neurological:     General: No focal deficit present.     Mental Status: She is alert and oriented to person, place, and time.  Psychiatric:        Mood and Affect: Mood normal.        Behavior: Behavior normal.        Assessment And Plan:     1. Encounter for annual physical exam Comments: A full exam was performed. Importance of monthly self breast exams was discussed with the patient. PATIENT IS ADVISED TO GET 30-45 MINUTES REGULAR EXERCISE NO LESS THAN FOUR TO FIVE DAYS PER WEEK - BOTH WEIGHTBEARING EXERCISES AND AEROBIC ARE RECOMMENDED.  PATIENT IS ADVISED TO FOLLOW A HEALTHY DIET WITH AT LEAST SIX FRUITS/VEGGIES PER DAY, DECREASE INTAKE OF RED MEAT, AND TO INCREASE FISH INTAKE TO TWO DAYS PER WEEK.  MEATS/FISH SHOULD NOT BE FRIED, BAKED OR BROILED IS PREFERABLE.  IT IS ALSO IMPORTANT TO CUT BACK ON YOUR SUGAR INTAKE. PLEASE AVOID ANYTHING WITH ADDED SUGAR, CORN SYRUP OR OTHER SWEETENERS. IF YOU MUST USE A SWEETENER, YOU CAN TRY STEVIA. IT IS ALSO IMPORTANT TO AVOID ARTIFICIALLY SWEETENERS AND DIET BEVERAGES. LASTLY, I SUGGEST WEARING SPF 50 SUNSCREEN ON EXPOSED  PARTS AND ESPECIALLY WHEN IN THE DIRECT SUNLIGHT FOR AN EXTENDED PERIOD OF TIME.  PLEASE AVOID FAST FOOD RESTAURANTS AND INCREASE YOUR WATER INTAKE.  - CBC - Hemoglobin A1c - CMP14+EGFR - Lipid panel - TSH + free T4  2. Essential hypertension Comments: Uncontrolled. She also reports elevated BP at home. I will add amlodipine 2.$RemoveBeforeDE'5mg'[id number]$  nightly. She agrees to rto in 2-3 weeks for a nurse visit. Advised to check BP readings at home, avoid processed meats including bacon, sausages and deli meats and packaged foods which tend to be high in sodium.   - POCT Urinalysis Dipstick (81002) - POCT UA - Microalbumin - amLODipine (NORVASC) 2.5 MG tablet; Take 1 tablet (2.5 mg total) by mouth daily.  Dispense: 30 tablet; Refill: 11  3. Pure hypercholesterolemia Comments: Chronic, I will check lipid panel today. Encouraged to follow heart healthy lifestyle encouraged to take rosuvastatin as prescribed. - TSH + free T4  4. Postoperative hypothyroidism Comments: I will check thyroid panel and adjust meds as needed.  - TSH + free T4  5. Chronic pain of both shoulders Comments: Sx suggestive of adhesive capsulitis. She has upcoming Ortho appt.   6. Class 1 obesity due to excess calories with serious comorbidity and body mass index (BMI) of 30.0 to 30.9 in adult Comments: She is encouraged to aim for at least 150 minutes of exercise per week.   Patient was given opportunity to ask questions. Patient verbalized understanding of the plan and was able to repeat key elements of the plan. All questions were answered to their satisfaction.   I, Maximino Greenland, MD, have reviewed all documentation for this visit. The documentation on 03/15/21 for the exam, diagnosis, procedures, and orders are all accurate and complete.   THE PATIENT IS ENCOURAGED TO PRACTICE SOCIAL DISTANCING DUE TO THE COVID-19 PANDEMIC.

## 2021-02-13 LAB — LIPID PANEL
Chol/HDL Ratio: 3 ratio (ref 0.0–4.4)
Cholesterol, Total: 218 mg/dL — ABNORMAL HIGH (ref 100–199)
HDL: 73 mg/dL (ref >39)
LDL Chol Calc (NIH): 106 mg/dL — ABNORMAL HIGH (ref 0–99)
Triglycerides: 231 mg/dL — ABNORMAL HIGH (ref 0–149)
VLDL Cholesterol Cal: 39 mg/dL (ref 5–40)

## 2021-02-13 LAB — CMP14+EGFR
ALT: 23 IU/L (ref 0–32)
AST: 21 IU/L (ref 0–40)
Albumin/Globulin Ratio: 2.5 — ABNORMAL HIGH (ref 1.2–2.2)
Albumin: 4.7 g/dL (ref 3.8–4.8)
Alkaline Phosphatase: 79 IU/L (ref 44–121)
BUN/Creatinine Ratio: 14 (ref 12–28)
BUN: 11 mg/dL (ref 8–27)
Bilirubin Total: 0.8 mg/dL (ref 0.0–1.2)
CO2: 25 mmol/L (ref 20–29)
Calcium: 9.5 mg/dL (ref 8.7–10.3)
Chloride: 102 mmol/L (ref 96–106)
Creatinine, Ser: 0.79 mg/dL (ref 0.57–1.00)
Globulin, Total: 1.9 g/dL (ref 1.5–4.5)
Glucose: 101 mg/dL — ABNORMAL HIGH (ref 70–99)
Potassium: 4.1 mmol/L (ref 3.5–5.2)
Sodium: 140 mmol/L (ref 134–144)
Total Protein: 6.6 g/dL (ref 6.0–8.5)
eGFR: 84 mL/min/{1.73_m2} (ref >59)

## 2021-02-13 LAB — HEMOGLOBIN A1C
Est. average glucose Bld gHb Est-mCnc: 126 mg/dL
Hgb A1c MFr Bld: 6 % — ABNORMAL HIGH (ref 4.8–5.6)

## 2021-02-13 LAB — CBC
Hematocrit: 37.5 % (ref 34.0–46.6)
Hemoglobin: 12.2 g/dL (ref 11.1–15.9)
MCH: 30.9 pg (ref 26.6–33.0)
MCHC: 32.5 g/dL (ref 31.5–35.7)
MCV: 95 fL (ref 79–97)
Platelets: 278 10*3/uL (ref 150–450)
RBC: 3.95 x10E6/uL (ref 3.77–5.28)
RDW: 12.1 % (ref 11.7–15.4)
WBC: 5.4 10*3/uL (ref 3.4–10.8)

## 2021-02-13 LAB — TSH+FREE T4
Free T4: 1.31 ng/dL (ref 0.82–1.77)
TSH: 1.65 u[IU]/mL (ref 0.450–4.500)

## 2021-02-17 ENCOUNTER — Other Ambulatory Visit: Payer: Self-pay | Admitting: Internal Medicine

## 2021-02-19 ENCOUNTER — Telehealth: Payer: Self-pay

## 2021-02-19 ENCOUNTER — Encounter: Payer: Self-pay | Admitting: Internal Medicine

## 2021-02-19 NOTE — Telephone Encounter (Signed)
Patient sent a message saying she was feeling dizzy and that she thought it was her blood pressure. She reported her readings were running low at 95/47. She was advised to stop taking the almodipine and continue to monitor her blood pressure readings in the morning and evenings. Pt agreed [employer]

## 2021-02-22 ENCOUNTER — Emergency Department (HOSPITAL_BASED_OUTPATIENT_CLINIC_OR_DEPARTMENT_OTHER): Payer: Federal, State, Local not specified - PPO

## 2021-02-22 ENCOUNTER — Encounter (HOSPITAL_BASED_OUTPATIENT_CLINIC_OR_DEPARTMENT_OTHER): Payer: Self-pay

## 2021-02-22 ENCOUNTER — Emergency Department (HOSPITAL_BASED_OUTPATIENT_CLINIC_OR_DEPARTMENT_OTHER)
Admission: EM | Admit: 2021-02-22 | Discharge: 2021-02-22 | Disposition: A | Discharge status: Home / Self Care | Payer: Federal, State, Local not specified - PPO | Attending: Emergency Medicine | Admitting: Emergency Medicine

## 2021-02-22 DIAGNOSIS — R42 Dizziness and giddiness: Secondary | ICD-10-CM | POA: Diagnosis not present

## 2021-02-22 DIAGNOSIS — I639 Cerebral infarction, unspecified: Secondary | ICD-10-CM | POA: Diagnosis not present

## 2021-02-22 DIAGNOSIS — I10 Essential (primary) hypertension: Secondary | ICD-10-CM | POA: Insufficient documentation

## 2021-02-22 DIAGNOSIS — Z87891 Personal history of nicotine dependence: Secondary | ICD-10-CM | POA: Insufficient documentation

## 2021-02-22 DIAGNOSIS — E039 Hypothyroidism, unspecified: Secondary | ICD-10-CM | POA: Insufficient documentation

## 2021-02-22 DIAGNOSIS — Z79899 Other long term (current) drug therapy: Secondary | ICD-10-CM | POA: Diagnosis not present

## 2021-02-22 LAB — CBC WITH DIFFERENTIAL/PLATELET
Abs Immature Granulocytes: 0.02 10*3/uL (ref 0.00–0.07)
Basophils Absolute: 0 10*3/uL (ref 0.0–0.1)
Basophils Relative: 1 %
Eosinophils Absolute: 0 10*3/uL (ref 0.0–0.5)
Eosinophils Relative: 1 %
HCT: 36.7 % (ref 36.0–46.0)
Hemoglobin: 11.9 g/dL — ABNORMAL LOW (ref 12.0–15.0)
Immature Granulocytes: 0 %
Lymphocytes Relative: 39 %
Lymphs Abs: 2.2 10*3/uL (ref 0.7–4.0)
MCH: 31.2 pg (ref 26.0–34.0)
MCHC: 32.4 g/dL (ref 30.0–36.0)
MCV: 96.3 fL (ref 80.0–100.0)
Monocytes Absolute: 0.7 10*3/uL (ref 0.1–1.0)
Monocytes Relative: 12 %
Neutro Abs: 2.7 10*3/uL (ref 1.7–7.7)
Neutrophils Relative %: 47 %
Platelets: 283 10*3/uL (ref 150–400)
RBC: 3.81 MIL/uL — ABNORMAL LOW (ref 3.87–5.11)
RDW: 13.1 % (ref 11.5–15.5)
WBC: 5.6 10*3/uL (ref 4.0–10.5)
nRBC: 0 % (ref 0.0–0.2)

## 2021-02-22 LAB — BASIC METABOLIC PANEL
Anion gap: 9 (ref 5–15)
BUN: 10 mg/dL (ref 8–23)
CO2: 24 mmol/L (ref 22–32)
Calcium: 8.7 mg/dL — ABNORMAL LOW (ref 8.9–10.3)
Chloride: 104 mmol/L (ref 98–111)
Creatinine, Ser: 0.71 mg/dL (ref 0.44–1.00)
GFR, Estimated: >60 mL/min (ref >60)
Glucose, Bld: 115 mg/dL — ABNORMAL HIGH (ref 70–99)
Potassium: 3.7 mmol/L (ref 3.5–5.1)
Sodium: 137 mmol/L (ref 135–145)

## 2021-02-22 MED ORDER — LORAZEPAM 2 MG/ML IJ SOLN
1.0000 mg | Freq: Once | INTRAMUSCULAR | Status: AC
  Administered 2021-02-22: 1 mg via INTRAVENOUS
  Filled 2021-02-22: qty 1

## 2021-02-22 MED ORDER — MECLIZINE HCL 25 MG PO TABS
25.0000 mg | ORAL_TABLET | Freq: Once | ORAL | Status: AC
  Administered 2021-02-22: 25 mg via ORAL
  Filled 2021-02-22: qty 1

## 2021-02-22 MED ORDER — MECLIZINE HCL 25 MG PO TABS: 25.0000 mg | ORAL_TABLET | Freq: Three times a day (TID) | ORAL | 0 refills | Status: DC | PRN

## 2021-02-22 MED ORDER — ONDANSETRON HCL 4 MG/2ML IJ SOLN
4.0000 mg | Freq: Once | INTRAMUSCULAR | Status: AC
  Administered 2021-02-22: 4 mg via INTRAVENOUS
  Filled 2021-02-22: qty 2

## 2021-02-22 MED ORDER — SODIUM CHLORIDE 0.9 % IV BOLUS
1000.0000 mL | Freq: Once | INTRAVENOUS | Status: AC
  Administered 2021-02-22: 1000 mL via INTRAVENOUS

## 2021-02-22 NOTE — ED Notes (Signed)
Pt back from MRI 

## 2021-02-22 NOTE — ED Notes (Signed)
ED Provider at bedside. 

## 2021-02-22 NOTE — ED Provider Notes (Signed)
[facility] Provider Note   CSN: [id number] Arrival date & time: 02/22/21  1343     History Chief Complaint  Patient presents with   Dizziness    Stephanie Dudley is a 65 y.o. female presents with a chief complaint of dizziness.  Patient states that symptoms started on 11/19.  Dizziness has been constant since then.  Over this time dizziness has waxed and waned in intensity.  Patient reports that dizziness is worse with moving her head and worse when changing positions.  Patient states that she feels "off balance," due to her dizziness.  Patient reports falling twice due to her dizziness.  Last fall occurred 2 days prior.  Patient denies hitting her head, loss consciousness, neck pain/injuries from her falls.  Patient reports that she has had some blurry vision with sudden head movement.  Blurry vision last for 1 minute or less and then resolves.  Patient reports she has had some improvement in her symptoms when taking Dramamine but symptoms come back after this medication wears off.  Patient reports that she has had vertigo in the past and this feels similar to previous episodes.  Patient stopped taking her amlodipine medication 2 days prior at the behest of her PCP due to reported low blood pressure.  Patient endorses drinking 3 glasses of wine per week.  Denies any illicit drug use.  Patient is not on any blood thinners.  Patient denies any new medications.  Patient denies any numbness, weakness, facial asymmetry, dysarthria, syncope, seizure, lightheadedness, chest pain, palpitations.   Dizziness Associated symptoms: no chest pain, no headaches, no nausea, no palpitations, no shortness of breath, no vomiting and no weakness       Past Medical History:  Diagnosis Date   Arthritis    Breast mass, right 08/2017   Benign tissue   Complication of anesthesia    hard to wake up with thyroid surgery   Family history of breast cancer    Family history of  thyroid cancer    Family history of uterine cancer    Hyperlipidemia    Hypertension    Hypothyroidism    Mitral valve prolapse    a. 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   Hyperlipidemia    Essential hypertension    Hypothyroidism    Ankle swelling    Genetic testing 11/08/2017   Family history of breast cancer    Family history of thyroid cancer    Family history of uterine cancer     Past Surgical History:  Procedure Laterality Date   APPENDECTOMY  1994   BREAST BIOPSY Left 1999   BREAST LUMPECTOMY WITH RADIOACTIVE SEED LOCALIZATION Right 09/28/2017   Procedure: RIGHT BREAST LUMPECTOMY WITH RADIOACTIVE SEED LOCALIZATION;  Surgeon: Erroll Luna, MD;  Location: [facility];  Service: General;  Laterality: Right;   BREAST SURGERY     KNEE ARTHROSCOPY  Left    MIDDLE EAR SURGERY Left 2011   THYROIDECTOMY  1974   goiter--total thyroidectomy   TONSILLECTOMY  1972   WRIST FRACTURE SURGERY       OB History   No obstetric history on file.     Family History  Problem Relation Age of Onset   Breast cancer Sister 34   Thyroid cancer Mother    Hypertension Father    Hyperlipidemia Father    Heart Problems Father    Uterine cancer Paternal Grandmother 46    Social History   Tobacco Use   Smoking status: Former    Packs/day: 1.00    Years: 15.00    Pack years: 15.00    Types: Cigarettes   Smokeless tobacco: Never  Vaping Use    Vaping Use: Never used  Substance Use Topics   Alcohol use: Yes    Comment: wine 2x/wk   Drug use: No    Home Medications Prior to Admission medications   Medication Sig Start Date End Date Taking? Authorizing Provider  amLODipine (NORVASC) 2.5 MG tablet Take 1 tablet (2.5 mg total) by mouth daily. 02/12/21 02/12/22  Glendale Chard, MD  carvedilol (COREG) 12.5 MG tablet Take 1 tablet (12.5 mg total) by mouth 2 (two) times daily. 08/28/20   Richardson Dopp T, PA-C  colchicine 0.6 MG tablet Take 1 tablet (0.6 mg total) by mouth daily. 01/14/21   Loel Dubonnet, NP  escitalopram (LEXAPRO) 10 MG tablet TAKE ONE TABLET BY MOUTH DAILY 02/17/21   Glendale Chard, MD  furosemide (LASIX) 40 MG tablet TAKE ONE TABLET BY MOUTH EVERY MORNING AND ONE HALF TABLET BY MOUTH IN THE AFTERNOON Patient taking differently: Take 40 mg by mouth daily. 11/22/20   Glendale Chard, MD  rosuvastatin (CRESTOR) 20 MG tablet TAKE ONE TABLET BY MOUTH DAILY 10/07/20   Glendale Chard, MD  spironolactone (ALDACTONE) 25 MG tablet TAKE ONE TABLET BY MOUTH DAILY 10/07/20   Glendale Chard, MD  SYNTHROID 88 MCG tablet TAKE ONE TABLET BY MOUTH DAILY 10/07/20   Glendale Chard, MD  valsartan (DIOVAN) 160 MG tablet TAKE ONE TABLET BY MOUTH DAILY 10/07/20   Glendale Chard, MD    Allergies    Sulfa antibiotics  Review of Systems   Review of Systems  Constitutional:  Negative for chills and fever.  Eyes:  Positive for visual disturbance. Negative for photophobia.  Respiratory:  Negative for shortness of breath.   Cardiovascular:  Negative for chest pain and palpitations.  Gastrointestinal:  Negative for abdominal pain, nausea and vomiting.  Genitourinary:  Negative for difficulty urinating and dysuria.  Musculoskeletal:  Negative for back pain and neck pain.  Skin:  Negative for color change and rash.  Neurological:  Positive for dizziness. Negative for tremors, seizures, syncope, facial asymmetry, speech difficulty, weakness,  light-headedness, numbness and headaches.  Psychiatric/Behavioral:  Negative for confusion.    Physical Exam Updated Vital Signs BP 124/72 (BP Location: Right Arm)   Pulse 80   Temp 98.2 F (36.8 C) (Oral)   Resp 18   Ht 5\' 6"  (1.676 m)   Wt 82.1 kg   SpO2 98%   BMI 29.21 kg/m   Physical Exam Vitals and nursing note reviewed.  Constitutional:      General: She is not in acute distress.    Appearance: She is not ill-appearing, toxic-appearing or diaphoretic.  HENT:     Head: Normocephalic and atraumatic.  Eyes:     General: No scleral icterus.  Right eye: No discharge.        Left eye: No discharge.     Extraocular Movements: Extraocular movements intact.     Pupils: Pupils are equal, round, and reactive to light.  Cardiovascular:     Rate and Rhythm: Normal rate.  Pulmonary:     Effort: Pulmonary effort is normal.  Abdominal:     Palpations: Abdomen is soft.     Tenderness: There is no abdominal tenderness.  Musculoskeletal:     Cervical back: Normal range of motion and neck supple. No rigidity.     Comments: No midline tenderness or deformity to cervical, thoracic, or lumbar spine.  Skin:    General: Skin is warm and dry.  Neurological:     General: No focal deficit present.     Mental Status: She is alert and oriented to person, place, and time.     GCS: GCS eye subscore is 4. GCS verbal subscore is 5. GCS motor subscore is 6.     Cranial Nerves: No cranial nerve deficit or facial asymmetry.     Sensory: Sensation is intact.     Motor: No weakness, tremor, seizure activity or pronator drift.     Coordination: Heel to [facility] Test normal.     Gait: Gait abnormal.     Comments: CN II-XII intact, equal grip strength, +5 strength to bilateral upper and lower extremities.  Patient unable to complete finger-to-nose due to reports of "frozen shoulder."    Patient able to stand without difficulty.  Patient has unsteady gait   Psychiatric:        Behavior: Behavior  is cooperative.    ED Results / Procedures / Treatments   Labs (all labs ordered are listed, but only abnormal results are displayed) Labs Reviewed  BASIC METABOLIC PANEL - Abnormal; Notable for the following components:      Result Value   Glucose, Bld 115 (*)    Calcium 8.7 (*)    All other components within normal limits  CBC WITH DIFFERENTIAL/PLATELET - Abnormal; Notable for the following components:   RBC 3.81 (*)    Hemoglobin 11.9 (*)    All other components within normal limits    EKG EKG Interpretation  Date/Time:  Saturday February 22 2021 14:18:44 EST Ventricular Rate:  71 PR Interval:  159 QRS Duration: 87 QT Interval:  397 QTC Calculation: 432 R Axis:   71 Text Interpretation: Sinus rhythm Confirmed by Lennice Sites ([id number]) on 02/22/2021 2:21:08 PM  Radiology CT Head Wo Contrast  Result Date: 02/22/2021 CLINICAL DATA:  Dizziness, nonspecific. EXAM: CT HEAD WITHOUT CONTRAST TECHNIQUE: Contiguous axial images were obtained from the base of the skull through the vertex without intravenous contrast. COMPARISON:  None. FINDINGS: Brain: Subtle areas of low-density in the white matter is nonspecific but suspect this represents chronic changes. No evidence for acute hemorrhage, mass lesion, midline shift, hydrocephalus or large infarct. Vascular: No hyperdense vessel or unexpected calcification. Skull: Normal. Negative for fracture or focal lesion. Sinuses/Orbits: Mild mucosal thickening in the maxillary sinuses bilaterally. Mastoid air cells are clear. Other: None. IMPRESSION: 1. No acute intracranial abnormality. 2. Subtle areas of low-density in the white matter are nonspecific but could represent chronic small vessel ischemic changes. 3. Mild paranasal sinus disease. Electronically Signed   By: Markus Daft M.D.   On: 02/22/2021 15:08   MR BRAIN WO CONTRAST  Result Date: 02/22/2021 CLINICAL DATA:  TIA; dizziness/vertigo EXAM: MRI HEAD WITHOUT CONTRAST TECHNIQUE: Multiplanar,  multiecho pulse sequences  of the brain and surrounding structures were obtained without intravenous contrast. COMPARISON:  None. FINDINGS: Brain: There is no acute infarction or intracranial hemorrhage. There is no intracranial mass, mass effect, or edema. There is no hydrocephalus or extra-axial fluid collection. Patchy T2 hyperintensity in the supratentorial and pontine white matter is nonspecific but may reflect mild to moderate chronic microvascular ischemic changes. Small chronic bilateral cerebellar infarcts. Ventricles and sulci are normal in size and configuration. Vascular: Major vessel flow voids at the skull base are preserved. Skull and upper cervical spine: Normal marrow signal is preserved. Sinuses/Orbits: Mild mucosal thickening.  Orbits are unremarkable. Other: Sella is partially empty.  Mastoid air cells are clear. IMPRESSION: No acute infarction, hemorrhage, mass. Mild to moderate chronic microvascular ischemic changes. Electronically Signed   By: [facility] Mis M.D.   On: 02/22/2021 17:15    Procedures Procedures   Medications Ordered in ED Medications  meclizine (ANTIVERT) tablet 25 mg (25 mg Oral Given 02/22/21 1434)  sodium chloride 0.9 % bolus 1,000 mL (0 mLs Intravenous Stopped 02/22/21 1538)  ondansetron (ZOFRAN) injection 4 mg (4 mg Intravenous Given 02/22/21 1437)  LORazepam (ATIVAN) injection 1 mg (1 mg Intravenous Given 02/22/21 1608)    ED Course  I have reviewed the triage vital signs and the nursing notes.  Pertinent labs & imaging results that were available during my care of the patient were reviewed by me and considered in my medical decision making (see chart for details).    MDM Rules/Calculators/A&P                           Alert 65 year old female in no acute distress, nontoxic-appearing.  Presents to ED with chief complaint of dizziness.  Patient has no focal neurological deficits.  Noted to have unsteady gait with ambulation.  Orthostatic vitals  performed by provider at bedside: Laying 130/87, sitting 141/87, standing 114/67.  Will obtain basic lab work, EKG, and noncontrast head CT.  The patient meclizine, fluid bolus, and Zofran.  Plan to reassess.  BMP and CBC are unremarkable. EKG shows sinus rhythm Noncontrast head CT shows no acute intracranial abnormality.  On reassessment after patient received meclizine and fluid bolus she report improvement in symptoms however still has unsteady gait with ambulation.  Shared decision making with patient about obtaining MRI to evaluate for possible cerebellar infarct.  Patient is agreeable with MRI at this time.  We will give patient Ativan for additional help with her dizziness.  MRI shows no acute intracranial abnormality.  Patient reports improvement in her symptoms.  Able to stand and ambulate without difficulty.  No focal neurological deficits noted.  We will plan to discharge patient at this time with prescription of meclizine.  Patient to follow-up with PCP.  Patient given information to follow-up with neurology if she has continued symptoms.  Discussed results, findings, treatment and follow up. Patient advised of return precautions. Patient verbalized understanding and agreed with plan.  Patient was discussed with attending physician Dr.Curatolo.    Final Clinical Impression(s) / ED Diagnoses Final diagnoses:  Dizziness    Rx / DC Orders ED Discharge Orders          Ordered    meclizine (ANTIVERT) 25 MG tablet  3 times daily PRN        02/22/21 1745             Loni Beckwith, PA-C 02/23/21 0019    Lennice Sites, DO 02/23/21 [phone]

## 2021-02-22 NOTE — ED Triage Notes (Signed)
Pt c/o dizziness/nausea x 1 week. States hx of vertigo, feels the same. Worse when turning head.

## 2021-02-22 NOTE — Discharge Instructions (Signed)
You came to the emergency department today to be evaluated for your dizziness.  Your dizziness improved after receiving meclizine and Ativan.  I have given you a prescription for meclizine to take at home.  Please follow-up closely with your primary care provider.  If your symptoms do not improve please follow-up with the neurologist listed on this paperwork.  Your MRI showed no signs of an acute stroke.  Your lab tests were reassuring.  Get help right away if: You vomit or have diarrhea and are unable to eat or drink anything. You have problems talking, walking, swallowing, or using your arms, hands, or legs. You feel generally weak. You have any bleeding. You are not thinking clearly or you have trouble forming sentences. It may take a friend or family member to notice this. You have chest pain, abdominal pain, shortness of breath, or sweating. Your vision changes or you develop a severe headache.

## 2021-02-22 NOTE — ED Notes (Signed)
Pt transported to MRI 

## 2021-02-22 NOTE — ED Notes (Signed)
Pt transported to CT ?

## 2021-02-22 NOTE — ED Notes (Signed)
D/c paperwork reviewed with pt, including prescription and f/u care. Pt verbalized understanding, no questions or concerns at time of d/c. Pt accompanied by RN to ED exit to meet ride.

## 2021-02-25 DIAGNOSIS — M25512 Pain in left shoulder: Secondary | ICD-10-CM | POA: Diagnosis not present

## 2021-03-03 DIAGNOSIS — M25512 Pain in left shoulder: Secondary | ICD-10-CM | POA: Diagnosis not present

## 2021-03-11 ENCOUNTER — Ambulatory Visit: Payer: Federal, State, Local not specified - PPO

## 2021-03-11 ENCOUNTER — Other Ambulatory Visit: Payer: Self-pay

## 2021-03-11 VITALS — BP 156/90 | HR 72 | Temp 98.6°F | Ht 66.0 in | Wt 184.6 lb

## 2021-03-11 DIAGNOSIS — I10 Essential (primary) hypertension: Secondary | ICD-10-CM

## 2021-03-11 NOTE — Progress Notes (Signed)
The patient is here for a blood pressure check.  The patient said she recently was told to stop the amlodipine 5mg  because it was causing her blood pressure to drop too low. The patient said she stopped the amlodipine on December 1st. The patient said that she didn't take her blood pressure medication today because she hadn't eaten yet.  The patient was told to come back for a nurse visit to have her blood pressure rechecked in 2 wks and to make sure she has taken her meds before her appointment.   No changes to her medications were made at this visit.

## 2021-03-19 ENCOUNTER — Other Ambulatory Visit: Payer: Self-pay | Admitting: Internal Medicine

## 2021-03-21 ENCOUNTER — Telehealth: Payer: Self-pay | Admitting: Family

## 2021-03-21 NOTE — Telephone Encounter (Signed)
° °  Primary Cardiologist: Freada Bergeron, MD  Left message for patient to call back RCRI Risk calculator: Low, Class I Risk 0.4%   Emmaline Life, NP-C    03/21/2021, 9:45 AM [place] [address] Office [phone] Fax [phone]

## 2021-03-21 NOTE — Telephone Encounter (Signed)
° °  Primary Cardiologist: Freada Bergeron, MD  Chart reviewed as part of pre-operative protocol coverage. Given past medical history and time since last visit, based on ACC/AHA guidelines, Stephanie Dudley would be at acceptable risk for the planned procedure without further cardiovascular testing. She is able to complete > 4 METS activity on a consistent basis without concern. She denies chest pain, dyspnea, palpitations, edema, pre-syncope, syncope, PND, or orthopnea.  Patient was advised that if he/she develops new symptoms prior to surgery to contact our office to arrange a follow-up appointment.  She verbalized understanding.  I will route this recommendation to the requesting party via Epic fax function and remove from pre-op pool.  Please call with questions.  Emmaline Life, NP-C    03/21/2021, 11:37 AM [place] [address], [address] Office [phone] Fax [phone]

## 2021-03-21 NOTE — Telephone Encounter (Signed)
° °  Pre-operative Risk Assessment    Patient Name: Stephanie Dudley  DOB: [birth year] MRN: [id number]      Request for Surgical Clearance    Procedure:  Left Shoulder Scope  Date of Surgery:  Clearance  04-17-21                                 Surgeon:  Dr Rhona Raider Surgeon's Group or Practice Name:   Phone number:  [phone] Fax number:  [phone]   Type of Clearance Requested:    Both   Type of Anesthesia:  Choice   Additional requests/questions:    Signed, Glyn Ade   03/21/2021, 9:28 AM

## 2021-03-21 NOTE — Telephone Encounter (Signed)
Patient returning call.

## 2021-03-25 ENCOUNTER — Other Ambulatory Visit: Payer: Self-pay

## 2021-03-25 ENCOUNTER — Ambulatory Visit: Payer: Federal, State, Local not specified - PPO

## 2021-03-25 VITALS — BP 122/80 | HR 77 | Temp 98.2°F | Ht 66.0 in | Wt 183.0 lb

## 2021-03-25 DIAGNOSIS — I10 Essential (primary) hypertension: Secondary | ICD-10-CM

## 2021-03-25 NOTE — Progress Notes (Signed)
Pt presents today for BPC. She discontinued amlodipine 2.5mg  from last visit. Continues to take Spironolactone 25MG  & Valsartan 160MG . She reports to drinking 7/8 bottles of water a day. She still complains of mild dizziness, only when she stands. She has Meclizine at home from ED visit, no longer taken at the moment.  BP Readings from Last 3 Encounters:  03/25/21 122/80  03/11/21 (!) 156/90  02/22/21 126/74  Provider would like her to continue w current meds. Patient has appointment on 08/20/2021.

## 2021-04-17 DIAGNOSIS — M75122 Complete rotator cuff tear or rupture of left shoulder, not specified as traumatic: Secondary | ICD-10-CM | POA: Diagnosis not present

## 2021-04-17 DIAGNOSIS — M75112 Incomplete rotator cuff tear or rupture of left shoulder, not specified as traumatic: Secondary | ICD-10-CM | POA: Diagnosis not present

## 2021-04-17 DIAGNOSIS — G8918 Other acute postprocedural pain: Secondary | ICD-10-CM | POA: Diagnosis not present

## 2021-04-17 DIAGNOSIS — M7542 Impingement syndrome of left shoulder: Secondary | ICD-10-CM | POA: Diagnosis not present

## 2021-04-23 ENCOUNTER — Encounter: Payer: Self-pay | Admitting: Internal Medicine

## 2021-04-28 DIAGNOSIS — S46092D Other injury of muscle(s) and tendon(s) of the rotator cuff of left shoulder, subsequent encounter: Secondary | ICD-10-CM | POA: Diagnosis not present

## 2021-04-28 DIAGNOSIS — M7502 Adhesive capsulitis of left shoulder: Secondary | ICD-10-CM | POA: Diagnosis not present

## 2021-05-01 DIAGNOSIS — S46092D Other injury of muscle(s) and tendon(s) of the rotator cuff of left shoulder, subsequent encounter: Secondary | ICD-10-CM | POA: Diagnosis not present

## 2021-05-01 DIAGNOSIS — M7502 Adhesive capsulitis of left shoulder: Secondary | ICD-10-CM | POA: Diagnosis not present

## 2021-05-06 DIAGNOSIS — M7502 Adhesive capsulitis of left shoulder: Secondary | ICD-10-CM | POA: Diagnosis not present

## 2021-05-06 DIAGNOSIS — S46092D Other injury of muscle(s) and tendon(s) of the rotator cuff of left shoulder, subsequent encounter: Secondary | ICD-10-CM | POA: Diagnosis not present

## 2021-05-08 DIAGNOSIS — M7502 Adhesive capsulitis of left shoulder: Secondary | ICD-10-CM | POA: Diagnosis not present

## 2021-05-08 DIAGNOSIS — S46092D Other injury of muscle(s) and tendon(s) of the rotator cuff of left shoulder, subsequent encounter: Secondary | ICD-10-CM | POA: Diagnosis not present

## 2021-05-13 DIAGNOSIS — M7502 Adhesive capsulitis of left shoulder: Secondary | ICD-10-CM | POA: Diagnosis not present

## 2021-05-13 DIAGNOSIS — S46092D Other injury of muscle(s) and tendon(s) of the rotator cuff of left shoulder, subsequent encounter: Secondary | ICD-10-CM | POA: Diagnosis not present

## 2021-05-15 DIAGNOSIS — M7502 Adhesive capsulitis of left shoulder: Secondary | ICD-10-CM | POA: Diagnosis not present

## 2021-05-15 DIAGNOSIS — S46092D Other injury of muscle(s) and tendon(s) of the rotator cuff of left shoulder, subsequent encounter: Secondary | ICD-10-CM | POA: Diagnosis not present

## 2021-05-20 DIAGNOSIS — M7502 Adhesive capsulitis of left shoulder: Secondary | ICD-10-CM | POA: Diagnosis not present

## 2021-05-20 DIAGNOSIS — S46092D Other injury of muscle(s) and tendon(s) of the rotator cuff of left shoulder, subsequent encounter: Secondary | ICD-10-CM | POA: Diagnosis not present

## 2021-05-22 DIAGNOSIS — M7502 Adhesive capsulitis of left shoulder: Secondary | ICD-10-CM | POA: Diagnosis not present

## 2021-05-22 DIAGNOSIS — S46092D Other injury of muscle(s) and tendon(s) of the rotator cuff of left shoulder, subsequent encounter: Secondary | ICD-10-CM | POA: Diagnosis not present

## 2021-05-27 DIAGNOSIS — M7502 Adhesive capsulitis of left shoulder: Secondary | ICD-10-CM | POA: Diagnosis not present

## 2021-05-27 DIAGNOSIS — S46092D Other injury of muscle(s) and tendon(s) of the rotator cuff of left shoulder, subsequent encounter: Secondary | ICD-10-CM | POA: Diagnosis not present

## 2021-06-03 DIAGNOSIS — M7502 Adhesive capsulitis of left shoulder: Secondary | ICD-10-CM | POA: Diagnosis not present

## 2021-06-03 DIAGNOSIS — S46092D Other injury of muscle(s) and tendon(s) of the rotator cuff of left shoulder, subsequent encounter: Secondary | ICD-10-CM | POA: Diagnosis not present

## 2021-06-05 DIAGNOSIS — M7502 Adhesive capsulitis of left shoulder: Secondary | ICD-10-CM | POA: Diagnosis not present

## 2021-06-05 DIAGNOSIS — S46092D Other injury of muscle(s) and tendon(s) of the rotator cuff of left shoulder, subsequent encounter: Secondary | ICD-10-CM | POA: Diagnosis not present

## 2021-06-08 NOTE — Progress Notes (Deleted)
?Cardiology Office Note:   ? ?Date:  06/08/2021  ? ?ID:  Stephanie Dudley, DOB Jan 24, 1956, MRN [id number] ? ?PCP:  Stephanie Chard, MD ?  ?[facility] Providers ?Cardiologist:  Stephanie Bergeron, MD ?Cardiology APP:  Stephanie Shi, PA-C { ? ? ?Referring MD: Stephanie Chard, MD  ? ? ? ?History of Present Illness:   ? ?Stephanie Dudley is a 66 y.o. female with a hx of recurrent pericarditis with associated pericardial effusion (01/2018, 03/2018, 02/2020), HTN, HLD, mild MVP, hypothyroidism, breast cancer s/p right lumpectomy who was previously followed by Dr. Meda Dudley who now presents to clinic for follow-up. ? ?Per review of the record, the patient has had recurrent pericarditis with associated pericardial effusion. Admitted 01/2018 with idiopathic pericarditis and had poor response to NSAIDs. She was instead treated with steroid taper. She had abnormal RF but rheumatology did not feel she warranted further evaluation. Repeat pericarditis 03/2018 with longer steroid taper and Colchicine for 6 months. She had recurrent pericarditis 02/2020.  ? ?She last saw Stephanie Montana NP on 12/2020 where she was started on prednisone for recurrent chest pain.  ? ?Today, *** ? ?Past Medical History:  ?Diagnosis Date  ? Arthritis   ? Breast mass, right 08/2017  ? Benign tissue  ? Complication of anesthesia   ? hard to wake up with thyroid surgery  ? Family history of breast cancer   ? Family history of thyroid cancer   ? Family history of uterine cancer   ? Hyperlipidemia   ? Hypertension   ? Hypothyroidism   ? Mitral valve prolapse   ? a. mild by echo 04/2018.// Echocardiogram 12/21: EF 60-65, no RWMA, mild LVH, GR 1 DD, normal RVSF, mild LAE, myxomatous MV, mild MR, mild MVP, no pericardial effusion   ? Obesity   ? Pericardial effusion   ? Pericarditis   ? Peripheral edema   ? ankles, feet  ? ? ?Past Surgical History:  ?Procedure Laterality Date  ? APPENDECTOMY  1994  ? BREAST BIOPSY Left 1999  ? BREAST LUMPECTOMY WITH  RADIOACTIVE SEED LOCALIZATION Right 09/28/2017  ? Procedure: RIGHT BREAST LUMPECTOMY WITH RADIOACTIVE SEED LOCALIZATION;  Surgeon: Stephanie Luna, MD;  Location: [place];  Service: General;  Laterality: Right;  ? BREAST SURGERY    ? KNEE ARTHROSCOPY Left   ? MIDDLE EAR SURGERY Left 2011  ? THYROIDECTOMY  1974  ? goiter--total thyroidectomy  ? TONSILLECTOMY  1972  ? WRIST FRACTURE SURGERY    ? ? ?Current Medications: ?No outpatient medications have been marked as taking for the 06/11/21 encounter (Appointment) with Stephanie Bergeron, MD.  ?  ? ?Allergies:   Sulfa antibiotics  ? ?Social History  ? ?Socioeconomic History  ? Marital status: Married  ?  Spouse name: Not on file  ? Number of children: Not on file  ? Years of education: Not on file  ? Highest education level: Not on file  ?Occupational History  ? Not on file  ?Tobacco Use  ? Smoking status: Former  ?  Packs/day: 1.00  ?  Years: 15.00  ?  Pack years: 15.00  ?  Types: Cigarettes  ? Smokeless tobacco: Never  ?Vaping Use  ? Vaping Use: Never used  ?Substance and Sexual Activity  ? Alcohol use: Yes  ?  Comment: wine 2x/wk  ? Drug use: No  ? Sexual activity: Yes  ?  Birth control/protection: Post-menopausal  ?Other Topics Concern  ? Not on file  ?Social History Narrative  ?  Not on file  ? ?Social Determinants of Health  ? ?Financial Resource Strain: Not on file  ?Food Insecurity: Not on file  ?Transportation Needs: Not on file  ?Physical Activity: Not on file  ?Stress: Not on file  ?Social Connections: Not on file  ?  ? ?Family History: ?The patient's ***family history includes Breast cancer (age of onset: 64) in her sister; Heart Problems in her father; Hyperlipidemia in her father; Hypertension in her father; Thyroid cancer in her mother; Uterine cancer (age of onset: 40) in her paternal grandmother. ? ?ROS:   ?Please see the history of present illness.    ?*** All other systems reviewed and are negative. ? ?EKGs/Labs/Other Studies Reviewed:    ? ?The following studies were reviewed today: ?Echocardiogram 03/20/20 ?EF 60-65, no RWMA, mild LVH, Gr 1 DD, normal PASP, mild LAE, mild MR, mild MVP, no pericardial eff ?  ?Echocardiogram 05/04/2018 ?EF 60-65, mild LVH, GR 1 DD, normal RVSF, mild MVP, mild calcification of aortic valve, no effusion ?  ?Echocardiogram 03/29/2018 ?EF 60-65, normal wall motion, possible trivial free-flowing pericardial effusion along the RV free wall and apex ?  ?Echocardiogram 02/02/2018 ?Mild LVH, EF 65-70, no RWMA, PASP 31  ? ?EKG:  EKG is *** ordered today.  The ekg ordered today demonstrates *** ? ?Recent Labs: ?02/12/2021: ALT 23; TSH 1.650 ?02/22/2021: BUN 10; Creatinine, Ser 0.71; Hemoglobin 11.9; Platelets 283; Potassium 3.7; Sodium 137  ?Recent Lipid Panel ?   ?Component Value Date/Time  ? CHOL 218 (H) 02/12/2021 1610  ? TRIG 231 (H) 02/12/2021 9604  ? HDL 73 02/12/2021 0923  ? CHOLHDL 3.0 02/12/2021 0923  ? LDLCALC 106 (H) 02/12/2021 5409  ? ? ? ?Risk Assessment/Calculations:   ?{Does this patient have ATRIAL FIBRILLATION?:[id number]} ? ?    ? ?Physical Exam:   ? ?VS:  There were no vitals taken for this visit.   ? ?Wt Readings from Last 3 Encounters:  ?03/25/21 183 lb (83 kg)  ?03/11/21 184 lb 9.6 oz (83.7 kg)  ?02/22/21 181 lb (82.1 kg)  ?  ? ?GEN: *** Well nourished, well developed in no acute distress ?HEENT: Normal ?NECK: No JVD; No carotid bruits ?LYMPHATICS: No lymphadenopathy ?CARDIAC: ***RRR, no murmurs, rubs, gallops ?RESPIRATORY:  Clear to auscultation without rales, wheezing or rhonchi  ?ABDOMEN: Soft, non-tender, non-distended ?MUSCULOSKELETAL:  No edema; No deformity  ?SKIN: Warm and dry ?NEUROLOGIC:  Alert and oriented x 3 ?PSYCHIATRIC:  Normal affect  ? ?ASSESSMENT:   ? ?No diagnosis found. ?PLAN:   ? ?In order of problems listed above: ? ?#Idiopathic, Recurrent Pericarditis: ?Has had 3 episodes of pericarditis in 2019, 2020 and 2021 with poor response to NSAIDs. Treated with colchicine and steroids in the  past. Follows with Rheum as well.  ? ?#HTN: ?-Continue valsartan '160mg'$  daily ?-Continue amlodipine 2.'5mg'$  daily ?-Continue coreg 12.'5mg'$  BID ?-Continue spironolactone '25mg'$  daily ? ?#HLD: ?-Continue crestor '20mg'$  daily ? ?#Mild MR: ?Noted on TTE 02/2020 ?-Continue serial monitoring with next TTE 2024 unless clinical change ? ?   ? ?{Are you ordering a CV Procedure (e.g. stress test, cath, DCCV, TEE, etc)?   Press F2        :[id number]}  ? ? ?Medication Adjustments/Labs and Tests Ordered: ?Current medicines are reviewed at length with the patient today.  Concerns regarding medicines are outlined above.  ?No orders of the defined types were placed in this encounter. ? ?No orders of the defined types were placed in this encounter. ? ? ?There are no Patient  Instructions on file for this visit.  ? ?Signed, ?Stephanie Bergeron, MD  ?06/08/2021 9:10 PM    ?[place] ?

## 2021-06-10 DIAGNOSIS — S46092D Other injury of muscle(s) and tendon(s) of the rotator cuff of left shoulder, subsequent encounter: Secondary | ICD-10-CM | POA: Diagnosis not present

## 2021-06-10 DIAGNOSIS — M7502 Adhesive capsulitis of left shoulder: Secondary | ICD-10-CM | POA: Diagnosis not present

## 2021-06-10 NOTE — Progress Notes (Signed)
?Cardiology Office Note:   ? ?Date:  06/11/2021  ? ?ID:  Stephanie Dudley, DOB 08-11-1955, MRN [id number] ? ?PCP:  [place] Chard, MD ?  ?[facility] Providers ?Cardiologist:  Freada Bergeron, MD ?Cardiology APP:  Liliane Shi, PA-C  ?{ ? ? ?Referring MD: [place] Chard, MD  ? ? ? ?History of Present Illness:   ? ?Stephanie Dudley is a 66 y.o. female with a hx of recurrent pericarditis with associated pericardial effusion (01/2018, 03/2018, 02/2020), HTN, HLD, mild MVP, hypothyroidism, breast cancer s/p right lumpectomy who was previously followed by Dr. Meda Coffee who now presents to clinic for follow-up. ? ?Per review of the record, the patient has had recurrent pericarditis with associated pericardial effusion. Admitted 01/2018 with idiopathic pericarditis and had poor response to NSAIDs. She was instead treated with steroid taper. She had abnormal RF but rheumatology did not feel she warranted further evaluation. Repeat pericarditis 03/2018 with longer steroid taper and Colchicine for 6 months. She had recurrent pericarditis 02/2020.  ? ?She last saw [name] on 12/2020 where she was started on prednisone for concern for possible RA.  ? ?Today, she is doing well. She was determined to not have rheumatoid arthritis and has been taken off of prednisone. Fortunately, she has had no recurrent episodes of chest pain. She reports she is doing very well and remains active. No SOB, palpitations, lightheadedness or dizziness. She reports her bilateral feet will swell at the end of the day because she is active caring for 2 grandsons.  ? ?At home, she reports her blood pressure is typically within 275-170Y systolic. Amlodipine caused her pressure to drop as low as 78/48 and it was subsequently stopped. She denies known family of heart disease. Declined Ca score today. ? ?Past Medical History:  ?Diagnosis Date  ? Arthritis   ? Breast mass, right 08/2017  ? Benign tissue  ? Complication of anesthesia   ?  hard to wake up with thyroid surgery  ? Family history of breast cancer   ? Family history of thyroid cancer   ? Family history of uterine cancer   ? Hyperlipidemia   ? Hypertension   ? Hypothyroidism   ? Mitral valve prolapse   ? a. mild by echo 04/2018.// Echocardiogram 12/21: EF 60-65, no RWMA, mild LVH, GR 1 DD, normal RVSF, mild LAE, myxomatous MV, mild MR, mild MVP, no pericardial effusion   ? Obesity   ? Pericardial effusion   ? Pericarditis   ? Peripheral edema   ? ankles, feet  ? ? ?Past Surgical History:  ?Procedure Laterality Date  ? APPENDECTOMY  1994  ? BREAST BIOPSY Left 1999  ? BREAST LUMPECTOMY WITH RADIOACTIVE SEED LOCALIZATION Right 09/28/2017  ? Procedure: RIGHT BREAST LUMPECTOMY WITH RADIOACTIVE SEED LOCALIZATION;  Surgeon: Erroll Luna, MD;  Location: [place];  Service: General;  Laterality: Right;  ? BREAST SURGERY    ? KNEE ARTHROSCOPY Left   ? MIDDLE EAR SURGERY Left 2011  ? THYROIDECTOMY  1974  ? goiter--total thyroidectomy  ? TONSILLECTOMY  1972  ? WRIST FRACTURE SURGERY    ? ? ?Current Medications: ?Current Meds  ?Medication Sig  ? carvedilol (COREG) 12.5 MG tablet Take 1 tablet (12.5 mg total) by mouth 2 (two) times daily.  ? colchicine 0.6 MG tablet Take 1 tablet (0.6 mg total) by mouth daily.  ? escitalopram (LEXAPRO) 10 MG tablet TAKE ONE TABLET BY MOUTH DAILY  ? furosemide (LASIX) 40 MG tablet Take 40  mg by mouth daily.  ? rosuvastatin (CRESTOR) 20 MG tablet TAKE ONE TABLET BY MOUTH DAILY  ? spironolactone (ALDACTONE) 25 MG tablet TAKE ONE TABLET BY MOUTH DAILY  ? SYNTHROID 88 MCG tablet TAKE ONE TABLET BY MOUTH DAILY  ? valsartan (DIOVAN) 160 MG tablet TAKE ONE TABLET BY MOUTH DAILY  ?  ? ?Allergies:   Sulfa antibiotics  ? ?Social History  ? ?Socioeconomic History  ? Marital status: Married  ?  Spouse name: Not on file  ? Number of children: Not on file  ? Years of education: Not on file  ? Highest education level: Not on file  ?Occupational History  ? Not on file   ?Tobacco Use  ? Smoking status: Former  ?  Packs/day: 1.00  ?  Years: 15.00  ?  Pack years: 15.00  ?  Types: Cigarettes  ? Smokeless tobacco: Never  ?Vaping Use  ? Vaping Use: Never used  ?Substance and Sexual Activity  ? Alcohol use: Yes  ?  Comment: wine 2x/wk  ? Drug use: No  ? Sexual activity: Yes  ?  Birth control/protection: Post-menopausal  ?Other Topics Concern  ? Not on file  ?Social History Narrative  ? Not on file  ? ?Social Determinants of Health  ? ?Financial Resource Strain: Not on file  ?Food Insecurity: Not on file  ?Transportation Needs: Not on file  ?Physical Activity: Not on file  ?Stress: Not on file  ?Social Connections: Not on file  ?  ? ?Family History: ?The patient's family history includes Breast cancer (age of onset: 15) in her sister; Heart Problems in her father; Hyperlipidemia in her father; Hypertension in her father; Thyroid cancer in her mother; Uterine cancer (age of onset: 46) in her paternal grandmother. ? ?ROS:   ?Please see the history of present illness.    ?Review of Systems  ?Constitutional:  Negative for malaise/fatigue and weight loss.  ?HENT:  Negative for congestion and sore throat.   ?Eyes:  Negative for blurred vision.  ?Respiratory:  Negative for cough and shortness of breath.   ?Cardiovascular:  Positive for chest pain and leg swelling (Bilateral). Negative for palpitations, orthopnea, claudication and PND.  ?Gastrointestinal:  Negative for heartburn and nausea.  ?Genitourinary:  Negative for dysuria and urgency.  ?Musculoskeletal:  Negative for joint pain and myalgias.  ?Skin:  Negative for itching and rash.  ?Neurological:  Negative for dizziness and headaches.  ?Endo/Heme/Allergies:  Does not bruise/bleed easily.  ?Psychiatric/Behavioral:  The patient is not nervous/anxious and does not have insomnia.   ?All other systems reviewed and are negative. ? ?EKGs/Labs/Other Studies Reviewed:   ? ?The following studies were reviewed today: ?Echocardiogram 03/20/20 ?EF  60-65, no RWMA, mild LVH, Gr 1 DD, normal PASP, mild LAE, mild MR, mild MVP, no pericardial eff ?  ?Echocardiogram 05/04/2018 ?EF 60-65, mild LVH, GR 1 DD, normal RVSF, mild MVP, mild calcification of aortic valve, no effusion ?  ?Echocardiogram 03/29/2018 ?EF 60-65, normal wall motion, possible trivial free-flowing pericardial effusion along the RV free wall and apex ?  ?Echocardiogram 02/02/2018 ?Mild LVH, EF 65-70, no RWMA, PASP 31  ? ?EKG: EKG was not ordered today ? ?Recent Labs: ?02/12/2021: ALT 23; TSH 1.650 ?02/22/2021: BUN 10; Creatinine, Ser 0.71; Hemoglobin 11.9; Platelets 283; Potassium 3.7; Sodium 137  ?Recent Lipid Panel ?   ?Component Value Date/Time  ? CHOL 218 (H) 02/12/2021 0865  ? TRIG 231 (H) 02/12/2021 7846  ? HDL 73 02/12/2021 0923  ? CHOLHDL 3.0 02/12/2021  5809  ? [place] 106 (H) 02/12/2021 9833  ? ? ? ?Risk Assessment/Calculations:   ?  ? ?    ? ?Physical Exam:   ? ?VS:  BP (!) 144/90   Pulse 74   Ht '5\' 6"'$  (1.676 m)   Wt 183 lb 9.6 oz (83.3 kg)   SpO2 98%   BMI 29.63 kg/m?    ? ?Wt Readings from Last 3 Encounters:  ?06/11/21 183 lb 9.6 oz (83.3 kg)  ?03/25/21 183 lb (83 kg)  ?03/11/21 184 lb 9.6 oz (83.7 kg)  ?  ? ?GEN: Well nourished, well developed in no acute distress ?HEENT: Normal ?NECK: No JVD; No carotid bruits ?LYMPHATICS: No lymphadenopathy ?CARDIAC: RRR, 1/6 systolic murmur, no rubs or gallops ?RESPIRATORY:  Clear to auscultation without rales, wheezing or rhonchi  ?ABDOMEN: Soft, non-tender, non-distended ?MUSCULOSKELETAL:  No edema; No deformity  ?SKIN: Warm and dry ?NEUROLOGIC:  Alert and oriented x 3 ?PSYCHIATRIC:  Normal affect  ? ?ASSESSMENT:   ? ?1. Idiopathic pericarditis, unspecified chronicity   ?2. Essential hypertension   ?3. Mixed hyperlipidemia   ?4. Mild mitral regurgitation   ? ?PLAN:   ? ?In order of problems listed above: ? ?#Idiopathic, Recurrent Pericarditis: ?Has had 3 episodes of pericarditis in 2019, 2020 and 2021 with poor response to NSAIDs. Treated with  colchicine and steroids in the past. Unclear etiology but fortunately has not had recurrence since 2021. Has been maintained on colchicine. ?-Continue colchicine 0.'6mg'$  daily ? ?#HTN: ?Running 120-140s at home. Did n

## 2021-06-11 ENCOUNTER — Other Ambulatory Visit: Payer: Self-pay

## 2021-06-11 ENCOUNTER — Ambulatory Visit: Payer: Federal, State, Local not specified - PPO | Admitting: Cardiology

## 2021-06-11 ENCOUNTER — Encounter: Payer: Self-pay | Admitting: Cardiology

## 2021-06-11 VITALS — BP 144/90 | HR 74 | Ht 66.0 in | Wt 183.6 lb

## 2021-06-11 DIAGNOSIS — I10 Essential (primary) hypertension: Secondary | ICD-10-CM

## 2021-06-11 DIAGNOSIS — E782 Mixed hyperlipidemia: Secondary | ICD-10-CM

## 2021-06-11 DIAGNOSIS — I300 Acute nonspecific idiopathic pericarditis: Secondary | ICD-10-CM

## 2021-06-11 DIAGNOSIS — I340 Nonrheumatic mitral (valve) insufficiency: Secondary | ICD-10-CM

## 2021-06-11 NOTE — Patient Instructions (Signed)
Medication Instructions:  ? ?Your physician recommends that you continue on your current medications as directed. Please refer to the Current Medication list given to you today. ? ?*If you need a refill on your cardiac medications before your next appointment, please call your pharmacy* ? ? ?Follow-Up: ?At [facility], you and your health needs are our priority.  As part of our continuing mission to provide you with exceptional heart care, we have created designated Provider Care Teams.  These Care Teams include your primary Cardiologist (physician) and Advanced Practice Providers (APPs -  Physician Assistants and Nurse Practitioners) who all work together to provide you with the care you need, when you need it. ? ?We recommend signing up for the patient portal called "MyChart".  Sign up information is provided on this After Visit Summary.  MyChart is used to connect with patients for Virtual Visits (Telemedicine).  Patients are able to view lab/test results, encounter notes, upcoming appointments, etc.  Non-urgent messages can be sent to your provider as well.   ?To learn more about what you can do with MyChart, go to [email or web address].   ? ?Your next appointment:   ?6 month(s) ? ?The format for your next appointment:   ?In Person ? ?Provider:   ?Freada Bergeron, MD { ? ? ? ?

## 2021-06-12 DIAGNOSIS — S46092D Other injury of muscle(s) and tendon(s) of the rotator cuff of left shoulder, subsequent encounter: Secondary | ICD-10-CM | POA: Diagnosis not present

## 2021-06-12 DIAGNOSIS — M7502 Adhesive capsulitis of left shoulder: Secondary | ICD-10-CM | POA: Diagnosis not present

## 2021-06-17 ENCOUNTER — Other Ambulatory Visit: Payer: Self-pay | Admitting: Internal Medicine

## 2021-07-26 ENCOUNTER — Other Ambulatory Visit (HOSPITAL_BASED_OUTPATIENT_CLINIC_OR_DEPARTMENT_OTHER): Payer: Self-pay | Admitting: Family

## 2021-07-26 DIAGNOSIS — I300 Acute nonspecific idiopathic pericarditis: Secondary | ICD-10-CM

## 2021-07-28 ENCOUNTER — Encounter: Payer: Self-pay | Admitting: Cardiology

## 2021-07-28 NOTE — Telephone Encounter (Signed)
Rx(s) sent to pharmacy electronically.  

## 2021-08-20 ENCOUNTER — Ambulatory Visit: Payer: Federal, State, Local not specified - PPO | Admitting: Internal Medicine

## 2021-08-20 ENCOUNTER — Encounter: Payer: Self-pay | Admitting: Internal Medicine

## 2021-08-20 VITALS — BP 110/72 | HR 68 | Temp 98.1°F | Ht 64.6 in | Wt 182.4 lb

## 2021-08-20 DIAGNOSIS — I10 Essential (primary) hypertension: Secondary | ICD-10-CM

## 2021-08-20 DIAGNOSIS — E890 Postprocedural hypothyroidism: Secondary | ICD-10-CM | POA: Diagnosis not present

## 2021-08-20 DIAGNOSIS — Z79899 Other long term (current) drug therapy: Secondary | ICD-10-CM | POA: Diagnosis not present

## 2021-08-20 DIAGNOSIS — E6609 Other obesity due to excess calories: Secondary | ICD-10-CM

## 2021-08-20 DIAGNOSIS — Z23 Encounter for immunization: Secondary | ICD-10-CM

## 2021-08-20 DIAGNOSIS — E7800 Pure hypercholesterolemia, unspecified: Secondary | ICD-10-CM | POA: Diagnosis not present

## 2021-08-20 DIAGNOSIS — Z6830 Body mass index (BMI) 30.0-30.9, adult: Secondary | ICD-10-CM

## 2021-08-20 MED ORDER — ESCITALOPRAM OXALATE 10 MG PO TABS: 10.0000 mg | ORAL_TABLET | Freq: Every day | ORAL | 1 refills | Status: DC

## 2021-08-20 MED ORDER — ROSUVASTATIN CALCIUM 20 MG PO TABS: 20.0000 mg | ORAL_TABLET | Freq: Every day | ORAL | 1 refills | Status: DC

## 2021-08-20 NOTE — Patient Instructions (Signed)
Hypertension, Adult ?Hypertension is another name for high blood pressure. High blood pressure forces your heart to work harder to pump blood. This can cause problems over time. ?There are two numbers in a blood pressure reading. There is a top number (systolic) over a bottom number (diastolic). It is best to have a blood pressure that is below 120/80. ?What are the causes? ?The cause of this condition is not known. Some other conditions can lead to high blood pressure. ?What increases the risk? ?Some lifestyle factors can make you more likely to develop high blood pressure: ?Smoking. ?Not getting enough exercise or physical activity. ?Being overweight. ?Having too much fat, sugar, calories, or salt (sodium) in your diet. ?Drinking too much alcohol. ?Other risk factors include: ?Having any of these conditions: ?Heart disease. ?Diabetes. ?High cholesterol. ?Kidney disease. ?Obstructive sleep apnea. ?Having a family history of high blood pressure and high cholesterol. ?Age. The risk increases with age. ?Stress. ?What are the signs or symptoms? ?High blood pressure may not cause symptoms. Very high blood pressure (hypertensive crisis) may cause: ?Headache. ?Fast or uneven heartbeats (palpitations). ?Shortness of breath. ?Nosebleed. ?Vomiting or feeling like you may vomit (nauseous). ?Changes in how you see. ?Very bad chest pain. ?Feeling dizzy. ?Seizures. ?How is this treated? ?This condition is treated by making healthy lifestyle changes, such as: ?Eating healthy foods. ?Exercising more. ?Drinking less alcohol. ?Your doctor may prescribe medicine if lifestyle changes do not help enough and if: ?Your top number is above 130. ?Your bottom number is above 80. ?Your personal target blood pressure may vary. ?Follow these instructions at home: ?Eating and drinking ? ?If told, follow the DASH eating plan. To follow this plan: ?Fill one half of your plate at each meal with fruits and vegetables. ?Fill one fourth of your plate  at each meal with whole grains. Whole grains include whole-wheat pasta, brown rice, and whole-grain bread. ?Eat or drink low-fat dairy products, such as skim milk or low-fat yogurt. ?Fill one fourth of your plate at each meal with low-fat (lean) proteins. Low-fat proteins include fish, chicken without skin, eggs, beans, and tofu. ?Avoid fatty meat, cured and processed meat, or chicken with skin. ?Avoid pre-made or processed food. ?Limit the amount of salt in your diet to less than 1,500 mg each day. ?Do not drink alcohol if: ?Your doctor tells you not to drink. ?You are pregnant, may be pregnant, or are planning to become pregnant. ?If you drink alcohol: ?Limit how much you have to: ?0-1 drink a day for women. ?0-2 drinks a day for men. ?Know how much alcohol is in your drink. In the [place] one drink equals one 12 oz bottle of beer (355 mL), one 5 oz glass of wine (148 mL), or one 1? oz glass of hard liquor (44 mL). ?Lifestyle ? ?Work with your doctor to stay at a healthy weight or to lose weight. Ask your doctor what the best weight is for you. ?Get at least 30 minutes of exercise that causes your heart to beat faster (aerobic exercise) most days of the week. This may include walking, swimming, or biking. ?Get at least 30 minutes of exercise that strengthens your muscles (resistance exercise) at least 3 days a week. This may include lifting weights or doing Pilates. ?Do not smoke or use any products that contain nicotine or tobacco. If you need help quitting, ask your doctor. ?Check your blood pressure at home as told by your doctor. ?Keep all follow-up visits. ?Medicines ?Take over-the-counter and prescription medicines   only as told by your doctor. Follow directions carefully. ?Do not skip doses of blood pressure medicine. The medicine does not work as well if you skip doses. Skipping doses also puts you at risk for problems. ?Ask your doctor about side effects or reactions to medicines that you should watch  for. ?Contact a doctor if: ?You think you are having a reaction to the medicine you are taking. ?You have headaches that keep coming back. ?You feel dizzy. ?You have swelling in your ankles. ?You have trouble with your vision. ?Get help right away if: ?You get a very bad headache. ?You start to feel mixed up (confused). ?You feel weak or numb. ?You feel faint. ?You have very bad pain in your: ?Chest. ?Belly (abdomen). ?You vomit more than once. ?You have trouble breathing. ?These symptoms may be an emergency. Get help right away. Call 911. ?Do not wait to see if the symptoms will go away. ?Do not drive yourself to the hospital. ?Summary ?Hypertension is another name for high blood pressure. ?High blood pressure forces your heart to work harder to pump blood. ?For most people, a normal blood pressure is less than 120/80. ?Making healthy choices can help lower blood pressure. If your blood pressure does not get lower with healthy choices, you may need to take medicine. ?This information is not intended to replace advice given to you by your health care provider. Make sure you discuss any questions you have with your health care provider. ?Document Revised: 12/26/2020 Document Reviewed: 12/26/2020 ?Elsevier Patient Education ? 2023 [employer] ? ?

## 2021-08-20 NOTE — Progress Notes (Signed)
Rich Brave [place]leton,acting as a Education administrator for Maximino Greenland, MD.,have documented all relevant documentation on the behalf of Maximino Greenland, MD,as directed by  Maximino Greenland, MD while in the presence of Maximino Greenland, MD.  This visit occurred during the SARS-CoV-2 public health emergency.  Safety protocols were in place, including screening questions prior to the visit, additional usage of staff PPE, and extensive cleaning of exam room while observing appropriate contact time as indicated for disinfecting solutions.  Subjective:     Patient ID: Stephanie Dudley , female    DOB: 1955-06-22 , 67 y.o.   MRN: [id number]   Chief Complaint  Patient presents with  . Hypertension    HPI  She is here today for a full physical examination.  She is followed by her Eunice for her pelvic exams.  She denies having headaches, chest pain and shortness of breath.  Hypertension This is a chronic problem. The current episode started more than 1 year ago. The problem has been gradually improving since onset. The problem is uncontrolled. Pertinent negatives include no blurred vision, chest pain, palpitations or shortness of breath. Risk factors for coronary artery disease include obesity, sedentary lifestyle and post-menopausal state. Past treatments include beta blockers. The current treatment provides moderate improvement. Compliance problems include exercise.     Past Medical History:  Diagnosis Date  . Arthritis   . Breast mass, right 08/2017   Benign tissue  . Complication of anesthesia    hard to wake up with thyroid surgery  . Family history of breast cancer   . Family history of thyroid cancer   . Family history of uterine cancer   . Hyperlipidemia   . Hypertension   . Hypothyroidism   . Mitral valve prolapse    a. mild by echo 04/2018.// Echocardiogram 12/21: EF 60-65, no RWMA, mild LVH, GR 1 DD, normal RVSF, mild LAE, myxomatous MV, mild MR, mild MVP, no pericardial effusion    . Obesity   . Pericardial effusion   . Pericarditis   . Peripheral edema    ankles, feet     Family History  Problem Relation Age of Onset  . Breast cancer Sister 84  . Thyroid cancer Mother   . Hypertension Father   . Hyperlipidemia Father   . Heart Problems Father   . Uterine cancer Paternal Grandmother 70     Current Outpatient Medications:  .  carvedilol (COREG) 12.5 MG tablet, Take 1 tablet (12.5 mg total) by mouth 2 (two) times daily., Disp: 180 tablet, Rfl: 3 .  colchicine 0.6 MG tablet, TAKE ONE TABLET BY MOUTH DAILY, Disp: 90 tablet, Rfl: 0 .  furosemide (LASIX) 40 MG tablet, TAKE ONE TABLET BY MOUTH EVERY MORNING AND ONE-HALF TABLET IN THE AFTERNOON, Disp: 135 tablet, Rfl: 1 .  meclizine (ANTIVERT) 25 MG tablet, Take 1 tablet (25 mg total) by mouth 3 (three) times daily as needed for dizziness., Disp: 30 tablet, Rfl: 0 .  spironolactone (ALDACTONE) 25 MG tablet, TAKE ONE TABLET BY MOUTH DAILY, Disp: 90 tablet, Rfl: 1 .  SYNTHROID 88 MCG tablet, TAKE ONE TABLET BY MOUTH DAILY, Disp: 90 tablet, Rfl: 1 .  valsartan (DIOVAN) 160 MG tablet, TAKE ONE TABLET BY MOUTH DAILY, Disp: 90 tablet, Rfl: 1 .  escitalopram (LEXAPRO) 10 MG tablet, Take 1 tablet (10 mg total) by mouth daily., Disp: 90 tablet, Rfl: 1 .  rosuvastatin (CRESTOR) 20 MG tablet, Take 1 tablet (20 mg total) by mouth daily.,  Disp: 90 tablet, Rfl: 1   Allergies  Allergen Reactions  . Sulfa Antibiotics Swelling     Review of Systems  Constitutional: Negative.   Eyes:  Negative for blurred vision.  Respiratory: Negative.  Negative for shortness of breath.   Cardiovascular: Negative.  Negative for chest pain and palpitations.  Neurological: Negative.   Psychiatric/Behavioral: Negative.      Today's Vitals   08/20/21 0848  BP: 110/72  Pulse: 68  Temp: 98.1 F (36.7 C)  Weight: 182 lb 6.4 oz (82.7 kg)  Height: 5' 4.6" (1.641 m)  PainSc: 0-No pain   Body mass index is 30.73 kg/m.  Wt Readings from Last  3 Encounters:  08/20/21 182 lb 6.4 oz (82.7 kg)  06/11/21 183 lb 9.6 oz (83.3 kg)  03/25/21 183 lb (83 kg)     Objective:  Physical Exam Vitals and nursing note reviewed.  Constitutional:      Appearance: Normal appearance.  HENT:     Head: Normocephalic and atraumatic.  Eyes:     Extraocular Movements: Extraocular movements intact.  Cardiovascular:     Rate and Rhythm: Normal rate and regular rhythm.     Heart sounds: Normal heart sounds.  Pulmonary:     Effort: Pulmonary effort is normal.     Breath sounds: Normal breath sounds.  Musculoskeletal:     Cervical back: Normal range of motion.  Skin:    General: Skin is warm.  Neurological:     General: No focal deficit present.     Mental Status: She is alert.  Psychiatric:        Mood and Affect: Mood normal.        Behavior: Behavior normal.        Assessment And Plan:     1. Essential hypertension Comments: Chronic, well controlled. She is encouraged to follow low sodium diet.  - CBC no Diff - CMP14+EGFR - Lipid panel  2. Pure hypercholesterolemia Comments: Chronic, currently on rosuvastatin. Pt advised of interaction w/ colchicine. She does admit to myalgias. I will check CPK today.  - CMP14+EGFR - Lipid panel  3. Postoperative hypothyroidism Comments: I will check thyroid panel and adjust meds as needed.  - TSH + free T4  4. Class 1 obesity due to excess calories with serious comorbidity and body mass index (BMI) of 30.0 to 30.9 in adult Comments: She is encouraged to aim for at least 150 minutes of exercise per week.   5. Immunization due - Varicella-zoster vaccine IM (Shingrix)  6. Drug therapy - CK, total   Patient was given opportunity to ask questions. Patient verbalized understanding of the plan and was able to repeat key elements of the plan. All questions were answered to their satisfaction.   I, Maximino Greenland, MD, have reviewed all documentation for this visit. The documentation on 08/20/21  for the exam, diagnosis, procedures, and orders are all accurate and complete.   IF YOU HAVE BEEN REFERRED TO A SPECIALIST, IT MAY TAKE 1-2 WEEKS TO SCHEDULE/PROCESS THE REFERRAL. IF YOU HAVE NOT HEARD FROM US/SPECIALIST IN TWO WEEKS, PLEASE GIVE [place] A CALL AT [phone].   THE PATIENT IS ENCOURAGED TO PRACTICE SOCIAL DISTANCING DUE TO THE COVID-19 PANDEMIC.

## 2021-08-21 LAB — CMP14+EGFR
ALT: 22 IU/L (ref 0–32)
AST: 28 IU/L (ref 0–40)
Albumin/Globulin Ratio: 2.1 (ref 1.2–2.2)
Albumin: 4.6 g/dL (ref 3.8–4.8)
Alkaline Phosphatase: 77 IU/L (ref 44–121)
BUN/Creatinine Ratio: 16 (ref 12–28)
BUN: 12 mg/dL (ref 8–27)
Bilirubin Total: 0.9 mg/dL (ref 0.0–1.2)
CO2: 23 mmol/L (ref 20–29)
Calcium: 9.5 mg/dL (ref 8.7–10.3)
Chloride: 101 mmol/L (ref 96–106)
Creatinine, Ser: 0.74 mg/dL (ref 0.57–1.00)
Globulin, Total: 2.2 g/dL (ref 1.5–4.5)
Glucose: 98 mg/dL (ref 70–99)
Potassium: 4.1 mmol/L (ref 3.5–5.2)
Sodium: 142 mmol/L (ref 134–144)
Total Protein: 6.8 g/dL (ref 6.0–8.5)
eGFR: 90 mL/min/{1.73_m2} (ref >59)

## 2021-08-21 LAB — LIPID PANEL
Chol/HDL Ratio: 3.3 ratio (ref 0.0–4.4)
Cholesterol, Total: 203 mg/dL — ABNORMAL HIGH (ref 100–199)
HDL: 62 mg/dL (ref >39)
LDL Chol Calc (NIH): 103 mg/dL — ABNORMAL HIGH (ref 0–99)
Triglycerides: 224 mg/dL — ABNORMAL HIGH (ref 0–149)
VLDL Cholesterol Cal: 38 mg/dL (ref 5–40)

## 2021-08-21 LAB — CBC
Hematocrit: 36.7 % (ref 34.0–46.6)
Hemoglobin: 12.6 g/dL (ref 11.1–15.9)
MCH: 31.2 pg (ref 26.6–33.0)
MCHC: 34.3 g/dL (ref 31.5–35.7)
MCV: 91 fL (ref 79–97)
Platelets: 222 10*3/uL (ref 150–450)
RBC: 4.04 x10E6/uL (ref 3.77–5.28)
RDW: 12.5 % (ref 11.7–15.4)
WBC: 5.6 10*3/uL (ref 3.4–10.8)

## 2021-08-21 LAB — CK: Total CK: 178 U/L (ref 32–182)

## 2021-08-21 LAB — TSH+FREE T4
Free T4: 1.42 ng/dL (ref 0.82–1.77)
TSH: 2.02 u[IU]/mL (ref 0.450–4.500)

## 2021-08-23 ENCOUNTER — Other Ambulatory Visit: Payer: Self-pay | Admitting: Physician Assistant

## 2021-09-15 ENCOUNTER — Other Ambulatory Visit: Payer: Self-pay | Admitting: Internal Medicine

## 2021-09-28 ENCOUNTER — Encounter: Payer: Self-pay | Admitting: Internal Medicine

## 2021-09-29 ENCOUNTER — Other Ambulatory Visit: Payer: Self-pay | Admitting: Internal Medicine

## 2021-09-29 MED ORDER — FAMOTIDINE 20 MG PO TABS: 20.0000 mg | ORAL_TABLET | Freq: Every day | ORAL | 1 refills | Status: DC

## 2021-10-03 ENCOUNTER — Other Ambulatory Visit: Payer: Self-pay | Admitting: Internal Medicine

## 2021-10-03 DIAGNOSIS — Z1231 Encounter for screening mammogram for malignant neoplasm of breast: Secondary | ICD-10-CM

## 2021-10-20 DIAGNOSIS — S93401A Sprain of unspecified ligament of right ankle, initial encounter: Secondary | ICD-10-CM | POA: Diagnosis not present

## 2021-10-20 DIAGNOSIS — M25571 Pain in right ankle and joints of right foot: Secondary | ICD-10-CM | POA: Diagnosis not present

## 2021-10-26 ENCOUNTER — Other Ambulatory Visit (HOSPITAL_BASED_OUTPATIENT_CLINIC_OR_DEPARTMENT_OTHER): Payer: Self-pay | Admitting: Family

## 2021-10-26 DIAGNOSIS — I300 Acute nonspecific idiopathic pericarditis: Secondary | ICD-10-CM

## 2021-10-27 ENCOUNTER — Other Ambulatory Visit (HOSPITAL_BASED_OUTPATIENT_CLINIC_OR_DEPARTMENT_OTHER): Payer: Self-pay | Admitting: Family

## 2021-10-27 DIAGNOSIS — I300 Acute nonspecific idiopathic pericarditis: Secondary | ICD-10-CM

## 2021-10-27 MED ORDER — COLCHICINE 0.6 MG PO TABS: 0.6000 mg | ORAL_TABLET | Freq: Every day | ORAL | 1 refills | Status: DC

## 2021-10-28 DIAGNOSIS — H2513 Age-related nuclear cataract, bilateral: Secondary | ICD-10-CM | POA: Diagnosis not present

## 2021-10-28 DIAGNOSIS — H5213 Myopia, bilateral: Secondary | ICD-10-CM | POA: Diagnosis not present

## 2021-10-28 DIAGNOSIS — H524 Presbyopia: Secondary | ICD-10-CM | POA: Diagnosis not present

## 2021-11-05 DIAGNOSIS — Z0142 Encounter for cervical smear to confirm findings of recent normal smear following initial abnormal smear: Secondary | ICD-10-CM | POA: Diagnosis not present

## 2021-11-05 DIAGNOSIS — Z01419 Encounter for gynecological examination (general) (routine) without abnormal findings: Secondary | ICD-10-CM | POA: Diagnosis not present

## 2021-11-05 DIAGNOSIS — Z6831 Body mass index (BMI) 31.0-31.9, adult: Secondary | ICD-10-CM | POA: Diagnosis not present

## 2021-11-12 DIAGNOSIS — Z780 Asymptomatic menopausal state: Secondary | ICD-10-CM | POA: Diagnosis not present

## 2021-11-12 DIAGNOSIS — M8588 Other specified disorders of bone density and structure, other site: Secondary | ICD-10-CM | POA: Diagnosis not present

## 2021-11-12 LAB — HM DEXA SCAN

## 2021-11-13 LAB — HM PAP SMEAR

## 2021-11-14 ENCOUNTER — Ambulatory Visit
Admission: RE | Admit: 2021-11-14 | Discharge: 2021-11-14 | Disposition: A | Discharge status: Home / Self Care | Payer: Federal, State, Local not specified - PPO | Source: Ambulatory Visit | Attending: Internal Medicine | Admitting: Internal Medicine

## 2021-11-14 DIAGNOSIS — Z1231 Encounter for screening mammogram for malignant neoplasm of breast: Secondary | ICD-10-CM

## 2021-11-23 NOTE — Progress Notes (Unsigned)
Cardiology Office Note:    Date:  11/23/2021   ID:  Stephanie Dudley, DOB June 29, 1955, MRN [id number]  PCP:  Glendale Chard, MD   [facility] Providers Cardiologist:  Freada Bergeron, MD Cardiology APP:  Sharmon Revere  {   Referring MD: Glendale Chard, MD     History of Present Illness:    Stephanie Dudley is a 66 y.o. female with a hx of recurrent pericarditis with associated pericardial effusion (01/2018, 03/2018, 02/2020), HTN, HLD, mild MVP, hypothyroidism, breast cancer s/p right lumpectomy who was previously followed by Dr. Meda Dudley who now presents to clinic for follow-up.  Per review of the record, the patient has had recurrent pericarditis with associated pericardial effusion. Admitted 01/2018 with idiopathic pericarditis and had poor response to NSAIDs. She was instead treated with steroid taper. She had abnormal RF but rheumatology did not feel she warranted further evaluation. Repeat pericarditis 03/2018 with longer steroid taper and Colchicine for 6 months. She had recurrent pericarditis 02/2020.   She last saw Stephanie Dudley on 12/2020 where she was started on prednisone for concern for possible RA.   Was last seen 06/11/2021 where she was doing very well from a CV standpoint. Amlodipine was stopped due to hypotension. Declined Ca score.   Today, ***  Past Medical History:  Diagnosis Date   Arthritis    Breast mass, right 08/2017   Benign tissue   Complication of anesthesia    hard to wake up with thyroid surgery   Family history of breast cancer    Family history of thyroid cancer    Family history of uterine cancer    Hyperlipidemia    Hypertension    Hypothyroidism    Mitral valve prolapse    a. mild by echo 04/2018.// Echocardiogram 12/21: EF 60-65, no RWMA, mild LVH, GR 1 DD, normal RVSF, mild LAE, myxomatous MV, mild MR, mild MVP, no pericardial effusion    Obesity    Pericardial effusion    Pericarditis    Peripheral edema     ankles, feet    Past Surgical History:  Procedure Laterality Date   APPENDECTOMY  1994   BREAST BIOPSY Left 1999   BREAST LUMPECTOMY WITH RADIOACTIVE SEED LOCALIZATION Right 09/28/2017   Procedure: RIGHT BREAST LUMPECTOMY WITH RADIOACTIVE SEED LOCALIZATION;  Surgeon: Erroll Luna, MD;  Location: [facility];  Service: General;  Laterality: Right;   BREAST SURGERY     KNEE ARTHROSCOPY Left    MIDDLE EAR SURGERY Left 2011   THYROIDECTOMY  1974   goiter--total thyroidectomy   TONSILLECTOMY  1972   WRIST FRACTURE SURGERY      Current Medications: No outpatient medications have been marked as taking for the 12/01/21 encounter (Appointment) with Freada Bergeron, MD.     Allergies:   Sulfa antibiotics   Social History   Socioeconomic History   Marital status: Married    Spouse name: Not on file   Number of children: Not on file   Years of education: Not on file   Highest education level: Not on file  Occupational History   Not on file  Tobacco Use   Smoking status: Former    Packs/day: 1.00    Years: 15.00    Total pack years: 15.00    Types: Cigarettes   Smokeless tobacco: Never  Vaping Use   Vaping Use: Never used  Substance and Sexual Activity   Alcohol use: Yes    Comment: wine 2x/wk  Drug use: No   Sexual activity: Yes    Birth control/protection: Post-menopausal  Other Topics Concern   Not on file  Social History Narrative   Not on file   Social Determinants of Health   Financial Resource Strain: Not on file  Food Insecurity: Not on file  Transportation Needs: Not on file  Physical Activity: Not on file  Stress: Not on file  Social Connections: Not on file     Family History: The patient's family history includes Breast cancer (age of onset: 3) in her sister; Heart Problems in her father; Hyperlipidemia in her father; Hypertension in her father; Thyroid cancer in her mother; Uterine cancer (age of onset: 71) in her paternal  grandmother.  ROS:   Please see the history of present illness.    Review of Systems  Constitutional:  Negative for malaise/fatigue and weight loss.  HENT:  Negative for congestion and sore throat.   Eyes:  Negative for blurred vision.  Respiratory:  Negative for cough and shortness of breath.   Cardiovascular:  Positive for chest pain and leg swelling (Bilateral). Negative for palpitations, orthopnea, claudication and PND.  Gastrointestinal:  Negative for heartburn and nausea.  Genitourinary:  Negative for dysuria and urgency.  Musculoskeletal:  Negative for joint pain and myalgias.  Skin:  Negative for itching and rash.  Neurological:  Negative for dizziness and headaches.  Endo/Heme/Allergies:  Does not bruise/bleed easily.  Psychiatric/Behavioral:  The patient is not nervous/anxious and does not have insomnia.    All other systems reviewed and are negative.  EKGs/Labs/Other Studies Reviewed:    The following studies were reviewed today: Echocardiogram 03/20/20 EF 60-65, no RWMA, mild LVH, Gr 1 DD, normal PASP, mild LAE, mild MR, mild MVP, no pericardial eff   Echocardiogram 05/04/2018 EF 60-65, mild LVH, GR 1 DD, normal RVSF, mild MVP, mild calcification of aortic valve, no effusion   Echocardiogram 03/29/2018 EF 60-65, normal wall motion, possible trivial free-flowing pericardial effusion along the RV free wall and apex   Echocardiogram 02/02/2018 Mild LVH, EF 65-70, no RWMA, PASP 31   EKG: EKG was not ordered today  Recent Labs: 08/20/2021: ALT 22; BUN 12; Creatinine, Ser 0.74; Hemoglobin 12.6; Platelets 222; Potassium 4.1; Sodium 142; TSH 2.020  Recent Lipid Panel    Component Value Date/Time   CHOL 203 (H) 08/20/2021 0929   TRIG 224 (H) 08/20/2021 0929   HDL 62 08/20/2021 0929   CHOLHDL 3.3 08/20/2021 0929   LDLCALC 103 (H) 08/20/2021 0929     Risk Assessment/Calculations:           Physical Exam:    VS:  There were no vitals taken for this visit.    Wt  Readings from Last 3 Encounters:  08/20/21 182 lb 6.4 oz (82.7 kg)  06/11/21 183 lb 9.6 oz (83.3 kg)  03/25/21 183 lb (83 kg)     GEN: Well nourished, well developed in no acute distress HEENT: Normal NECK: No JVD; No carotid bruits LYMPHATICS: No lymphadenopathy CARDIAC: RRR, 1/6 systolic murmur, no rubs or gallops RESPIRATORY:  Clear to auscultation without rales, wheezing or rhonchi  ABDOMEN: Soft, non-tender, non-distended MUSCULOSKELETAL:  No edema; No deformity  SKIN: Warm and dry NEUROLOGIC:  Alert and oriented x 3 PSYCHIATRIC:  Normal affect   ASSESSMENT:    No diagnosis found.  PLAN:    In order of problems listed above:  #Idiopathic, Recurrent Pericarditis: Has had 3 episodes of pericarditis in 2019, 2020 and 2021 with poor response  to NSAIDs. Treated with colchicine and steroids in the past. Unclear etiology but fortunately has not had recurrence since 2021. Has been maintained on colchicine. -Continue colchicine 0.'6mg'$  daily  #HTN: Running 120-140s at home. Did not tolerate low dose amlodipine due to significant hypotension. Will continue current meds for now and lifestyle modifications. -Continue valsartan '160mg'$  daily -Continue coreg 12.'5mg'$  BID -Continue spironolactone '25mg'$  daily  #HLD: -Continue crestor '20mg'$  daily -Declined Ca score  -Will continue with lifestyle modifications and repeat lipids at next visit  #Mild MR: Noted on TTE 02/2020 -Continue serial monitoring with next TTE 2024 unless clinical change  Exercise recommendations: Goal of exercising for at least 30 minutes a day, at least 5 times per week.  Please exercise to a moderate exertion.  This means that while exercising it is difficult to speak in full sentences, however you are not so short of breath that you feel you must stop, and not so comfortable that you can carry on a full conversation.  Exertion level should be approximately a 5/10, if 10 is the most exertion you can perform.  Diet  recommendations: Recommend a heart healthy diet such as the Mediterranean diet.  This diet consists of plant based foods, healthy fats, lean meats, olive oil.  It suggests limiting the intake of simple carbohydrates such as white breads, pastries, and pastas.  It also limits the amount of red meat, wine, and dairy products such as cheese that one should consume on a daily basis.       Medication Adjustments/Labs and Tests Ordered: Current medicines are reviewed at length with the patient today.  Concerns regarding medicines are outlined above.  No orders of the defined types were placed in this encounter.  No orders of the defined types were placed in this encounter.   There are no Patient Instructions on file for this visit.   Signed, Freada Bergeron, MD  11/23/2021 8:21 PM    [facility]

## 2021-11-24 ENCOUNTER — Other Ambulatory Visit: Payer: Self-pay | Admitting: Internal Medicine

## 2021-11-25 ENCOUNTER — Ambulatory Visit (INDEPENDENT_AMBULATORY_CARE_PROVIDER_SITE_OTHER): Payer: Federal, State, Local not specified - PPO

## 2021-11-25 DIAGNOSIS — Z23 Encounter for immunization: Secondary | ICD-10-CM

## 2021-11-25 NOTE — Progress Notes (Signed)
I,Tashawnda Bleiler J Halbert Jesson,acting as a Education administrator for TIMA-NURSE.,have documented all relevant documentation on the behalf of TIMA-NURSE,as directed by  [facility] while in the presence of TIMA-NURSE.    Subjective:     Patient ID: Stephanie Dudley , female    DOB: 06-06-1955 , 66 y.o.   MRN: [id number]   No chief complaint on file.   HPI  Patient presents today 2nd shingles vaccine.      Past Medical History:  Diagnosis Date   Arthritis    Breast mass, right 08/2017   Benign tissue   Complication of anesthesia    hard to wake up with thyroid surgery   Family history of breast cancer    Family history of thyroid cancer    Family history of uterine cancer    Hyperlipidemia    Hypertension    Hypothyroidism    Mitral valve prolapse    a. mild by echo 04/2018.// Echocardiogram 12/21: EF 60-65, no RWMA, mild LVH, GR 1 DD, normal RVSF, mild LAE, myxomatous MV, mild MR, mild MVP, no pericardial effusion    Obesity    Pericardial effusion    Pericarditis    Peripheral edema    ankles, feet     Family History  Problem Relation Age of Onset   Breast cancer Sister 33   Thyroid cancer Mother    Hypertension Father    Hyperlipidemia Father    Heart Problems Father    Uterine cancer Paternal Grandmother 3     Current Outpatient Medications:    carvedilol (COREG) 12.5 MG tablet, TAKE ONE TABLET BY MOUTH TWICE A DAY, Disp: 180 tablet, Rfl: 3   colchicine 0.6 MG tablet, Take 1 tablet (0.6 mg total) by mouth daily., Disp: 90 tablet, Rfl: 1   escitalopram (LEXAPRO) 10 MG tablet, Take 1 tablet (10 mg total) by mouth daily., Disp: 90 tablet, Rfl: 1   famotidine (PEPCID) 20 MG tablet, Take 1 tablet (20 mg total) by mouth daily., Disp: 30 tablet, Rfl: 1   furosemide (LASIX) 40 MG tablet, TAKE ONE TABLET BY MOUTH EVERY MORNING AND ONE-HALF TABLET IN THE AFTERNOON, Disp: 135 tablet, Rfl: 1   rosuvastatin (CRESTOR) 20 MG tablet, Take 1 tablet (20 mg total) by mouth daily., Disp: 90 tablet, Rfl:  1   spironolactone (ALDACTONE) 25 MG tablet, TAKE ONE TABLET BY MOUTH DAILY, Disp: 90 tablet, Rfl: 1   SYNTHROID 88 MCG tablet, TAKE ONE TABLET BY MOUTH DAILY, Disp: 90 tablet, Rfl: 1   valsartan (DIOVAN) 160 MG tablet, TAKE ONE TABLET BY MOUTH DAILY, Disp: 90 tablet, Rfl: 1   Allergies  Allergen Reactions   Sulfa Antibiotics Swelling     Review of Systems   There were no vitals filed for this visit. There is no height or weight on file to calculate BMI.   Objective:  Physical Exam      Assessment And Plan:     There are no diagnoses linked to this encounter.    Patient was given opportunity to ask questions. Patient verbalized understanding of the plan and was able to repeat key elements of the plan. All questions were answered to their satisfaction.  Tonie Griffith, [place], Tonie Griffith, CMA, have reviewed all documentation for this visit. The documentation on 11/25/21 for the exam, diagnosis, procedures, and orders are all accurate and complete.   IF YOU HAVE BEEN REFERRED TO A SPECIALIST, IT MAY TAKE 1-2 WEEKS TO SCHEDULE/PROCESS THE REFERRAL. IF YOU HAVE NOT  HEARD FROM US/SPECIALIST IN TWO WEEKS, PLEASE GIVE [place] A CALL AT [phone].   THE PATIENT IS ENCOURAGED TO PRACTICE SOCIAL DISTANCING DUE TO THE COVID-19 PANDEMIC.

## 2021-12-01 ENCOUNTER — Encounter: Payer: Self-pay | Admitting: Cardiology

## 2021-12-01 ENCOUNTER — Ambulatory Visit: Payer: Federal, State, Local not specified - PPO | Attending: Cardiology | Admitting: Cardiology

## 2021-12-01 VITALS — BP 132/80 | HR 71 | Ht 64.6 in | Wt 183.0 lb

## 2021-12-01 DIAGNOSIS — R0609 Other forms of dyspnea: Secondary | ICD-10-CM

## 2021-12-01 DIAGNOSIS — I340 Nonrheumatic mitral (valve) insufficiency: Secondary | ICD-10-CM

## 2021-12-01 DIAGNOSIS — I300 Acute nonspecific idiopathic pericarditis: Secondary | ICD-10-CM

## 2021-12-01 DIAGNOSIS — I10 Essential (primary) hypertension: Secondary | ICD-10-CM | POA: Diagnosis not present

## 2021-12-01 DIAGNOSIS — E782 Mixed hyperlipidemia: Secondary | ICD-10-CM

## 2021-12-01 NOTE — Patient Instructions (Signed)
Medication Instructions:   Your physician recommends that you continue on your current medications as directed. Please refer to the Current Medication list given to you today.  *If you need a refill on your cardiac medications before your next appointment, please call your pharmacy*    Follow-Up: At [facility], you and your health needs are our priority.  As part of our continuing mission to provide you with exceptional heart care, we have created designated Provider Care Teams.  These Care Teams include your primary Cardiologist (physician) and Advanced Practice Providers (APPs -  Physician Assistants and Nurse Practitioners) who all work together to provide you with the care you need, when you need it.  We recommend signing up for the patient portal called "MyChart".  Sign up information is provided on this After Visit Summary.  MyChart is used to connect with patients for Virtual Visits (Telemedicine).  Patients are able to view lab/test results, encounter notes, upcoming appointments, etc.  Non-urgent messages can be sent to your provider as well.   To learn more about what you can do with MyChart, go to [email or web address].    Your next appointment:   1 year(s)  The format for your next appointment:   In Person  Provider:   Heather E Pemberton, MD       Important Information About Sugar       

## 2021-12-12 ENCOUNTER — Other Ambulatory Visit: Payer: Self-pay | Admitting: Internal Medicine

## 2022-01-06 ENCOUNTER — Other Ambulatory Visit: Payer: Self-pay

## 2022-01-06 MED ORDER — FUROSEMIDE 40 MG PO TABS: 40.0000 mg | ORAL_TABLET | Freq: Every day | ORAL | 2 refills | Status: DC

## 2022-01-26 ENCOUNTER — Telehealth: Payer: Self-pay

## 2022-01-26 NOTE — Patient Outreach (Signed)
  Care Coordination   Initial Visit Note   01/26/2022 Name: SHYKERIA SAKAMOTO MRN: [id number] DOB: 1955/04/24  ALIVYA WEGMAN is a 66 y.o. year old female who sees Glendale Chard, MD for primary care. I spoke with  Melissa Montane by phone today.  What matters to the patients health and wellness today?  Patient reports that she is doing well. She is able to manage her health care needs. She denies any THN/ RN CM needs or concerns.     Goals Addressed             This Visit's Progress    COMPLETED: Care Coordination-no follow up required       Care Coordination Interventions: Advised patient to complete care gaps-she voices she is up to date and recently had COVID booster, flu and shingles vaccine. Annual physical completed on 11/05/21 Provided education to patient re: [facility] services Assessed social determinant of health barriers          SDOH assessments and interventions completed:  Yes  SDOH Interventions Today    Flowsheet Row Most Recent Value  SDOH Interventions   Food Insecurity Interventions Patient Refused  [pt reported she would rather somoene worse off than her get the resources]  Transportation Interventions Intervention Not Indicated        Care Coordination Interventions Activated:  Yes  Care Coordination Interventions:  Yes, provided   Follow up plan: No further intervention required.   Encounter Outcome:  Pt. Visit Completed   Enzo Montgomery, RN,BSN,CCM [place] [employer] Telephonic Care Management Coordinator Direct Phone: [phone] Toll Free: [phone] Fax: [phone]

## 2022-01-29 ENCOUNTER — Other Ambulatory Visit: Payer: Self-pay | Admitting: Internal Medicine

## 2022-03-12 ENCOUNTER — Ambulatory Visit (INDEPENDENT_AMBULATORY_CARE_PROVIDER_SITE_OTHER): Payer: Federal, State, Local not specified - PPO | Admitting: Internal Medicine

## 2022-03-12 ENCOUNTER — Other Ambulatory Visit: Payer: Self-pay | Admitting: Internal Medicine

## 2022-03-12 ENCOUNTER — Encounter: Payer: Self-pay | Admitting: Internal Medicine

## 2022-03-12 VITALS — BP 124/80 | HR 67 | Temp 98.4°F | Ht 64.6 in | Wt 184.0 lb

## 2022-03-12 DIAGNOSIS — R7309 Other abnormal glucose: Secondary | ICD-10-CM

## 2022-03-12 DIAGNOSIS — Z0000 Encounter for general adult medical examination without abnormal findings: Secondary | ICD-10-CM | POA: Diagnosis not present

## 2022-03-12 DIAGNOSIS — N393 Stress incontinence (female) (male): Secondary | ICD-10-CM | POA: Diagnosis not present

## 2022-03-12 DIAGNOSIS — E890 Postprocedural hypothyroidism: Secondary | ICD-10-CM | POA: Diagnosis not present

## 2022-03-12 DIAGNOSIS — Z6831 Body mass index (BMI) 31.0-31.9, adult: Secondary | ICD-10-CM

## 2022-03-12 DIAGNOSIS — E7800 Pure hypercholesterolemia, unspecified: Secondary | ICD-10-CM | POA: Diagnosis not present

## 2022-03-12 DIAGNOSIS — I10 Essential (primary) hypertension: Secondary | ICD-10-CM | POA: Diagnosis not present

## 2022-03-12 DIAGNOSIS — E6609 Other obesity due to excess calories: Secondary | ICD-10-CM

## 2022-03-12 LAB — POCT URINALYSIS DIPSTICK
Bilirubin, UA: NEGATIVE
Blood, UA: NEGATIVE
Glucose, UA: NEGATIVE
Ketones, UA: NEGATIVE
Leukocytes, UA: NEGATIVE
Nitrite, UA: NEGATIVE
Protein, UA: NEGATIVE
Spec Grav, UA: 1.01 (ref 1.010–1.025)
Urobilinogen, UA: 0.2 E.U./dL
pH, UA: 5.5 (ref 5.0–8.0)

## 2022-03-12 NOTE — Progress Notes (Signed)
Rich Brave [place]leton,acting as a Education administrator for Maximino Greenland, MD.,have documented all relevant documentation on the behalf of Maximino Greenland, MD,as directed by  Maximino Greenland, MD while in the presence of Maximino Greenland, MD.   Subjective:     Patient ID: Stephanie Dudley , female    DOB: [birth year] , 66 y.o.   MRN: [id number]   Chief Complaint  Patient presents with   Annual Exam   Hypertension    HPI  She is here today for a full physical examination.  She is followed by [facility] for her pelvic exams.  She reports compliance with meds. She denies having headaches, chest pain and shortness of breath.    Hypertension This is a chronic problem. The current episode started more than 1 year ago. The problem has been gradually improving since onset. The problem is uncontrolled. Pertinent negatives include no blurred vision. Risk factors for coronary artery disease include obesity, sedentary lifestyle and post-menopausal state. Past treatments include beta blockers. The current treatment provides moderate improvement. Compliance problems include exercise.      Past Medical History:  Diagnosis Date   Arthritis    Breast mass, right 08/2017   Benign tissue   Complication of anesthesia    hard to wake up with thyroid surgery   Family history of breast cancer    Family history of thyroid cancer    Family history of uterine cancer    Hyperlipidemia    Hypertension    Hypothyroidism    Mitral valve prolapse    a. mild by echo 04/2018.// Echocardiogram 12/21: EF 60-65, no RWMA, mild LVH, GR 1 DD, normal RVSF, mild LAE, myxomatous MV, mild MR, mild MVP, no pericardial effusion    Obesity    Pericardial effusion    Pericarditis    Peripheral edema    ankles, feet     Family History  Problem Relation Age of Onset   Breast cancer Sister 51   Thyroid cancer Mother    Hypertension Father    Hyperlipidemia Father    Heart Problems Father    Uterine cancer Paternal Grandmother  72     Current Outpatient Medications:    carvedilol (COREG) 12.5 MG tablet, TAKE ONE TABLET BY MOUTH TWICE A DAY, Disp: 180 tablet, Rfl: 3   colchicine 0.6 MG tablet, Take 1 tablet (0.6 mg total) by mouth daily., Disp: 90 tablet, Rfl: 1   escitalopram (LEXAPRO) 10 MG tablet, TAKE 1 TABLET BY MOUTH DAILY, Disp: 90 tablet, Rfl: 1   famotidine (PEPCID) 20 MG tablet, TAKE 1 TABLET BY MOUTH DAILY, Disp: 15 tablet, Rfl: 0   furosemide (LASIX) 40 MG tablet, Take 1 tablet (40 mg total) by mouth daily., Disp: 135 tablet, Rfl: 2   rosuvastatin (CRESTOR) 20 MG tablet, TAKE 1 TABLET BY MOUTH DAILY, Disp: 90 tablet, Rfl: 1   spironolactone (ALDACTONE) 25 MG tablet, TAKE 1 TABLET BY MOUTH DAILY, Disp: 90 tablet, Rfl: 1   SYNTHROID 88 MCG tablet, TAKE 1 TABLET BY MOUTH DAILY, Disp: 90 tablet, Rfl: 1   valsartan (DIOVAN) 160 MG tablet, TAKE 1 TABLET BY MOUTH DAILY, Disp: 90 tablet, Rfl: 1   Allergies  Allergen Reactions   Sulfa Antibiotics Swelling      The patient states she uses post menopausal status for birth control. Last LMP was No LMP recorded. Patient is postmenopausal.. Negative for Dysmenorrhea. Negative for: breast discharge, breast lump(s), breast pain and breast self exam. Associated symptoms include abnormal  vaginal bleeding. Pertinent negatives include abnormal bleeding (hematology), anxiety, decreased libido, depression, difficulty falling sleep, dyspareunia, history of infertility, nocturia, sexual dysfunction, sleep disturbances, urinary incontinence, urinary urgency, vaginal discharge and vaginal itching. Diet regular.   . The patient's tobacco use is:  Social History   Tobacco Use  Smoking Status Former   Packs/day: 1.00   Years: 15.00   Total pack years: 15.00   Types: Cigarettes  Smokeless Tobacco Never  . She has been exposed to passive smoke. The patient's alcohol use is:  Social History   Substance and Sexual Activity  Alcohol Use Yes   Comment: wine 2x/wk   Review of  Systems  Constitutional: Negative.   HENT: Negative.    Eyes: Negative.  Negative for blurred vision.  Respiratory: Negative.    Cardiovascular: Negative.   Gastrointestinal: Negative.   Endocrine: Negative.   Genitourinary:  Positive for urgency. Pelvic pain: el3.      She c/o incontinence with coughing/sneezing.   Musculoskeletal: Negative.   Skin: Negative.   Allergic/Immunologic: Negative.   Neurological: Negative.   Hematological: Negative.   Psychiatric/Behavioral: Negative.       Today's Vitals   03/12/22 0959  BP: 124/80  Pulse: 67  Temp: 98.4 F (36.9 C)  Weight: 184 lb (83.5 kg)  Height: 5' 4.6" (1.641 m)  PainSc: 0-No pain   Body mass index is 31 kg/m.  Wt Readings from Last 3 Encounters:  03/12/22 184 lb (83.5 kg)  12/01/21 183 lb (83 kg)  08/20/21 182 lb 6.4 oz (82.7 kg)     Objective:  Physical Exam Vitals and nursing note reviewed.  Constitutional:      Appearance: Normal appearance.  HENT:     Head: Normocephalic and atraumatic.     Right Ear: Tympanic membrane, ear canal and external ear normal.     Left Ear: Tympanic membrane, ear canal and external ear normal.     Nose:     Comments: Masked     Mouth/Throat:     Comments: Masked  Eyes:     Extraocular Movements: Extraocular movements intact.     Conjunctiva/sclera: Conjunctivae normal.     Pupils: Pupils are equal, round, and reactive to light.  Cardiovascular:     Rate and Rhythm: Normal rate and regular rhythm.     Pulses: Normal pulses.     Heart sounds: Normal heart sounds.  Pulmonary:     Effort: Pulmonary effort is normal.     Breath sounds: Normal breath sounds.  Chest:  Breasts:    Tanner Score is 5.     Right: Normal.     Left: Normal.  Abdominal:     General: Abdomen is flat. Bowel sounds are normal.     Palpations: Abdomen is soft.  Genitourinary:    Comments: deferred Musculoskeletal:        General: Normal range of motion.     Cervical back: Normal range of motion  and neck supple.  Skin:    General: Skin is warm and dry.  Neurological:     General: No focal deficit present.     Mental Status: She is alert and oriented to person, place, and time.  Psychiatric:        Mood and Affect: Mood normal.        Behavior: Behavior normal.       Assessment And Plan:     1. Encounter for annual physical exam Comments: A full exam was performed. She is encouraged to  perform monthly self breast exams. She will continue under the care of [facility] for her pelvic exams.  PATIENT IS ADVISED TO GET 30-45 MINUTES REGULAR EXERCISE NO LESS THAN FOUR TO FIVE DAYS PER WEEK - BOTH WEIGHTBEARING EXERCISES AND AEROBIC ARE RECOMMENDED.  PATIENT IS ADVISED TO FOLLOW A HEALTHY DIET WITH AT LEAST SIX FRUITS/VEGGIES PER DAY, DECREASE INTAKE OF RED MEAT, AND TO INCREASE FISH INTAKE TO TWO DAYS PER WEEK.  MEATS/FISH SHOULD NOT BE FRIED, BAKED OR BROILED IS PREFERABLE.  IT IS ALSO IMPORTANT TO CUT BACK ON YOUR SUGAR INTAKE. PLEASE AVOID ANYTHING WITH ADDED SUGAR, CORN SYRUP OR OTHER SWEETENERS. IF YOU MUST USE A SWEETENER, YOU CAN TRY STEVIA. IT IS ALSO IMPORTANT TO AVOID ARTIFICIALLY SWEETENERS AND DIET BEVERAGES. LASTLY, I SUGGEST WEARING SPF 50 SUNSCREEN ON EXPOSED PARTS AND ESPECIALLY WHEN IN THE DIRECT SUNLIGHT FOR AN EXTENDED PERIOD OF TIME.  PLEASE AVOID FAST FOOD RESTAURANTS AND INCREASE YOUR WATER INTAKE.  2. Essential hypertension Comments: Chronic, controlled.  EKG performed, NSR w/o acute changes. NO med changes are needed, she is encouraged to continue to follow low sodium diet. F/u 6 months. - POCT Urinalysis Dipstick (81002) - Microalbumin / creatinine urine ratio - EKG 12-Lead - CMP14+EGFR - CBC - Hemoglobin A1c - Lipid panel  3. Stress incontinence Comments: I will check u/a today to r/o UTI. She agrees to referral to PT for pelvic floor exercise training. - Ambulatory referral to Physical Therapy  4. Pure hypercholesterolemia Comments: Chronic, she is  currently on rosuvastatin 90m daily.  She is encouraged to follow a heart healthy lifestyle. - CMP14+EGFR - CBC - Lipid panel  5. Postoperative hypothyroidism Comments: Chronic, I will check thyroid panel and adjust meds as needed. She agrees to rto in six months for re-evaluation, sooner if meds are adjusted. - TSH + free T4  6. Other abnormal glucose Comments: Her a1c has been elevated in the past. I will recheck this today. She is encouraged to limit her intake of sugary beverages, including diet drinks. - CMP14+EGFR - Hemoglobin A1c  7. Class 1 obesity due to excess calories with serious comorbidity and body mass index (BMI) of 31.0 to 31.9 in adult Comments: She is encouraged to aim for at least 150 minutes of exercise/week, while striving for BMI<30 to decrease cardiac risk.  Patient was given opportunity to ask questions. Patient verbalized understanding of the plan and was able to repeat key elements of the plan. All questions were answered to their satisfaction.   I, RMaximino Greenland MD, have reviewed all documentation for this visit. The documentation on 03/12/22 for the exam, diagnosis, procedures, and orders are all accurate and complete.   THE PATIENT IS ENCOURAGED TO PRACTICE SOCIAL DISTANCING DUE TO THE COVID-19 PANDEMIC.

## 2022-03-12 NOTE — Patient Instructions (Signed)

## 2022-03-13 LAB — CMP14+EGFR
ALT: 16 IU/L (ref 0–32)
AST: 25 IU/L (ref 0–40)
Albumin/Globulin Ratio: 2 (ref 1.2–2.2)
Albumin: 4.8 g/dL (ref 3.9–4.9)
Alkaline Phosphatase: 77 IU/L (ref 44–121)
BUN/Creatinine Ratio: 12 (ref 12–28)
BUN: 10 mg/dL (ref 8–27)
Bilirubin Total: 0.7 mg/dL (ref 0.0–1.2)
CO2: 25 mmol/L (ref 20–29)
Calcium: 9.8 mg/dL (ref 8.7–10.3)
Chloride: 100 mmol/L (ref 96–106)
Creatinine, Ser: 0.83 mg/dL (ref 0.57–1.00)
Globulin, Total: 2.4 g/dL (ref 1.5–4.5)
Glucose: 97 mg/dL (ref 70–99)
Potassium: 4.1 mmol/L (ref 3.5–5.2)
Sodium: 139 mmol/L (ref 134–144)
Total Protein: 7.2 g/dL (ref 6.0–8.5)
eGFR: 79 mL/min/{1.73_m2} (ref >59)

## 2022-03-13 LAB — LIPID PANEL
Chol/HDL Ratio: 3.1 ratio (ref 0.0–4.4)
Cholesterol, Total: 183 mg/dL (ref 100–199)
HDL: 59 mg/dL (ref >39)
LDL Chol Calc (NIH): 99 mg/dL (ref 0–99)
Triglycerides: 146 mg/dL (ref 0–149)
VLDL Cholesterol Cal: 25 mg/dL (ref 5–40)

## 2022-03-13 LAB — CBC
Hematocrit: 37.2 % (ref 34.0–46.6)
Hemoglobin: 12.4 g/dL (ref 11.1–15.9)
MCH: 30.5 pg (ref 26.6–33.0)
MCHC: 33.3 g/dL (ref 31.5–35.7)
MCV: 91 fL (ref 79–97)
Platelets: 227 10*3/uL (ref 150–450)
RBC: 4.07 x10E6/uL (ref 3.77–5.28)
RDW: 11.7 % (ref 11.7–15.4)
WBC: 5 10*3/uL (ref 3.4–10.8)

## 2022-03-13 LAB — MICROALBUMIN / CREATININE URINE RATIO
Creatinine, Urine: 14.6 mg/dL
Microalb/Creat Ratio: <21 mg/g creat (ref 0–29)
Microalbumin, Urine: <3 ug/mL

## 2022-03-13 LAB — HEMOGLOBIN A1C
Est. average glucose Bld gHb Est-mCnc: 111 mg/dL
Hgb A1c MFr Bld: 5.5 % (ref 4.8–5.6)

## 2022-03-13 LAB — TSH+FREE T4
Free T4: 1.57 ng/dL (ref 0.82–1.77)
TSH: 1.33 u[IU]/mL (ref 0.450–4.500)

## 2022-04-06 ENCOUNTER — Telehealth: Payer: Self-pay

## 2022-04-06 NOTE — Telephone Encounter (Signed)
I called and left pt vm. We wanted to see if she was still interested in doing physical therapy at [facility]. They have called her multiple times and she has not called back to schedule an appt. [place]

## 2022-04-21 ENCOUNTER — Other Ambulatory Visit: Payer: Self-pay | Admitting: Internal Medicine

## 2022-04-27 ENCOUNTER — Other Ambulatory Visit (HOSPITAL_BASED_OUTPATIENT_CLINIC_OR_DEPARTMENT_OTHER): Payer: Self-pay | Admitting: Family

## 2022-04-27 DIAGNOSIS — M6281 Muscle weakness (generalized): Secondary | ICD-10-CM | POA: Diagnosis not present

## 2022-04-27 DIAGNOSIS — R278 Other lack of coordination: Secondary | ICD-10-CM | POA: Diagnosis not present

## 2022-04-27 DIAGNOSIS — N3946 Mixed incontinence: Secondary | ICD-10-CM | POA: Diagnosis not present

## 2022-04-27 DIAGNOSIS — I300 Acute nonspecific idiopathic pericarditis: Secondary | ICD-10-CM

## 2022-04-27 NOTE — Telephone Encounter (Signed)
Rx(s) sent to pharmacy electronically.  

## 2022-05-27 ENCOUNTER — Other Ambulatory Visit: Payer: Self-pay

## 2022-05-27 MED ORDER — CARVEDILOL 12.5 MG PO TABS: 12.5000 mg | ORAL_TABLET | Freq: Two times a day (BID) | ORAL | 1 refills | Status: DC

## 2022-06-01 ENCOUNTER — Emergency Department (HOSPITAL_BASED_OUTPATIENT_CLINIC_OR_DEPARTMENT_OTHER)
Admission: EM | Admit: 2022-06-01 | Discharge: 2022-06-01 | Disposition: A | Discharge status: Home / Self Care | Payer: Medicare Other | Attending: Emergency Medicine | Admitting: Emergency Medicine

## 2022-06-01 ENCOUNTER — Encounter (HOSPITAL_BASED_OUTPATIENT_CLINIC_OR_DEPARTMENT_OTHER): Payer: Self-pay

## 2022-06-01 ENCOUNTER — Emergency Department (HOSPITAL_BASED_OUTPATIENT_CLINIC_OR_DEPARTMENT_OTHER): Payer: Medicare Other

## 2022-06-01 DIAGNOSIS — R55 Syncope and collapse: Secondary | ICD-10-CM | POA: Diagnosis not present

## 2022-06-01 DIAGNOSIS — Z1152 Encounter for screening for COVID-19: Secondary | ICD-10-CM | POA: Diagnosis not present

## 2022-06-01 DIAGNOSIS — E860 Dehydration: Secondary | ICD-10-CM

## 2022-06-01 DIAGNOSIS — R4182 Altered mental status, unspecified: Secondary | ICD-10-CM | POA: Diagnosis not present

## 2022-06-01 DIAGNOSIS — R0602 Shortness of breath: Secondary | ICD-10-CM | POA: Diagnosis not present

## 2022-06-01 LAB — COMPREHENSIVE METABOLIC PANEL
ALT: 23 U/L (ref 0–44)
AST: 30 U/L (ref 15–41)
Albumin: 4.4 g/dL (ref 3.5–5.0)
Alkaline Phosphatase: 63 U/L (ref 38–126)
Anion gap: 12 (ref 5–15)
BUN: 10 mg/dL (ref 8–23)
CO2: 20 mmol/L — ABNORMAL LOW (ref 22–32)
Calcium: 8.6 mg/dL — ABNORMAL LOW (ref 8.9–10.3)
Chloride: 101 mmol/L (ref 98–111)
Creatinine, Ser: 0.71 mg/dL (ref 0.44–1.00)
GFR, Estimated: >60 mL/min (ref >60)
Glucose, Bld: 89 mg/dL (ref 70–99)
Potassium: 3.7 mmol/L (ref 3.5–5.1)
Sodium: 133 mmol/L — ABNORMAL LOW (ref 135–145)
Total Bilirubin: 0.8 mg/dL (ref 0.3–1.2)
Total Protein: 7.7 g/dL (ref 6.5–8.1)

## 2022-06-01 LAB — TSH: TSH: 1.672 u[IU]/mL (ref 0.350–4.500)

## 2022-06-01 LAB — CBC
HCT: 37.9 % (ref 36.0–46.0)
Hemoglobin: 12.5 g/dL (ref 12.0–15.0)
MCH: 31.3 pg (ref 26.0–34.0)
MCHC: 33 g/dL (ref 30.0–36.0)
MCV: 94.8 fL (ref 80.0–100.0)
Platelets: 272 10*3/uL (ref 150–400)
RBC: 4 MIL/uL (ref 3.87–5.11)
RDW: 12.8 % (ref 11.5–15.5)
WBC: 6.8 10*3/uL (ref 4.0–10.5)
nRBC: 0 % (ref 0.0–0.2)

## 2022-06-01 LAB — RESP PANEL BY RT-PCR (RSV, FLU A&B, COVID)  RVPGX2
Influenza A by PCR: NEGATIVE
Influenza B by PCR: NEGATIVE
Resp Syncytial Virus by PCR: NEGATIVE
SARS Coronavirus 2 by RT PCR: NEGATIVE

## 2022-06-01 LAB — TROPONIN I (HIGH SENSITIVITY)
Troponin I (High Sensitivity): 2 ng/L (ref <18)
Troponin I (High Sensitivity): 2 ng/L (ref <18)

## 2022-06-01 LAB — CBG MONITORING, ED: Glucose-Capillary: 94 mg/dL (ref 70–99)

## 2022-06-01 MED ORDER — SODIUM CHLORIDE 0.9 % IV BOLUS
1000.0000 mL | Freq: Once | INTRAVENOUS | Status: AC
  Administered 2022-06-01: 1000 mL via INTRAVENOUS

## 2022-06-01 NOTE — Discharge Instructions (Signed)
Your history, exam, workup today was overall reassuring but you did appear slightly dehydrated.  I suspect you not eating or drinking since yesterday and not sleeping well led to the passing out and near passing out episodes.  Given your otherwise stability for over 4 hours and well appearance and feeling well after fluids I feel you are safe for discharge home.  Please be careful standing up quickly and make sure you are eating and drinking tonight.  Please follow-up with your primary doctor and they will follow-up on the TSH result.  If any symptoms change or worsen acutely, please return to the nearest emergency department.

## 2022-06-01 NOTE — ED Provider Notes (Signed)
[place] EMERGENCY DEPARTMENT AT [facility] Provider Note   CSN: [id number] Arrival date & time: 06/01/22  1055     History  Chief Complaint  Patient presents with   Altered Mental Status   Shortness of Breath    Stephanie Dudley is a 67 y.o. female.  The history is provided by the patient and medical records. No language interpreter was used.  Altered Mental Status Presenting symptoms: partial responsiveness   Presenting symptoms: no confusion   Severity:  Moderate Most recent episode:  Today Episode history:  Multiple Timing:  Unable to specify Progression:  Unchanged Associated symptoms: light-headedness   Associated symptoms: no abdominal pain, no agitation, no fever, no headaches, no nausea, no palpitations, no rash, no vomiting and no weakness   Shortness of Breath Associated symptoms: no abdominal pain, no chest pain, no cough, no fever, no headaches, no neck pain, no rash, no vomiting and no wheezing        Home Medications Prior to Admission medications   Medication Sig Start Date End Date Taking? Authorizing Provider  carvedilol (COREG) 12.5 MG tablet Take 1 tablet (12.5 mg total) by mouth 2 (two) times daily. 05/27/22   Freada Bergeron, MD  colchicine 0.6 MG tablet TAKE 1 TABLET BY MOUTH DAILY 04/27/22   Loel Dubonnet, NP  escitalopram (LEXAPRO) 10 MG tablet TAKE 1 TABLET BY MOUTH DAILY 01/29/22   Glendale Chard, MD  famotidine (PEPCID) 20 MG tablet TAKE 1 TABLET BY MOUTH DAILY 04/21/22   Glendale Chard, MD  furosemide (LASIX) 40 MG tablet Take 1 tablet (40 mg total) by mouth daily. 01/06/22   Glendale Chard, MD  rosuvastatin (CRESTOR) 20 MG tablet TAKE 1 TABLET BY MOUTH DAILY 03/12/22   Glendale Chard, MD  spironolactone (ALDACTONE) 25 MG tablet TAKE 1 TABLET BY MOUTH DAILY 03/12/22   Glendale Chard, MD  SYNTHROID 88 MCG tablet TAKE 1 TABLET BY MOUTH DAILY 03/12/22   Glendale Chard, MD  valsartan (DIOVAN) 160 MG tablet TAKE 1 TABLET BY  MOUTH DAILY 03/12/22   Glendale Chard, MD      Allergies    Sulfa antibiotics    Review of Systems   Review of Systems  Constitutional:  Positive for fatigue. Negative for chills and fever.  HENT:  Negative for congestion.   Eyes:  Negative for visual disturbance.  Respiratory:  Positive for shortness of breath. Negative for cough, chest tightness and wheezing.   Cardiovascular:  Negative for chest pain and palpitations.  Gastrointestinal:  Negative for abdominal pain, constipation, diarrhea, nausea and vomiting.  Genitourinary:  Negative for dysuria.  Musculoskeletal:  Negative for back pain, neck pain and neck stiffness.  Skin:  Negative for rash and wound.  Neurological:  Positive for syncope and light-headedness. Negative for dizziness, facial asymmetry, weakness and headaches.  Psychiatric/Behavioral:  Negative for agitation and confusion.   All other systems reviewed and are negative.   Physical Exam Updated Vital Signs BP (!) 188/91 (BP Location: Left Arm)   Pulse 75   Temp 98.3 F (36.8 C) (Oral)   Resp 16   Ht 5' 4.5" (1.638 m)   Wt 81.6 kg   SpO2 95%   BMI 30.42 kg/m  Physical Exam Vitals and nursing note reviewed.  Constitutional:      General: She is not in acute distress.    Appearance: She is well-developed. She is not ill-appearing, toxic-appearing or diaphoretic.  HENT:     Head: Normocephalic and atraumatic.  Mouth/Throat:     Mouth: Mucous membranes are moist.  Eyes:     Conjunctiva/sclera: Conjunctivae normal.  Cardiovascular:     Rate and Rhythm: Normal rate and regular rhythm.     Heart sounds: No murmur heard. Pulmonary:     Effort: Pulmonary effort is normal. No respiratory distress.     Breath sounds: Normal breath sounds. No decreased breath sounds, wheezing, rhonchi or rales.  Chest:     Chest wall: No tenderness.  Abdominal:     Palpations: Abdomen is soft.     Tenderness: There is no abdominal tenderness.  Musculoskeletal:         General: No swelling.     Cervical back: Neck supple.     Right lower leg: No tenderness. No edema.     Left lower leg: No tenderness. No edema.  Skin:    General: Skin is warm and dry.     Capillary Refill: Capillary refill takes less than 2 seconds.     Findings: No erythema.  Neurological:     General: No focal deficit present.     Mental Status: She is alert.     Cranial Nerves: No cranial nerve deficit.  Psychiatric:        Mood and Affect: Mood normal.     ED Results / Procedures / Treatments   Labs (all labs ordered are listed, but only abnormal results are displayed) Labs Reviewed  COMPREHENSIVE METABOLIC PANEL - Abnormal; Notable for the following components:      Result Value   Sodium 133 (*)    CO2 20 (*)    Calcium 8.6 (*)    All other components within normal limits  RESP PANEL BY RT-PCR (RSV, FLU A&B, COVID)  RVPGX2  CBC  TSH  CBG MONITORING, ED  CBG MONITORING, ED  TROPONIN I (HIGH SENSITIVITY)  TROPONIN I (HIGH SENSITIVITY)    EKG EKG Interpretation  Date/Time:  Monday June 01 2022 11:06:05 EDT Ventricular Rate:  69 PR Interval:  169 QRS Duration: 86 QT Interval:  398 QTC Calculation: 427 R Axis:   50 Text Interpretation: Sinus rhythm when compared to prior, similar appearance. NO STEMI Confirmed by Antony Blackbird [phone]) on 06/01/2022 11:24:06 AM  Radiology CT Head Wo Contrast  Result Date: 06/01/2022 CLINICAL DATA:  67 year old female with altered mental status. EXAM: CT HEAD WITHOUT CONTRAST TECHNIQUE: Contiguous axial images were obtained from the base of the skull through the vertex without intravenous contrast. RADIATION DOSE REDUCTION: This exam was performed according to the departmental dose-optimization program which includes automated exposure control, adjustment of the mA and/or kV according to patient size and/or use of iterative reconstruction technique. COMPARISON:  02/22/2021 MR and CT FINDINGS: Brain: No evidence of acute infarction,  hemorrhage, hydrocephalus, extra-axial collection or mass lesion/mass effect. Chronic small-vessel white matter ischemic changes again noted. Vascular: No hyperdense vessel or unexpected calcification. Skull: Normal. Negative for fracture or focal lesion. Sinuses/Orbits: No acute finding. Other: None. IMPRESSION: 1. No evidence of acute intracranial abnormality. 2. Chronic small-vessel white matter ischemic changes. Electronically Signed   By: Margarette Canada M.D.   On: 06/01/2022 14:02   DG Chest 2 View  Result Date: 06/01/2022 CLINICAL DATA:  Shortness of breath.  Episode of unresponsiveness. EXAM: CHEST - 2 VIEW COMPARISON:  05/03/2018 FINDINGS: Heart size is normal. Tortuosity of the aorta as seen previously. The lungs are clear. The vascularity is normal. No effusions. No significant bone finding. IMPRESSION: No active cardiopulmonary disease. Electronically Signed  By: Nelson Chimes M.D.   On: 06/01/2022 11:22    Procedures Procedures    Medications Ordered in ED Medications  sodium chloride 0.9 % bolus 1,000 mL (0 mLs Intravenous Stopped 06/01/22 1443)    ED Course/ Medical Decision Making/ A&P                             Medical Decision Making Amount and/or Complexity of Data Reviewed Labs: ordered. Radiology: ordered.    JONATHON DOOR is a 67 y.o. female with a past medical history significant for hypertension, hyperlipidemia, hypothyroidism, previous pericarditis, single episode of possible seizure years ago, and obesity who presents with syncope and near syncope.  According to patient, today she was driving her car and felt near syncopal.  She was able to pull over in a parking lot while talking to her husband and then passed out.  She reports she was unresponsive about 15 minutes before husband was able to come wake her up.  She had this happen about 20 years ago briefly.  She says that while her family was getting her and brought her back to the car she said she was near  syncopal several times but never lost consciousness again.  She denies any chest pain, palpitations.  She reports a mild headache but denies nausea or vomiting.  Denies constipation or diarrhea.  She reports she is not able to eat or drink since yesterday at 2 PM and did not get much sleep last night as she is been taking care of family numbers.  She does report she was exposed to influenza last week with a grandchild and has had some congestion.  Otherwise denies abdominal pain, back pain, headache, neck pain.  Denies any focal neurologic complaints with no vision changes, speech abnormalities, numbness, tingling, or weakness.  Denies any dizziness.  She just had the lightheadedness at times and then a syncopal episode.  On exam, mucous membranes are dry.  Chest nontender.  I do not appreciate a murmur.  Abdomen nontender.  Good pulses in extremities.  No focal neurologic deficits with intact sensation, strength, and pulse in extremities.  Normal finger-nose-finger testing.  Symmetric smile.  Pupils symmetric and reactive with normal extract movements.  Lungs clear.  Chest nontender.  Patient otherwise well-appearing.  Had a shared decision-making conversation with patient.  I suspect that her not eating or drinking since yesterday along with not sleeping led to a syncopal near syncopal episodes from some dehydration.  Will give some fluids.  Will get screening labs.  Will get a CT head given the mild headache she has had as well as the syncopal and near syncope.  She denies any focal deficits at this time.  No evidence of acute trauma.  No recent medication use.  Does not sound like a seizure there was no shaking activity and she was arousable on stimulation by family.  With her congestion and viral exposure we will check her for influenza.  Anticipate reassessment after workup and rehydration to determine disposition.      Workup returned overall reassuring.  Patient did have slightly low sodium but  otherwise workup reassuring.  CT head shows no acute evidence of stroke at this time and urinalysis did not show UTI.  Patient was feeling well and felt much better after fluids.  Patient would like to go home.  I suspect not eating and drinking since yesterday, not sleeping well, and feeling fatigue  like to this lightheaded and near syncopal episode.  Dehydration likely played a role as well.  Patient will increase hydration and eat and drink well overnight and will follow-up with the primary doctor.  We discussed a TSH is still in process and she needs to follow-up with PCP for this.  Patient understands return precautions and follow-up instructions and was discharged in good condition after improved symptoms and stability for over 4 hours with any recurrent symptoms at all.        Final Clinical Impression(s) / ED Diagnoses Final diagnoses:  Syncope, unspecified syncope type  Near syncope  Dehydration    Rx / DC Orders ED Discharge Orders     None      Clinical Impression: 1. Syncope, unspecified syncope type   2. Near syncope   3. Dehydration     Disposition: Discharge  Condition: Good  I have discussed the results, Dx and Tx plan with the pt(& family if present). He/she/they expressed understanding and agree(s) with the plan. Discharge instructions discussed at great length. Strict return precautions discussed and pt &/or family have verbalized understanding of the instructions. No further questions at time of discharge.    New Prescriptions   No medications on file    Follow Up: Glendale Chard, [place] [address] [place] [facility] at [facility] [address] [id number] [place] [phone]       Brace Welte, Gwenyth Allegra, MD 06/01/22 1515

## 2022-06-01 NOTE — ED Notes (Signed)
Pt. Reports she has not urinated since yesterday  Pt. Also reporting she has not been drinking properly  due to she has been caring for her elderly sick father and her grandchildren.  Pt. Reports she has not taken her daily meds today due to she has been too busy.  Pt. Normally takes her daily meds but today she has had no time.

## 2022-06-01 NOTE — ED Notes (Signed)
Pt. Reports she was driving this day and felt she was losing herself.   She pulled over and called her husband and let him know how she was feeling.  Pt. Said she felt faint and weak.

## 2022-06-01 NOTE — ED Triage Notes (Addendum)
Patients daughter states around 1020 she started becoming "unresponsive". Upon arrival to ED, performed sternal rub in car to arouse patient, woke up immediately. Alert & oriented during triage. C/o some shortness of breath. States was "woozy" this morning. Been around grandchildren with flu/strep.  Didn't take antihypertensives this morning.

## 2022-06-01 NOTE — ED Notes (Signed)
Reviewed discharge instructions and recommendations with pt. Pt states understanding. Ambulatory at discharge with family

## 2022-06-02 ENCOUNTER — Other Ambulatory Visit: Payer: Self-pay | Admitting: Internal Medicine

## 2022-06-02 ENCOUNTER — Encounter: Payer: Self-pay | Admitting: Internal Medicine

## 2022-06-02 MED ORDER — ALPRAZOLAM 0.25 MG PO TABS: 0.2500 mg | ORAL_TABLET | Freq: Two times a day (BID) | ORAL | 0 refills | Status: DC | PRN

## 2022-06-29 ENCOUNTER — Encounter: Payer: Self-pay | Admitting: Nurse Practitioner

## 2022-06-29 ENCOUNTER — Ambulatory Visit (HOSPITAL_BASED_OUTPATIENT_CLINIC_OR_DEPARTMENT_OTHER): Payer: Medicare Other

## 2022-06-29 ENCOUNTER — Other Ambulatory Visit: Payer: Self-pay | Admitting: Nurse Practitioner

## 2022-06-29 ENCOUNTER — Ambulatory Visit: Payer: Medicare Other | Attending: Nurse Practitioner | Admitting: Nurse Practitioner

## 2022-06-29 VITALS — BP 132/78 | HR 74 | Ht 66.0 in | Wt 183.0 lb

## 2022-06-29 DIAGNOSIS — R072 Precordial pain: Secondary | ICD-10-CM | POA: Diagnosis not present

## 2022-06-29 DIAGNOSIS — I341 Nonrheumatic mitral (valve) prolapse: Secondary | ICD-10-CM

## 2022-06-29 DIAGNOSIS — I300 Acute nonspecific idiopathic pericarditis: Secondary | ICD-10-CM | POA: Diagnosis not present

## 2022-06-29 DIAGNOSIS — I340 Nonrheumatic mitral (valve) insufficiency: Secondary | ICD-10-CM | POA: Diagnosis not present

## 2022-06-29 LAB — ECHOCARDIOGRAM LIMITED
Area-P 1/2: 2.99 cm2
Height: 66 in
S' Lateral: 2.7 cm
Weight: 2928 oz

## 2022-06-29 MED ORDER — COLCHICINE 0.6 MG PO TABS: 0.6000 mg | ORAL_TABLET | Freq: Two times a day (BID) | ORAL | 3 refills | Status: DC

## 2022-06-29 NOTE — Progress Notes (Signed)
Cardiology Office Note:    Date:  06/29/2022   ID:  Stephanie Dudley, DOB 25-Jan-1956, MRN [id number]  PCP:  Dorothyann Peng, MD   [facility] Providers Cardiologist:  Meriam Sprague, MD Cardiology APP:  Kennon Rounds     Referring MD: Dorothyann Peng, MD   Chief Complaint: chest pain  History of Present Illness:    Stephanie Dudley is a very pleasant 67 y.o. female with a hx of recurrent pericarditis with associated pericardial effusion (01/2018, 03/2018, 02/2020, HTN, HLD, mild MVP, hypothyroidism, breast cancer s/p right lumpectomy.  Admitted 01/2018 with idiopathic pericarditis and had poor response to NSAIDs.  She was instead treated with steroid taper.  Had abnormal RF but rheumatology did not feel she warranted further evaluation.  Repeat pericarditis 03/2018 with longer steroid taper and colchicine for 6 months.  Recurrent pericarditis 02/2020.  Seen by Gillian Shields, NP on 12/2020 where she was started on prednisone for concern for possible RA.  At follow-up 06/11/2021 amlodipine was stopped due to hypotension.  She declined coronary calcium score.  Last cardiology clinic visit was 12/01/21 with Dr. Shari Prows.  She reported occasional LE edema for which she takes Lasix and occasional palpitations.  BP was well-controlled.  She has been continued on colchicine 0.6 mg daily.  Today, she was a late add-on to my schedule for due to chest pain that she feels may be return of pericarditis. Describes discomfort as "constant ache." Pain started two days ago while watching television. Pain has been intermittent since that time and is located in left chest. It is difficult to get comfortable when she is lying down.  Took some ibuprofen with no improvement. Continues to take colchicine once daily. Symptoms not bad enough to go to ED. Previous ED visit 06/01/22 was in the setting of her father dying, was exhausted, had not eaten and thought she was going to pass out. Reports she  frequently feels short of breath when "running around." Has palpitations at night when lying down.  No orthopnea, PND, edema, presyncope, syncope. No recent illness.   Past Medical History:  Diagnosis Date   Arthritis    Breast mass, right 08/2017   Benign tissue   Complication of anesthesia    hard to wake up with thyroid surgery   Family history of breast cancer    Family history of thyroid cancer    Family history of uterine cancer    Hyperlipidemia    Hypertension    Hypothyroidism    Mitral valve prolapse    a. mild by echo 04/2018.// Echocardiogram 12/21: EF 60-65, no RWMA, mild LVH, GR 1 DD, normal RVSF, mild LAE, myxomatous MV, mild MR, mild MVP, no pericardial effusion    Obesity    Pericardial effusion    Pericarditis    Peripheral edema    ankles, feet    Past Surgical History:  Procedure Laterality Date   APPENDECTOMY  1994   BREAST BIOPSY Left 1999   BREAST LUMPECTOMY WITH RADIOACTIVE SEED LOCALIZATION Right 09/28/2017   Procedure: RIGHT BREAST LUMPECTOMY WITH RADIOACTIVE SEED LOCALIZATION;  Surgeon: Harriette Bouillon, MD;  Location: [facility];  Service: General;  Laterality: Right;   BREAST SURGERY     KNEE ARTHROSCOPY Left    MIDDLE EAR SURGERY Left 2011   THYROIDECTOMY  1974   goiter--total thyroidectomy   TONSILLECTOMY  1972   WRIST FRACTURE SURGERY      Current Medications: Current Meds  Medication Sig  ALPRAZolam (XANAX) 0.25 MG tablet Take 1 tablet (0.25 mg total) by mouth 2 (two) times daily as needed for anxiety.   carvedilol (COREG) 12.5 MG tablet Take 1 tablet (12.5 mg total) by mouth 2 (two) times daily.   escitalopram (LEXAPRO) 10 MG tablet TAKE 1 TABLET BY MOUTH DAILY   famotidine (PEPCID) 20 MG tablet TAKE 1 TABLET BY MOUTH DAILY   furosemide (LASIX) 40 MG tablet Take 1 tablet (40 mg total) by mouth daily.   rosuvastatin (CRESTOR) 20 MG tablet TAKE 1 TABLET BY MOUTH DAILY   spironolactone (ALDACTONE) 25 MG tablet TAKE 1 TABLET BY  MOUTH DAILY   SYNTHROID 88 MCG tablet TAKE 1 TABLET BY MOUTH DAILY   valsartan (DIOVAN) 160 MG tablet TAKE 1 TABLET BY MOUTH DAILY   [DISCONTINUED] colchicine 0.6 MG tablet TAKE 1 TABLET BY MOUTH DAILY     Allergies:   Sulfa antibiotics   Social History   Socioeconomic History   Marital status: Married    Spouse name: Not on file   Number of children: Not on file   Years of education: Not on file   Highest education level: Not on file  Occupational History   Not on file  Tobacco Use   Smoking status: Former    Packs/day: 1.00    Years: 15.00    Additional pack years: 0.00    Total pack years: 15.00    Types: Cigarettes   Smokeless tobacco: Never  Vaping Use   Vaping Use: Never used  Substance and Sexual Activity   Alcohol use: Yes    Comment: wine 2x/wk   Drug use: No   Sexual activity: Yes    Birth control/protection: Post-menopausal  Other Topics Concern   Not on file  Social History Narrative   Not on file   Social Determinants of Health   Financial Resource Strain: Not on file  Food Insecurity: Food Insecurity Present (01/26/2022)   Hunger Vital Sign    Worried About Running Out of Food in the Last Year: Sometimes true    Ran Out of Food in the Last Year: Sometimes true  Transportation Needs: No Transportation Needs (01/26/2022)   PRAPARE - Administrator, Civil Service (Medical): No    Lack of Transportation (Non-Medical): No  Physical Activity: Not on file  Stress: Not on file  Social Connections: Not on file     Family History: The patient's family history includes Breast cancer (age of onset: 14) in her sister; Heart Problems in her father; Hyperlipidemia in her father; Hypertension in her father; Thyroid cancer in her mother; Uterine cancer (age of onset: 41) in her paternal grandmother.  ROS:   Please see the history of present illness.    + chest pain + shortness of breath All other systems reviewed and are negative.  Labs/Other  Studies Reviewed:    The following studies were reviewed today:  Echo 03/20/2020 LVEF 60 to 65%, no RWMA, mild concentric LVH, G1 DD Normal RV, mildly dilated LA Myxomatous mitral valve with mild regurgitation, mild prolapse of the mitral valve Slight increase in MR and left atrial size compared to TTE 05/04/2018 No evidence of pericardial effusion  Recent Labs: 06/01/2022: ALT 23; BUN 10; Creatinine, Ser 0.71; Hemoglobin 12.5; Platelets 272; Potassium 3.7; Sodium 133; TSH 1.672  Recent Lipid Panel    Component Value Date/Time   CHOL 183 03/12/2022 1118   TRIG 146 03/12/2022 1118   HDL 59 03/12/2022 1118   CHOLHDL 3.1  03/12/2022 1118   LDLCALC 99 03/12/2022 1118     Risk Assessment/Calculations:           Physical Exam:    VS:  BP 132/78   Pulse 74   Ht 5\' 6"  (1.676 m)   Wt 183 lb (83 kg)   SpO2 98%   BMI 29.54 kg/m     Wt Readings from Last 3 Encounters:  06/29/22 183 lb (83 kg)  06/01/22 180 lb (81.6 kg)  03/12/22 184 lb (83.5 kg)     GEN:  Well nourished, well developed in no acute distress HEENT: Normal NECK: No JVD; No carotid bruits CARDIAC: RRR, no murmurs, rubs, gallops RESPIRATORY:  Clear to auscultation without rales, wheezing or rhonchi  ABDOMEN: Soft, non-tender, non-distended MUSCULOSKELETAL:  No edema; No deformity. 2+ pedal pulses, equal bilaterally SKIN: Warm and dry NEUROLOGIC:  Alert and oriented x 3 PSYCHIATRIC:  Normal affect   EKG:  EKG is ordered today.  The ekg ordered today demonstrates normal sinus rhythm at 74 bpm, no ST abnormality       Diagnoses:    1. Mitral valve prolapse   2. Idiopathic pericarditis, unspecified chronicity   3. Mild mitral regurgitation   4. Precordial pain    Assessment and Plan:     Chest pain: Left sided chest pain x 3 days. Pain is almost constant and does not radiate. No associated symptoms. Feels like symptoms associated with previous pericarditis. No dyspnea, orthopnea, PND, edema,  palpitations. No improvement with ibuprofen. Will get echocardiogram for further evaluation. Consider CT calcium score in the future for risk stratification.   Pericarditis: History of idiopathic pericarditis 2019-02/2020.  Recent onset of chest pain as noted above, feels consistent with prior episodes of pericarditis.  Will get echocardiogram for evaluation of pericardial effusion.  Increase colchicine to 0.6 mg twice daily.  MVP: History of myxomatous mitral valve, mild MR, mild prolapse of mitral valve on echo 02/2020. No significant murmur on exam. Will get repeat limited echo in the setting of concern for return pericarditis.      Disposition: 1 month with me  Medication Adjustments/Labs and Tests Ordered: Current medicines are reviewed at length with the patient today.  Concerns regarding medicines are outlined above.  Orders Placed This Encounter  Procedures   EKG 12-Lead   Meds ordered this encounter  Medications   colchicine 0.6 MG tablet    Sig: Take 1 tablet (0.6 mg total) by mouth 2 (two) times daily.    Dispense:  180 tablet    Refill:  3    Patient Instructions  Medication Instructions:   INCREASE Colchicine one (1) tablet by mouth (0.6 mg) twice daily.   *If you need a refill on your cardiac medications before your next appointment, please call your pharmacy*   Lab Work:  None ordered.  If you have labs (blood work) drawn today and your tests are completely normal, you will receive your results only by: MyChart Message (if you have MyChart) OR A paper copy in the mail If you have any lab test that is abnormal or we need to change your treatment, we will call you to review the results.   Testing/Procedures:  Your physician has requested that you have an echocardiogram. Echocardiography is a painless test that uses sound waves to create images of your heart. It provides your doctor with information about the size and shape of your heart and how well your  heart's chambers and valves are working. This procedure  takes approximately one hour. There are no restrictions for this procedure. Please do NOT wear perfume or lotions (deodorant is allowed). Please arrive 15 minutes prior to your appointment time.    Follow-Up: At [facility], you and your health needs are our priority.  As part of our continuing mission to provide you with exceptional heart care, we have created designated Provider Care Teams.  These Care Teams include your primary Cardiologist (physician) and Advanced Practice Providers (APPs -  Physician Assistants and Nurse Practitioners) who all work together to provide you with the care you need, when you need it.  We recommend signing up for the patient portal called "MyChart".  Sign up information is provided on this After Visit Summary.  MyChart is used to connect with patients for Virtual Visits (Telemedicine).  Patients are able to view lab/test results, encounter notes, upcoming appointments, etc.  Non-urgent messages can be sent to your provider as well.   To learn more about what you can do with MyChart, go to [email or web address].    Your next appointment:   1 month(s)  Provider:   Eligha BridegroomMichelle Ardel Jagger, NP            Signed, Levi AlandSwinyer, Eleftherios Dudenhoeffer M, NP  06/29/2022 1:57 PM    [facility]

## 2022-06-29 NOTE — Patient Instructions (Signed)
Medication Instructions:   INCREASE Colchicine one (1) tablet by mouth (0.6 mg) twice daily.   *If you need a refill on your cardiac medications before your next appointment, please call your pharmacy*   Lab Work:  None ordered.  If you have labs (blood work) drawn today and your tests are completely normal, you will receive your results only by: MyChart Message (if you have MyChart) OR A paper copy in the mail If you have any lab test that is abnormal or we need to change your treatment, we will call you to review the results.   Testing/Procedures:  Your physician has requested that you have an echocardiogram. Echocardiography is a painless test that uses sound waves to create images of your heart. It provides your doctor with information about the size and shape of your heart and how well your heart's chambers and valves are working. This procedure takes approximately one hour. There are no restrictions for this procedure. Please do NOT wear perfume or lotions (deodorant is allowed). Please arrive 15 minutes prior to your appointment time.    Follow-Up: At [facility], you and your health needs are our priority.  As part of our continuing mission to provide you with exceptional heart care, we have created designated Provider Care Teams.  These Care Teams include your primary Cardiologist (physician) and Advanced Practice Providers (APPs -  Physician Assistants and Nurse Practitioners) who all work together to provide you with the care you need, when you need it.  We recommend signing up for the patient portal called "MyChart".  Sign up information is provided on this After Visit Summary.  MyChart is used to connect with patients for Virtual Visits (Telemedicine).  Patients are able to view lab/test results, encounter notes, upcoming appointments, etc.  Non-urgent messages can be sent to your provider as well.   To learn more about what you can do with MyChart, go to  [email or web address].    Your next appointment:   1 month(s)  Provider:   Eligha Bridegroom, NP

## 2022-06-30 ENCOUNTER — Other Ambulatory Visit: Payer: Self-pay | Admitting: *Deleted

## 2022-06-30 DIAGNOSIS — R072 Precordial pain: Secondary | ICD-10-CM

## 2022-06-30 MED ORDER — METOPROLOL TARTRATE 100 MG PO TABS: ORAL_TABLET | ORAL | 0 refills | Status: DC

## 2022-07-01 ENCOUNTER — Ambulatory Visit: Payer: Federal, State, Local not specified - PPO | Attending: Nurse Practitioner

## 2022-07-01 ENCOUNTER — Telehealth (HOSPITAL_COMMUNITY): Payer: Self-pay | Admitting: *Deleted

## 2022-07-01 DIAGNOSIS — R072 Precordial pain: Secondary | ICD-10-CM

## 2022-07-01 NOTE — Telephone Encounter (Signed)
Reaching out to patient to offer assistance regarding upcoming cardiac imaging study; pt verbalizes understanding of appt date/time, parking situation and where to check in, pre-test NPO status and medications ordered, and verified current allergies; name and call back number provided for further questions should they arise  Jazmen Lindenbaum RN Navigator Cardiac Imaging [facility] [phone] office [phone] cell  Patient to take 100mg metoprolol tartrate two hours prior to her cardiac CT scan.  She is aware to arrive at 11am. 

## 2022-07-02 ENCOUNTER — Ambulatory Visit (HOSPITAL_COMMUNITY)
Admission: RE | Admit: 2022-07-02 | Discharge: 2022-07-02 | Disposition: A | Discharge status: Home / Self Care | Payer: Medicare Other | Source: Ambulatory Visit | Attending: Nurse Practitioner | Admitting: Nurse Practitioner

## 2022-07-02 ENCOUNTER — Encounter (HOSPITAL_COMMUNITY): Payer: Self-pay

## 2022-07-02 DIAGNOSIS — R072 Precordial pain: Secondary | ICD-10-CM | POA: Insufficient documentation

## 2022-07-02 DIAGNOSIS — I2510 Atherosclerotic heart disease of native coronary artery without angina pectoris: Secondary | ICD-10-CM | POA: Diagnosis not present

## 2022-07-02 LAB — BASIC METABOLIC PANEL
BUN/Creatinine Ratio: 14 (ref 12–28)
BUN: 12 mg/dL (ref 8–27)
CO2: 24 mmol/L (ref 20–29)
Calcium: 9.3 mg/dL (ref 8.7–10.3)
Chloride: 104 mmol/L (ref 96–106)
Creatinine, Ser: 0.88 mg/dL (ref 0.57–1.00)
Glucose: 99 mg/dL (ref 70–99)
Potassium: 4.2 mmol/L (ref 3.5–5.2)
Sodium: 143 mmol/L (ref 134–144)
eGFR: 73 mL/min/{1.73_m2} (ref >59)

## 2022-07-02 MED ORDER — NITROGLYCERIN 0.4 MG SL SUBL
SUBLINGUAL_TABLET | SUBLINGUAL | Status: AC
  Filled 2022-07-02: qty 2

## 2022-07-02 MED ORDER — NITROGLYCERIN 0.4 MG SL SUBL
0.8000 mg | SUBLINGUAL_TABLET | Freq: Once | SUBLINGUAL | Status: AC
  Administered 2022-07-02: 0.8 mg via SUBLINGUAL

## 2022-07-02 MED ORDER — IOHEXOL 350 MG/ML SOLN
95.0000 mL | Freq: Once | INTRAVENOUS | Status: AC | PRN
  Administered 2022-07-02: 95 mL via INTRAVENOUS

## 2022-07-02 MED ORDER — METOPROLOL TARTRATE 5 MG/5ML IV SOLN
INTRAVENOUS | Status: AC
  Filled 2022-07-02: qty 5

## 2022-07-03 ENCOUNTER — Ambulatory Visit (HOSPITAL_BASED_OUTPATIENT_CLINIC_OR_DEPARTMENT_OTHER): Payer: Medicare Other | Admitting: Family

## 2022-07-30 ENCOUNTER — Ambulatory Visit: Payer: Medicare Other | Attending: Nurse Practitioner | Admitting: Nurse Practitioner

## 2022-07-30 ENCOUNTER — Encounter: Payer: Self-pay | Admitting: Nurse Practitioner

## 2022-07-30 VITALS — BP 122/64 | HR 70 | Ht 66.0 in | Wt 184.4 lb

## 2022-07-30 DIAGNOSIS — I341 Nonrheumatic mitral (valve) prolapse: Secondary | ICD-10-CM | POA: Diagnosis not present

## 2022-07-30 DIAGNOSIS — I300 Acute nonspecific idiopathic pericarditis: Secondary | ICD-10-CM | POA: Diagnosis not present

## 2022-07-30 DIAGNOSIS — E782 Mixed hyperlipidemia: Secondary | ICD-10-CM | POA: Insufficient documentation

## 2022-07-30 DIAGNOSIS — I10 Essential (primary) hypertension: Secondary | ICD-10-CM | POA: Insufficient documentation

## 2022-07-30 DIAGNOSIS — I340 Nonrheumatic mitral (valve) insufficiency: Secondary | ICD-10-CM | POA: Diagnosis not present

## 2022-07-30 DIAGNOSIS — I2510 Atherosclerotic heart disease of native coronary artery without angina pectoris: Secondary | ICD-10-CM | POA: Insufficient documentation

## 2022-07-30 MED ORDER — EZETIMIBE 10 MG PO TABS: 10.0000 mg | ORAL_TABLET | Freq: Every day | ORAL | 3 refills | Status: DC

## 2022-07-30 MED ORDER — COLCHICINE 0.6 MG PO TABS: 0.6000 mg | ORAL_TABLET | Freq: Every day | ORAL | 3 refills | Status: DC | PRN

## 2022-07-30 NOTE — Patient Instructions (Signed)
Medication Instructions:   START Zetia one (1) tablet by mouth ( 10 mg ) daily.   *If you need a refill on your cardiac medications before your next appointment, please call your pharmacy*  Lab Work:  Your physician recommends that you return for a FASTING lipid profile/alt on Friday, August 9.  You can come in on the day of your appointment anytime between 7:30-4:30 fasting from midnight the night before.   If you have labs (blood work) drawn today and your tests are completely normal, you will receive your results only by: MyChart Message (if you have MyChart) OR A paper copy in the mail If you have any lab test that is abnormal or we need to change your treatment, we will call you to review the results.   Testing/Procedures:  None ordered.   Follow-Up: At [facility], you and your health needs are our priority.  As part of our continuing mission to provide you with exceptional heart care, we have created designated Provider Care Teams.  These Care Teams include your primary Cardiologist (physician) and Advanced Practice Providers (APPs -  Physician Assistants and Nurse Practitioners) who all work together to provide you with the care you need, when you need it.  We recommend signing up for the patient portal called "MyChart".  Sign up information is provided on this After Visit Summary.  MyChart is used to connect with patients for Virtual Visits (Telemedicine).  Patients are able to view lab/test results, encounter notes, upcoming appointments, etc.  Non-urgent messages can be sent to your provider as well.   To learn more about what you can do with MyChart, go to [email or web address].    Your next appointment:   1 year(s)  Provider:   Meriam Sprague, MD     Other Instructions  Your physician wants you to follow-up in: 1 year with Dr. Shari Prows. You will receive a reminder letter in the mail two months in advance. If you don't receive a letter, please call our  office to schedule the follow-up appointment.  Mediterranean Diet A Mediterranean diet refers to food and lifestyle choices that are based on the traditions of countries located on the [employer]. It focuses on eating more fruits, vegetables, whole grains, beans, nuts, seeds, and heart-healthy fats, and eating less dairy, meat, eggs, and processed foods with added sugar, salt, and fat. This way of eating has been shown to help prevent certain conditions and improve outcomes for people who have chronic diseases, like kidney disease and heart disease. What are tips for following this plan? Reading food labels Check the serving size of packaged foods. For foods such as rice and pasta, the serving size refers to the amount of cooked product, not dry. Check the total fat in packaged foods. Avoid foods that have saturated fat or trans fats. Check the ingredient list for added sugars, such as corn syrup. Shopping  Buy a variety of foods that offer a balanced diet, including: Fresh fruits and vegetables (produce). Grains, beans, nuts, and seeds. Some of these may be available in unpackaged forms or large amounts (in bulk). Fresh seafood. Poultry and eggs. Low-fat dairy products. Buy whole ingredients instead of prepackaged foods. Buy fresh fruits and vegetables in-season from local farmers markets. Buy plain frozen fruits and vegetables. If you do not have access to quality fresh seafood, buy precooked frozen shrimp or canned fish, such as tuna, salmon, or sardines. Stock your pantry so you always have certain foods on hand, such  as olive oil, canned tuna, canned tomatoes, rice, pasta, and beans. Cooking Cook foods with extra-virgin olive oil instead of using butter or other vegetable oils. Have meat as a side dish, and have vegetables or grains as your main dish. This means having meat in small portions or adding small amounts of meat to foods like pasta or stew. Use beans or vegetables  instead of meat in common dishes like chili or lasagna. Experiment with different cooking methods. Try roasting, broiling, steaming, and sauting vegetables. Add frozen vegetables to soups, stews, pasta, or rice. Add nuts or seeds for added healthy fats and plant protein at each meal. You can add these to yogurt, salads, or vegetable dishes. Marinate fish or vegetables using olive oil, lemon juice, garlic, and fresh herbs. Meal planning Plan to eat one vegetarian meal one day each week. Try to work up to two vegetarian meals, if possible. Eat seafood two or more times a week. Have healthy snacks readily available, such as: Vegetable sticks with hummus. Greek yogurt. Fruit and nut trail mix. Eat balanced meals throughout the week. This includes: Fruit: 2-3 servings a day. Vegetables: 4-5 servings a day. Low-fat dairy: 2 servings a day. Fish, poultry, or lean meat: 1 serving a day. Beans and legumes: 2 or more servings a week. Nuts and seeds: 1-2 servings a day. Whole grains: 6-8 servings a day. Extra-virgin olive oil: 3-4 servings a day. Limit red meat and sweets to only a few servings a month. Lifestyle  Cook and eat meals together with your family, when possible. Drink enough fluid to keep your urine pale yellow. Be physically active every day. This includes: Aerobic exercise like running or swimming. Leisure activities like gardening, walking, or housework. Get 7-8 hours of sleep each night. If recommended by your health care provider, drink red wine in moderation. This means 1 glass a day for nonpregnant women and 2 glasses a day for men. A glass of wine equals 5 oz (150 mL). What foods should I eat? Fruits Apples. Apricots. Avocado. Berries. Bananas. Cherries. Dates. Figs. Grapes. Lemons. Melon. Oranges. Peaches. Plums. Pomegranate. Vegetables Artichokes. Beets. Broccoli. Cabbage. Carrots. Eggplant. Green beans. Chard. Kale. Spinach. Onions. Leeks. Peas. Squash. Tomatoes.  Peppers. Radishes. Grains Whole-grain pasta. Brown rice. Bulgur wheat. Polenta. Couscous. Whole-wheat bread. Orpah Cobb. Meats and other proteins Beans. Almonds. Sunflower seeds. Pine nuts. Peanuts. Cod. Salmon. Scallops. Shrimp. Tuna. Tilapia. Clams. Oysters. Eggs. Poultry without skin. Dairy Low-fat milk. Cheese. Greek yogurt. Fats and oils Extra-virgin olive oil. Avocado oil. Grapeseed oil. Beverages Water. Red wine. Herbal tea. Sweets and desserts Greek yogurt with honey. Baked apples. Poached pears. Trail mix. Seasonings and condiments Basil. Cilantro. Coriander. Cumin. Mint. Parsley. Sage. Rosemary. Tarragon. Garlic. Oregano. Thyme. Pepper. Balsamic vinegar. Tahini. Hummus. Tomato sauce. Olives. Mushrooms. The items listed above may not be a complete list of foods and beverages you can eat. Contact a dietitian for more information. What foods should I limit? This is a list of foods that should be eaten rarely or only on special occasions. Fruits Fruit canned in syrup. Vegetables Deep-fried potatoes (french fries). Grains Prepackaged pasta or rice dishes. Prepackaged cereal with added sugar. Prepackaged snacks with added sugar. Meats and other proteins Beef. Pork. Lamb. Poultry with skin. Hot dogs. Tomasa Blase. Dairy Ice cream. Sour cream. Whole milk. Fats and oils Butter. Canola oil. Vegetable oil. Beef fat (tallow). Lard. Beverages Juice. Sugar-sweetened soft drinks. Beer. Liquor and spirits. Sweets and desserts Cookies. Cakes. Pies. Candy. Seasonings and condiments Mayonnaise. Pre-made sauces  and marinades. The items listed above may not be a complete list of foods and beverages you should limit. Contact a dietitian for more information. Summary The Mediterranean diet includes both food and lifestyle choices. Eat a variety of fresh fruits and vegetables, beans, nuts, seeds, and whole grains. Limit the amount of red meat and sweets that you eat. If recommended by your  health care provider, drink red wine in moderation. This means 1 glass a day for nonpregnant women and 2 glasses a day for men. A glass of wine equals 5 oz (150 mL). This information is not intended to replace advice given to you by your health care provider. Make sure you discuss any questions you have with your health care provider. Document Revised: 04/14/2019 Document Reviewed: 02/09/2019 Elsevier Patient Education  2023 Elsevier Inc. Adopting a Healthy Lifestyle.   Weight: Know what a healthy weight is for you (roughly BMI <25) and aim to maintain this. You can calculate your body mass index on your smart phone  Diet: Aim for 7+ servings of fruits and vegetables daily Limit animal fats in diet for cholesterol and heart health - choose grass fed whenever available Avoid highly processed foods (fast food burgers, tacos, fried chicken, pizza, hot dogs, french fries)  Saturated fat comes in the form of butter, lard, coconut oil, margarine, partially hydrogenated oils, and fat in meat. These increase your risk of cardiovascular disease.  Use healthy plant oils, such as olive, canola, soy, corn, sunflower and peanut.  Whole foods such as fruits, vegetables and whole grains have fiber  Men need > 38 grams of fiber per day Women need > 25 grams of fiber per day  Load up on vegetables and fruits - one-half of your plate: Aim for color and variety, and remember that potatoes dont count. Go for whole grains - one-quarter of your plate: Whole wheat, barley, wheat berries, quinoa, oats, brown rice, and foods made with them. If you want pasta, go with whole wheat pasta. Protein power - one-quarter of your plate: Fish, chicken, beans, and nuts are all healthy, versatile protein sources. Limit red meat. You need carbohydrates for energy! The type of carbohydrate is more important than the amount. Choose carbohydrates such as vegetables, fruits, whole grains, beans, and nuts in the place of white rice,  white pasta, potatoes (baked or fried), macaroni and cheese, cakes, cookies, and donuts.  If youre thirsty, drink water. Coffee and tea are good in moderation, but skip sugary drinks and limit milk and dairy products to one or two daily servings. Keep sugar intake at 6 teaspoons or 24 grams or LESS       Exercise: Aim for 150 min of moderate intensity exercise weekly for heart health, and weights twice weekly for bone health Stay active - any steps are better than no steps! Aim for 7-9 hours of sleep daily

## 2022-07-30 NOTE — Progress Notes (Signed)
Cardiology Office Note:    Date:  07/30/2022   ID:  Stephanie Dudley, DOB [birth year], MRN [id number]  PCP:  Dorothyann Peng, MD   [facility] Providers Cardiologist:  Meriam Sprague, MD Cardiology APP:  Kennon Rounds     Referring MD: Dorothyann Peng, MD   Chief Complaint: chest pain  History of Present Illness:    Stephanie Dudley is a very pleasant 67 y.o. female with a hx of recurrent pericarditis with associated pericardial effusion (01/2018, 03/2018, 02/2020, HTN, HLD, mild MVP, hypothyroidism, breast cancer s/p right lumpectomy.  Admitted 01/2018 with idiopathic pericarditis and had poor response to NSAIDs.  She was instead treated with steroid taper.  Had abnormal RF but rheumatology did not feel she warranted further evaluation.  Repeat pericarditis 03/2018 with longer steroid taper and colchicine for 6 months.  Recurrent pericarditis 02/2020.  Seen by Gillian Shields, NP on 12/2020 where she was started on prednisone for concern for possible RA.  At follow-up 06/11/2021 amlodipine was stopped due to hypotension.  She declined coronary calcium score.  Last cardiology clinic visit was 12/01/21 with Dr. Shari Prows.  She reported occasional LE edema for which she takes Lasix and occasional palpitations.  BP was well-controlled.  She has been continued on colchicine 0.6 mg daily.  Seen by me on 06/29/22 as a late add-on to my schedule for due to chest pain that she feels may be return of pericarditis. Describes discomfort as "constant ache." Pain started two days ago while watching television. Pain has been intermittent since that time and is located in left chest. It is difficult to get comfortable when she is lying down. Took some ibuprofen with no improvement. Continues to take colchicine once daily. Symptoms not bad enough to go to ED. Previous ED visit 06/01/22 was in the setting of her father dying, was exhausted, had not eaten and thought she was going to pass out.  Reports she frequently feels short of breath when "running around." Has palpitations at night when lying down.  No orthopnea, PND, edema, presyncope, syncope. No recent illness. We were able to get a limited echo the same day which did not reveal any evidence of pericardial effusion.  Normal LVEF 60 to 65%, no RWMA, G1 DD, normal RV size and function, no significant valve disease.  Recommendation to undergo coronary CTA for mapping of coronary anatomy which revealed coronary calcium score of 6.71 with mild nonobstructive CAD (25 to 49%) in the LAD.  She had a small hiatal hernia noted as well.  Encouraged her to monitor symptoms in the setting of timing and proximity to food intake and return for follow-up in 1 month.  Today, she is here for follow-up. Reports she is feeling well. She has occasional episodes of chest discomfort that she feels are associated with eating.  Was on prescription medication for GERD in the past but insurance did not cover it.  She takes Pepcid regularly but does not feel like it is as effective, plans to see PCP in one month.  She denies symptoms of chest discomfort associated with pericarditis in the past. She denies shortness of breath, palpitations, orthopnea, PND, edema. Lengthy discussion about findings of coronary CT, heart healthy diet, and cardiovascular risk.  Past Medical History:  Diagnosis Date   Arthritis    Breast mass, right 08/2017   Benign tissue   Complication of anesthesia    hard to wake up with thyroid surgery   Family history of breast  cancer    Family history of thyroid cancer    Family history of uterine cancer    Hyperlipidemia    Hypertension    Hypothyroidism    Mitral valve prolapse    a. mild by echo 04/2018.// Echocardiogram 12/21: EF 60-65, no RWMA, mild LVH, GR 1 DD, normal RVSF, mild LAE, myxomatous MV, mild MR, mild MVP, no pericardial effusion    Obesity    Pericardial effusion    Pericarditis    Peripheral edema    ankles, feet     Past Surgical History:  Procedure Laterality Date   APPENDECTOMY  1994   BREAST BIOPSY Left 1999   BREAST LUMPECTOMY WITH RADIOACTIVE SEED LOCALIZATION Right 09/28/2017   Procedure: RIGHT BREAST LUMPECTOMY WITH RADIOACTIVE SEED LOCALIZATION;  Surgeon: Harriette Bouillon, MD;  Location: [facility];  Service: General;  Laterality: Right;   BREAST SURGERY     KNEE ARTHROSCOPY Left    MIDDLE EAR SURGERY Left 2011   THYROIDECTOMY  1974   goiter--total thyroidectomy   TONSILLECTOMY  1972   WRIST FRACTURE SURGERY      Current Medications: Current Meds  Medication Sig   ALPRAZolam (XANAX) 0.25 MG tablet Take 1 tablet (0.25 mg total) by mouth 2 (two) times daily as needed for anxiety.   carvedilol (COREG) 12.5 MG tablet Take 1 tablet (12.5 mg total) by mouth 2 (two) times daily.   escitalopram (LEXAPRO) 10 MG tablet TAKE 1 TABLET BY MOUTH DAILY   ezetimibe (ZETIA) 10 MG tablet Take 1 tablet (10 mg total) by mouth daily.   famotidine (PEPCID) 20 MG tablet TAKE 1 TABLET BY MOUTH DAILY   furosemide (LASIX) 40 MG tablet Take 1 tablet (40 mg total) by mouth daily.   metoprolol tartrate (LOPRESSOR) 100 MG tablet Take one (1) tablet by mouth ( 100 mg) 2 hours prior to CT scan.   rosuvastatin (CRESTOR) 20 MG tablet TAKE 1 TABLET BY MOUTH DAILY   spironolactone (ALDACTONE) 25 MG tablet TAKE 1 TABLET BY MOUTH DAILY   SYNTHROID 88 MCG tablet TAKE 1 TABLET BY MOUTH DAILY   valsartan (DIOVAN) 160 MG tablet TAKE 1 TABLET BY MOUTH DAILY   [DISCONTINUED] colchicine 0.6 MG tablet Take 1 tablet (0.6 mg total) by mouth 2 (two) times daily.   colchicine 0.6 MG tablet Take 1 tablet (0.6 mg total) by mouth daily as needed.     Allergies:   Sulfa antibiotics   Social History   Socioeconomic History   Marital status: Married    Spouse name: Not on file   Number of children: Not on file   Years of education: Not on file   Highest education level: Not on file  Occupational History   Not on  file  Tobacco Use   Smoking status: Former    Packs/day: 1.00    Years: 15.00    Additional pack years: 0.00    Total pack years: 15.00    Types: Cigarettes   Smokeless tobacco: Never  Vaping Use   Vaping Use: Never used  Substance and Sexual Activity   Alcohol use: Yes    Comment: wine 2x/wk   Drug use: No   Sexual activity: Yes    Birth control/protection: Post-menopausal  Other Topics Concern   Not on file  Social History Narrative   Not on file   Social Determinants of Health   Financial Resource Strain: Not on file  Food Insecurity: Food Insecurity Present (01/26/2022)   Hunger Vital Sign  Worried About Programme researcher, broadcasting/film/video in the Last Year: Sometimes true    [employer] of Food in the Last Year: Sometimes true  Transportation Needs: No Transportation Needs (01/26/2022)   PRAPARE - Administrator, Civil Service (Medical): No    Lack of Transportation (Non-Medical): No  Physical Activity: Not on file  Stress: Not on file  Social Connections: Not on file     Family History: The patient's family history includes Breast cancer (age of onset: 32) in her sister; Heart Problems in her father; Hyperlipidemia in her father; Hypertension in her father; Thyroid cancer in her mother; Uterine cancer (age of onset: 61) in her paternal grandmother.  ROS:   Please see the history of present illness.  All other systems reviewed and are negative.  Labs/Other Studies Reviewed:    The following studies were reviewed today:  Coronary CTA 07/02/22 IMPRESSION: 1. Coronary calcium score of 6.71. This was 4.8 Percentile for age and sex matched control.   2. Normal coronary origin with right dominance.   3. CAD-RADS 2. Mild non-obstructive CAD (25-49%). Consider non-atherosclerotic causes of chest pain. Consider preventive therapy and risk factor modification.  Limited Echo 06/29/22 1. Left ventricular ejection fraction, by estimation, is 60 to 65%. The  left ventricle has  normal function. The left ventricle has no regional  wall motion abnormalities. Left ventricular diastolic parameters are  consistent with Grade I diastolic  dysfunction (impaired relaxation). The average left ventricular global  longitudinal strain is -21.0 %. The global longitudinal strain is normal.   2. Right ventricular systolic function is normal. The right ventricular  size is normal.   3. The mitral valve is normal in structure. Trivial mitral valve  regurgitation. No evidence of mitral stenosis.   4. The aortic valve is normal in structure. Aortic valve regurgitation is  not visualized. No aortic stenosis is present.   5. The inferior vena cava is normal in size with greater than 50%  respiratory variability, suggesting right atrial pressure of 3 mmHg.    Echo 03/20/2020 LVEF 60 to 65%, no RWMA, mild concentric LVH, G1 DD Normal RV, mildly dilated LA Myxomatous mitral valve with mild regurgitation, mild prolapse of the mitral valve Slight increase in MR and left atrial size compared to TTE 05/04/2018 No evidence of pericardial effusion  Recent Labs: 06/01/2022: ALT 23; Hemoglobin 12.5; Platelets 272; TSH 1.672 07/01/2022: BUN 12; Creatinine, Ser 0.88; Potassium 4.2; Sodium 143  Recent Lipid Panel    Component Value Date/Time   CHOL 183 03/12/2022 1118   TRIG 146 03/12/2022 1118   HDL 59 03/12/2022 1118   CHOLHDL 3.1 03/12/2022 1118   LDLCALC 99 03/12/2022 1118     Risk Assessment/Calculations:           Physical Exam:    VS:  BP 122/64   Pulse 70   Ht 5\' 6"  (1.676 m)   Wt 184 lb 6.4 oz (83.6 kg)   SpO2 97%   BMI 29.76 kg/m     Wt Readings from Last 3 Encounters:  07/30/22 184 lb 6.4 oz (83.6 kg)  06/29/22 183 lb (83 kg)  06/01/22 180 lb (81.6 kg)     GEN:  Well nourished, well developed in no acute distress HEENT: Normal NECK: No JVD; No carotid bruits CARDIAC: RRR, no murmurs, rubs, gallops RESPIRATORY:  Clear to auscultation without rales, wheezing  or rhonchi  ABDOMEN: Soft, non-tender, non-distended MUSCULOSKELETAL:  No edema; No deformity. 2+ pedal pulses, equal  bilaterally SKIN: Warm and dry NEUROLOGIC:  Alert and oriented x 3 PSYCHIATRIC:  Normal affect   EKG:  EKG is not ordered today       Diagnoses:    1. Mitral valve prolapse   2. Mild mitral regurgitation   3. Essential hypertension   4. Mixed hyperlipidemia   5. Idiopathic pericarditis, unspecified chronicity   6. Coronary artery disease involving native coronary artery of native heart without angina pectoris     Assessment and Plan:     CAD without angina: Coronary CTA 07/02/2022 revealed focal mild (25 to 49%) calcified plaque in the proximal LAD, coronary calcium score 6.71 (48th percentile age/sex/race controls). She denies chest pain, dyspnea, or other symptoms concerning for angina.  No indication for further ischemic evaluation at this time.  Emphasized the importance of LDL < 70. LDL 99 on 03/12/22. Has been on rosuvastatin 20 mg for years per her report.  We will add ezetimibe as noted below.  Encouraged secondary prevention including heart healthy, mostly plant-based diet and regular exercise.  Hyperlipidemia LDL goal < 70: LDL 99 on 03/12/2022.  As noted above she has mild CAD noted on coronary CTA.  Reports she has been taking rosuvastatin 20 mg for years.  We will add ezetimibe 10 mg daily and recheck fasting lipids in 2 to 3 months. Encouraged heart healthy, mostly plant based diet.   Chest pain: Chest pain consistent with gastric reflux and occurs most often with eating certain foods. Small hiatal hernia noted on coronary CT 07/02/2022. Currently taking Pepcid with some relief. Encouraged her to follow-up with PCP.  Pericarditis: History of recurrent idiopathic pericarditis 2019-02/2020. Seen 06/29/22 for symptoms consistent with prior episodes of pericarditis.  Limited echo completed that day revealed no evidence of pericardial effusion.  Chest pain has  improved and is more associated with certain foods. She can decrease colchicine to as needed from twice daily dosing.   MVP/MR: Normal mitral valve with trivial MR, no evidence of stenosis on limited echo 06/29/2022. History of myxomatous mitral valve, mild MR, mild prolapse of mitral valve on echo 02/2020. No significant murmur on exam. We will continue to follow clinically for now.   Disposition: 1 year with Dr. Shari Prows   Medication Adjustments/Labs and Tests Ordered: Current medicines are reviewed at length with the patient today.  Concerns regarding medicines are outlined above.  Orders Placed This Encounter  Procedures   Lipid Profile   ALT   Meds ordered this encounter  Medications   ezetimibe (ZETIA) 10 MG tablet    Sig: Take 1 tablet (10 mg total) by mouth daily.    Dispense:  90 tablet    Refill:  3   colchicine 0.6 MG tablet    Sig: Take 1 tablet (0.6 mg total) by mouth daily as needed.    Dispense:  60 tablet    Refill:  3    Order Specific Question:   Supervising Provider    Answer:   Vesta Mixer [phone]    Patient Instructions  Medication Instructions:   START Zetia one (1) tablet by mouth ( 10 mg ) daily.   *If you need a refill on your cardiac medications before your next appointment, please call your pharmacy*  Lab Work:  Your physician recommends that you return for a FASTING lipid profile/alt on Friday, August 9.  You can come in on the day of your appointment anytime between 7:30-4:30 fasting from midnight the night before.   If you have  labs (blood work) drawn today and your tests are completely normal, you will receive your results only by: MyChart Message (if you have MyChart) OR A paper copy in the mail If you have any lab test that is abnormal or we need to change your treatment, we will call you to review the results.   Testing/Procedures:  None ordered.   Follow-Up: At [facility], you and your health needs are our priority.   As part of our continuing mission to provide you with exceptional heart care, we have created designated Provider Care Teams.  These Care Teams include your primary Cardiologist (physician) and Advanced Practice Providers (APPs -  Physician Assistants and Nurse Practitioners) who all work together to provide you with the care you need, when you need it.  We recommend signing up for the patient portal called "MyChart".  Sign up information is provided on this After Visit Summary.  MyChart is used to connect with patients for Virtual Visits (Telemedicine).  Patients are able to view lab/test results, encounter notes, upcoming appointments, etc.  Non-urgent messages can be sent to your provider as well.   To learn more about what you can do with MyChart, go to [email or web address].    Your next appointment:   1 year(s)  Provider:   Meriam Sprague, MD     Other Instructions  Your physician wants you to follow-up in: 1 year with Dr. Shari Prows. You will receive a reminder letter in the mail two months in advance. If you don't receive a letter, please call our office to schedule the follow-up appointment.  Mediterranean Diet A Mediterranean diet refers to food and lifestyle choices that are based on the traditions of countries located on the [employer]. It focuses on eating more fruits, vegetables, whole grains, beans, nuts, seeds, and heart-healthy fats, and eating less dairy, meat, eggs, and processed foods with added sugar, salt, and fat. This way of eating has been shown to help prevent certain conditions and improve outcomes for people who have chronic diseases, like kidney disease and heart disease. What are tips for following this plan? Reading food labels Check the serving size of packaged foods. For foods such as rice and pasta, the serving size refers to the amount of cooked product, not dry. Check the total fat in packaged foods. Avoid foods that have saturated fat or trans  fats. Check the ingredient list for added sugars, such as corn syrup. Shopping  Buy a variety of foods that offer a balanced diet, including: Fresh fruits and vegetables (produce). Grains, beans, nuts, and seeds. Some of these may be available in unpackaged forms or large amounts (in bulk). Fresh seafood. Poultry and eggs. Low-fat dairy products. Buy whole ingredients instead of prepackaged foods. Buy fresh fruits and vegetables in-season from local farmers markets. Buy plain frozen fruits and vegetables. If you do not have access to quality fresh seafood, buy precooked frozen shrimp or canned fish, such as tuna, salmon, or sardines. Stock your pantry so you always have certain foods on hand, such as olive oil, canned tuna, canned tomatoes, rice, pasta, and beans. Cooking Cook foods with extra-virgin olive oil instead of using butter or other vegetable oils. Have meat as a side dish, and have vegetables or grains as your main dish. This means having meat in small portions or adding small amounts of meat to foods like pasta or stew. Use beans or vegetables instead of meat in common dishes like chili or lasagna. Experiment with different cooking  methods. Try roasting, broiling, steaming, and sauting vegetables. Add frozen vegetables to soups, stews, pasta, or rice. Add nuts or seeds for added healthy fats and plant protein at each meal. You can add these to yogurt, salads, or vegetable dishes. Marinate fish or vegetables using olive oil, lemon juice, garlic, and fresh herbs. Meal planning Plan to eat one vegetarian meal one day each week. Try to work up to two vegetarian meals, if possible. Eat seafood two or more times a week. Have healthy snacks readily available, such as: Vegetable sticks with hummus. Greek yogurt. Fruit and nut trail mix. Eat balanced meals throughout the week. This includes: Fruit: 2-3 servings a day. Vegetables: 4-5 servings a day. Low-fat dairy: 2 servings a  day. Fish, poultry, or lean meat: 1 serving a day. Beans and legumes: 2 or more servings a week. Nuts and seeds: 1-2 servings a day. Whole grains: 6-8 servings a day. Extra-virgin olive oil: 3-4 servings a day. Limit red meat and sweets to only a few servings a month. Lifestyle  Cook and eat meals together with your family, when possible. Drink enough fluid to keep your urine pale yellow. Be physically active every day. This includes: Aerobic exercise like running or swimming. Leisure activities like gardening, walking, or housework. Get 7-8 hours of sleep each night. If recommended by your health care provider, drink red wine in moderation. This means 1 glass a day for nonpregnant women and 2 glasses a day for men. A glass of wine equals 5 oz (150 mL). What foods should I eat? Fruits Apples. Apricots. Avocado. Berries. Bananas. Cherries. Dates. Figs. Grapes. Lemons. Melon. Oranges. Peaches. Plums. Pomegranate. Vegetables Artichokes. Beets. Broccoli. Cabbage. Carrots. Eggplant. Green beans. Chard. Kale. Spinach. Onions. Leeks. Peas. Squash. Tomatoes. Peppers. Radishes. Grains Whole-grain pasta. Brown rice. Bulgur wheat. Polenta. Couscous. Whole-wheat bread. Orpah Cobb. Meats and other proteins Beans. Almonds. Sunflower seeds. Pine nuts. Peanuts. Cod. Salmon. Scallops. Shrimp. Tuna. Tilapia. Clams. Oysters. Eggs. Poultry without skin. Dairy Low-fat milk. Cheese. Greek yogurt. Fats and oils Extra-virgin olive oil. Avocado oil. Grapeseed oil. Beverages Water. Red wine. Herbal tea. Sweets and desserts Greek yogurt with honey. Baked apples. Poached pears. Trail mix. Seasonings and condiments Basil. Cilantro. Coriander. Cumin. Mint. Parsley. Sage. Rosemary. Tarragon. Garlic. Oregano. Thyme. Pepper. Balsamic vinegar. Tahini. Hummus. Tomato sauce. Olives. Mushrooms. The items listed above may not be a complete list of foods and beverages you can eat. Contact a dietitian for more  information. What foods should I limit? This is a list of foods that should be eaten rarely or only on special occasions. Fruits Fruit canned in syrup. Vegetables Deep-fried potatoes (french fries). Grains Prepackaged pasta or rice dishes. Prepackaged cereal with added sugar. Prepackaged snacks with added sugar. Meats and other proteins Beef. Pork. Lamb. Poultry with skin. Hot dogs. Tomasa Blase. Dairy Ice cream. Sour cream. Whole milk. Fats and oils Butter. Canola oil. Vegetable oil. Beef fat (tallow). Lard. Beverages Juice. Sugar-sweetened soft drinks. Beer. Liquor and spirits. Sweets and desserts Cookies. Cakes. Pies. Candy. Seasonings and condiments Mayonnaise. Pre-made sauces and marinades. The items listed above may not be a complete list of foods and beverages you should limit. Contact a dietitian for more information. Summary The Mediterranean diet includes both food and lifestyle choices. Eat a variety of fresh fruits and vegetables, beans, nuts, seeds, and whole grains. Limit the amount of red meat and sweets that you eat. If recommended by your health care provider, drink red wine in moderation. This means 1 glass a day  for nonpregnant women and 2 glasses a day for men. A glass of wine equals 5 oz (150 mL). This information is not intended to replace advice given to you by your health care provider. Make sure you discuss any questions you have with your health care provider. Document Revised: 04/14/2019 Document Reviewed: 02/09/2019 Elsevier Patient Education  2023 Elsevier Inc. Adopting a Healthy Lifestyle.   Weight: Know what a healthy weight is for you (roughly BMI <25) and aim to maintain this. You can calculate your body mass index on your smart phone  Diet: Aim for 7+ servings of fruits and vegetables daily Limit animal fats in diet for cholesterol and heart health - choose grass fed whenever available Avoid highly processed foods (fast food burgers, tacos, fried  chicken, pizza, hot dogs, french fries)  Saturated fat comes in the form of butter, lard, coconut oil, margarine, partially hydrogenated oils, and fat in meat. These increase your risk of cardiovascular disease.  Use healthy plant oils, such as olive, canola, soy, corn, sunflower and peanut.  Whole foods such as fruits, vegetables and whole grains have fiber  Men need > 38 grams of fiber per day Women need > 25 grams of fiber per day  Load up on vegetables and fruits - one-half of your plate: Aim for color and variety, and remember that potatoes dont count. Go for whole grains - one-quarter of your plate: Whole wheat, barley, wheat berries, quinoa, oats, brown rice, and foods made with them. If you want pasta, go with whole wheat pasta. Protein power - one-quarter of your plate: Fish, chicken, beans, and nuts are all healthy, versatile protein sources. Limit red meat. You need carbohydrates for energy! The type of carbohydrate is more important than the amount. Choose carbohydrates such as vegetables, fruits, whole grains, beans, and nuts in the place of white rice, white pasta, potatoes (baked or fried), macaroni and cheese, cakes, cookies, and donuts.  If youre thirsty, drink water. Coffee and tea are good in moderation, but skip sugary drinks and limit milk and dairy products to one or two daily servings. Keep sugar intake at 6 teaspoons or 24 grams or LESS       Exercise: Aim for 150 min of moderate intensity exercise weekly for heart health, and weights twice weekly for bone health Stay active - any steps are better than no steps! Aim for 7-9 hours of sleep daily           Signed, Levi Aland, NP  07/30/2022 1:23 PM    [facility]

## 2022-08-21 ENCOUNTER — Other Ambulatory Visit: Payer: Self-pay | Admitting: Internal Medicine

## 2022-09-14 ENCOUNTER — Ambulatory Visit (INDEPENDENT_AMBULATORY_CARE_PROVIDER_SITE_OTHER): Payer: Medicare Other | Admitting: Internal Medicine

## 2022-09-14 ENCOUNTER — Encounter: Payer: Self-pay | Admitting: Internal Medicine

## 2022-09-14 VITALS — BP 118/78 | HR 69 | Temp 98.5°F | Ht 66.0 in | Wt 180.4 lb

## 2022-09-14 DIAGNOSIS — E2839 Other primary ovarian failure: Secondary | ICD-10-CM

## 2022-09-14 DIAGNOSIS — R7309 Other abnormal glucose: Secondary | ICD-10-CM

## 2022-09-14 DIAGNOSIS — Z0000 Encounter for general adult medical examination without abnormal findings: Secondary | ICD-10-CM | POA: Diagnosis not present

## 2022-09-14 DIAGNOSIS — I10 Essential (primary) hypertension: Secondary | ICD-10-CM | POA: Diagnosis not present

## 2022-09-14 DIAGNOSIS — E890 Postprocedural hypothyroidism: Secondary | ICD-10-CM | POA: Diagnosis not present

## 2022-09-14 DIAGNOSIS — Z79899 Other long term (current) drug therapy: Secondary | ICD-10-CM

## 2022-09-14 DIAGNOSIS — E663 Overweight: Secondary | ICD-10-CM

## 2022-09-14 DIAGNOSIS — L309 Dermatitis, unspecified: Secondary | ICD-10-CM

## 2022-09-14 DIAGNOSIS — E7800 Pure hypercholesterolemia, unspecified: Secondary | ICD-10-CM | POA: Diagnosis not present

## 2022-09-14 MED ORDER — TRIAMCINOLONE ACETONIDE 0.1 % EX CREA
TOPICAL_CREAM | CUTANEOUS | 0 refills | Status: DC
Start: 2022-09-14 — End: 2023-03-25

## 2022-09-14 MED ORDER — FAMOTIDINE 20 MG PO TABS: 20.0000 mg | ORAL_TABLET | Freq: Every day | ORAL | 2 refills | Status: DC

## 2022-09-14 NOTE — Patient Instructions (Addendum)
Stephanie Dudley , Thank you for taking time to come for your Medicare Wellness Visit. I appreciate your ongoing commitment to your health goals. Please review the following plan we discussed and let me know if I can assist you in the future.   These are the goals we discussed:  Goals      DIET - EAT MORE FRUITS AND VEGETABLES     She also wants to decrease her intake of red meats. Admits she doesn't eat a lot of fruit.      Live a Healthy life     She reports not having any personal goals. She feels she has done everything needed.         This is a list of the screening recommended for you and due dates:  Health Maintenance  Topic Date Due   Pap Smear  01/21/2021   COVID-19 Vaccine (6 - 2023-24 season) 11/21/2021   Pneumonia Vaccine (1 of 1 - PCV) Never done   DEXA scan (bone density measurement)  Never done   Flu Shot  10/22/2022   Medicare Annual Wellness Visit  09/14/2023   Mammogram  11/15/2023   DTaP/Tdap/Td vaccine (2 - Tdap) 03/09/2027   Colon Cancer Screening  07/27/2027   Hepatitis C Screening  Completed   HIV Screening  Completed   Zoster (Shingles) Vaccine  Completed   HPV Vaccine  Aged Out     Health Maintenance, Female Adopting a healthy lifestyle and getting preventive care are important in promoting health and wellness. Ask your health care provider about: The right schedule for you to have regular tests and exams. Things you can do on your own to prevent diseases and keep yourself healthy. What should I know about diet, weight, and exercise? Eat a healthy diet  Eat a diet that includes plenty of vegetables, fruits, low-fat dairy products, and lean protein. Do not eat a lot of foods that are high in solid fats, added sugars, or sodium. Maintain a healthy weight Body mass index (BMI) is used to identify weight problems. It estimates body fat based on height and weight. Your health care provider can help determine your BMI and help you achieve or maintain a  healthy weight. Get regular exercise Get regular exercise. This is one of the most important things you can do for your health. Most adults should: Exercise for at least 150 minutes each week. The exercise should increase your heart rate and make you sweat (moderate-intensity exercise). Do strengthening exercises at least twice a week. This is in addition to the moderate-intensity exercise. Spend less time sitting. Even light physical activity can be beneficial. Watch cholesterol and blood lipids Have your blood tested for lipids and cholesterol at 65 years of age, then have this test every 5 years. Have your cholesterol levels checked more often if: Your lipid or cholesterol levels are high. You are older than 65 years of age. You are at high risk for heart disease. What should I know about cancer screening? Depending on your health history and family history, you may need to have cancer screening at various ages. This may include screening for: Breast cancer. Cervical cancer. Colorectal cancer. Skin cancer. Lung cancer. What should I know about heart disease, diabetes, and high blood pressure? Blood pressure and heart disease High blood pressure causes heart disease and increases the risk of stroke. This is more likely to develop in people who have high blood pressure readings or are overweight. Have your blood pressure checked: Every 3-5  years if you are 89-50 years of age. Every year if you are 74 years old or older. Diabetes Have regular diabetes screenings. This checks your fasting blood sugar level. Have the screening done: Once every three years after age 27 if you are at a normal weight and have a low risk for diabetes. More often and at a younger age if you are overweight or have a high risk for diabetes. What should I know about preventing infection? Hepatitis B If you have a higher risk for hepatitis B, you should be screened for this virus. Talk with your health care  provider to find out if you are at risk for hepatitis B infection. Hepatitis C Testing is recommended for: Everyone born from [birth year] through 1963. Anyone with known risk factors for hepatitis C. Sexually transmitted infections (STIs) Get screened for STIs, including gonorrhea and chlamydia, if: You are sexually active and are younger than 65 years of age. You are older than 65 years of age and your health care provider tells you that you are at risk for this type of infection. Your sexual activity has changed since you were last screened, and you are at increased risk for chlamydia or gonorrhea. Ask your health care provider if you are at risk. Ask your health care provider about whether you are at high risk for HIV. Your health care provider may recommend a prescription medicine to help prevent HIV infection. If you choose to take medicine to prevent HIV, you should first get tested for HIV. You should then be tested every 3 months for as long as you are taking the medicine. Pregnancy If you are about to stop having your period (premenopausal) and you may become pregnant, seek counseling before you get pregnant. Take 400 to 800 micrograms (mcg) of folic acid every day if you become pregnant. Ask for birth control (contraception) if you want to prevent pregnancy. Osteoporosis and menopause Osteoporosis is a disease in which the bones lose minerals and strength with aging. This can result in bone fractures. If you are 53 years old or older, or if you are at risk for osteoporosis and fractures, ask your health care provider if you should: Be screened for bone loss. Take a calcium or vitamin D supplement to lower your risk of fractures. Be given hormone replacement therapy (HRT) to treat symptoms of menopause. Follow these instructions at home: Alcohol use Do not drink alcohol if: Your health care provider tells you not to drink. You are pregnant, may be pregnant, or are planning to become  pregnant. If you drink alcohol: Limit how much you have to: 0-1 drink a day. Know how much alcohol is in your drink. In the [place] one drink equals one 12 oz bottle of beer (355 mL), one 5 oz glass of wine (148 mL), or one 1 oz glass of hard liquor (44 mL). Lifestyle Do not use any products that contain nicotine or tobacco. These products include cigarettes, chewing tobacco, and vaping devices, such as e-cigarettes. If you need help quitting, ask your health care provider. Do not use street drugs. Do not share needles. Ask your health care provider for help if you need support or information about quitting drugs. General instructions Schedule regular health, dental, and eye exams. Stay current with your vaccines. Tell your health care provider if: You often feel depressed. You have ever been abused or do not feel safe at home. Summary Adopting a healthy lifestyle and getting preventive care are important in promoting health  and wellness. Follow your health care provider's instructions about healthy diet, exercising, and getting tested or screened for diseases. Follow your health care provider's instructions on monitoring your cholesterol and blood pressure. This information is not intended to replace advice given to you by your health care provider. Make sure you discuss any questions you have with your health care provider. Document Revised: 07/29/2020 Document Reviewed: 07/29/2020 Elsevier Patient Education  2024 [employer].

## 2022-09-14 NOTE — Progress Notes (Unsigned)
Subjective:    Stephanie Dudley is a 67 y.o. female who presents for a Welcome to Medicare exam.   Patient Medicare AWV questionnaire was completed by the patient on 09/14/2022; I have confirmed that all information answered by patient is correct and no changes since this date.      Objective:    Today's Vitals   09/14/22 0917 09/14/22 1012  BP: 118/78   Pulse: 69   Temp: 98.5 F (36.9 C)   SpO2: 98%   Weight: 180 lb 6.4 oz (81.8 kg)   Height: 5\' 6"  (1.676 m)   PainSc:  0-No pain  Body mass index is 29.12 kg/m.  Medications Outpatient Encounter Medications as of 09/14/2022  Medication Sig   carvedilol (COREG) 12.5 MG tablet Take 1 tablet (12.5 mg total) by mouth 2 (two) times daily.   colchicine 0.6 MG tablet Take 1 tablet (0.6 mg total) by mouth daily as needed.   escitalopram (LEXAPRO) 10 MG tablet TAKE 1 TABLET BY MOUTH DAILY   ezetimibe (ZETIA) 10 MG tablet Take 1 tablet (10 mg total) by mouth daily.   furosemide (LASIX) 40 MG tablet Take 1 tablet (40 mg total) by mouth daily.   rosuvastatin (CRESTOR) 20 MG tablet TAKE 1 TABLET BY MOUTH DAILY   spironolactone (ALDACTONE) 25 MG tablet TAKE 1 TABLET BY MOUTH DAILY   SYNTHROID 88 MCG tablet TAKE 1 TABLET BY MOUTH DAILY   triamcinolone cream (KENALOG) 0.1 % APPLY TO AFFECTED AREA TWICE DAILY AS NEEDED   valsartan (DIOVAN) 160 MG tablet TAKE 1 TABLET BY MOUTH DAILY   [DISCONTINUED] famotidine (PEPCID) 20 MG tablet TAKE 1 TABLET BY MOUTH DAILY   ALPRAZolam (XANAX) 0.25 MG tablet Take 1 tablet (0.25 mg total) by mouth 2 (two) times daily as needed for anxiety. (Patient not taking: Reported on 09/14/2022)   famotidine (PEPCID) 20 MG tablet Take 1 tablet (20 mg total) by mouth daily.   [DISCONTINUED] metoprolol tartrate (LOPRESSOR) 100 MG tablet Take one (1) tablet by mouth ( 100 mg) 2 hours prior to CT scan.   No facility-administered encounter medications on file as of 09/14/2022.     History: Past Medical History:   Diagnosis Date   Arthritis    Breast mass, right 08/2017   Benign tissue   Complication of anesthesia    hard to wake up with thyroid surgery   Family history of breast cancer    Family history of thyroid cancer    Family history of uterine cancer    Hyperlipidemia    Hypertension    Hypothyroidism    Mitral valve prolapse    a. mild by echo 04/2018.// Echocardiogram 12/21: EF 60-65, no RWMA, mild LVH, GR 1 DD, normal RVSF, mild LAE, myxomatous MV, mild MR, mild MVP, no pericardial effusion    Obesity    Pericardial effusion    Pericarditis    Peripheral edema    ankles, feet   Past Surgical History:  Procedure Laterality Date   APPENDECTOMY  1994   BREAST BIOPSY Left 1999   BREAST LUMPECTOMY WITH RADIOACTIVE SEED LOCALIZATION Right 09/28/2017   Procedure: RIGHT BREAST LUMPECTOMY WITH RADIOACTIVE SEED LOCALIZATION;  Surgeon: Harriette Bouillon, MD;  Location: [facility];  Service: General;  Laterality: Right;   BREAST SURGERY     KNEE ARTHROSCOPY Left    MIDDLE EAR SURGERY Left 2011   THYROIDECTOMY  1974   goiter--total thyroidectomy   TONSILLECTOMY  1972   WRIST FRACTURE SURGERY  Family History  Problem Relation Age of Onset   Breast cancer Sister 11   Thyroid cancer Mother    Hypertension Father    Hyperlipidemia Father    Heart Problems Father    Uterine cancer Paternal Grandmother 16   Social History   Occupational History   Not on file  Tobacco Use   Smoking status: Former    Packs/day: 1.00    Years: 15.00    Additional pack years: 0.00    Total pack years: 15.00    Types: Cigarettes   Smokeless tobacco: Never  Vaping Use   Vaping Use: Never used  Substance and Sexual Activity   Alcohol use: Yes    Comment: wine 2x/wk   Drug use: No   Sexual activity: Yes    Birth control/protection: Post-menopausal    Tobacco Counseling Counseling given: Yes   Immunizations and Health Maintenance Immunization History  Administered Date(s)  Administered   DTaP 03/08/2017   Influenza,inj,Quad PF,6+ Mos 11/24/2018, 12/13/2019   Influenza-Unspecified 01/14/2018, 12/22/2020   PFIZER(Purple Top)SARS-COV-2 Vaccination 06/05/2019, 06/26/2019, 02/03/2020, 08/23/2020   Pfizer Covid-19 Vaccine Bivalent Booster 51yrs & up 01/06/2021   Zoster Recombinat (Shingrix) 08/20/2021, 11/25/2021   Health Maintenance Due  Topic Date Due   PAP SMEAR-Modifier  01/21/2021   COVID-19 Vaccine (6 - 2023-24 season) 11/21/2021   Pneumonia Vaccine 31+ Years old (1 of 1 - PCV) Never done   DEXA SCAN  Never done    Activities of Daily Living    09/14/2022   10:13 AM 09/14/2022    9:27 AM  In your present state of health, do you have any difficulty performing the following activities:  Hearing?  1  Comment  she reports left ear injury  Vision?  0  Difficulty concentrating or making decisions?  0  Walking or climbing stairs?  1  Comment left knee injury -   Dressing or bathing?  0  Doing errands, shopping?  0  Preparing Food and eating ? N   Using the Toilet? N   In the past six months, have you accidently leaked urine? N   Do you have problems with loss of bowel control? N   Managing your Medications? N   Managing your Finances? N   Housekeeping or managing your Housekeeping? N     Physical Exam   Physical Exam Vitals and nursing note reviewed.  Constitutional:      Appearance: Normal appearance.  HENT:     Head: Normocephalic and atraumatic.     Right Ear: Tympanic membrane, ear Dudley and external ear normal.     Left Ear: Tympanic membrane, ear Dudley and external ear normal.  Eyes:     Extraocular Movements: Extraocular movements intact.     Conjunctiva/sclera: Conjunctivae normal.     Pupils: Pupils are equal, round, and reactive to light.  Cardiovascular:     Rate and Rhythm: Normal rate and regular rhythm.     Pulses: Normal pulses.     Heart sounds: Normal heart sounds.  Pulmonary:     Effort: Pulmonary effort is normal.      Breath sounds: Normal breath sounds.  Chest:  Breasts:    Tanner Score is 5.     Right: Normal.     Left: Normal.  Abdominal:     General: Bowel sounds are normal.     Palpations: Abdomen is soft.  Genitourinary:    Comments: Deferred  Musculoskeletal:        General: Normal range of  motion.     Cervical back: Normal range of motion.  Skin:    General: Skin is warm and dry.     Comments: Healed rash, localized hyperpigmented macular lesions on right flank. No overlying erythema or vesicular lesions noted.   Neurological:     General: No focal deficit present.     Mental Status: She is alert and oriented to person, place, and time.  Psychiatric:        Mood and Affect: Mood normal.        Behavior: Behavior normal.       Advanced Directives: Does Patient Have a Medical Advance Directive?: No Would patient like information on creating a medical advance directive?: Yes (MAU/Ambulatory/Procedural Areas - Information given)    Assessment:    This is a routine wellness examination for this patient .   Vision/Hearing screen Hearing Screening   500Hz  1000Hz  2000Hz  4000Hz   Right ear Pass Pass Pass Pass  Left ear Pass Pass Pass Pass   Vision Screening   Right eye Left eye Both eyes  Without correction     With correction 20/20 20/20 20/15     Dietary issues and exercise activities discussed:      Goals      DIET - EAT MORE FRUITS AND VEGETABLES     She also wants to decrease her intake of red meats. Admits she doesn't eat a lot of fruit.      Live a Healthy life     She reports not having any personal goals. She feels she has done everything needed.       Depression Screen    09/14/2022    9:23 AM 03/12/2022    9:59 AM 02/12/2021    8:51 AM 02/05/2020   11:29 AM  PHQ 2/9 Scores  PHQ - 2 Score 0 0 0 0  PHQ- 9 Score 0  0      Fall Risk    09/14/2022    9:23 AM  Fall Risk   Falls in the past year? 0  Number falls in past yr: 0  Injury with Fall? 0  Risk  for fall due to : No Fall Risks  Follow up Falls evaluation completed    MEDICARE RISK AT HOME:    TIMED UP AND GO: Was the test performed? No    Cognitive Function:        09/14/2022    9:26 AM  6CIT Screen  What Year? 0 points  What month? 0 points  What time? 0 points  Count back from 20 0 points  Months in reverse 2 points  Repeat phrase 2 points  Total Score 4 points    Patient Care Team: Dorothyann Peng, MD as PCP - General (Internal Medicine) Meriam Sprague, MD as PCP - Cardiology (Cardiology) Kennon Rounds as Physician Assistant (Cardiology) Obgyn, [facility] Resource Referral / Chronic Care Management: CRR required this visit?  No   CCM needed this visit?  No     Plan:   1. Encounter for Medicare annual wellness exam The annual wellness visit was performed including discussion of advanced directives, assessment of functional status and cognitive function. EKG performed, NSR w/o acute changes. A full exam was also performed. She will rto in one year for AWV with [facility] Advisor.  PATIENT IS ADVISED TO GET 30-45 MINUTES REGULAR EXERCISE NO LESS THAN FOUR TO FIVE DAYS PER WEEK - BOTH WEIGHTBEARING EXERCISES AND AEROBIC ARE RECOMMENDED.  PATIENT IS  ADVISED TO FOLLOW A HEALTHY DIET WITH AT LEAST SIX FRUITS/VEGGIES PER DAY, DECREASE INTAKE OF RED MEAT, AND TO INCREASE FISH INTAKE TO TWO DAYS PER WEEK.  MEATS/FISH SHOULD NOT BE FRIED, BAKED OR BROILED IS PREFERABLE.  IT IS ALSO IMPORTANT TO CUT BACK ON YOUR SUGAR INTAKE. PLEASE AVOID ANYTHING WITH ADDED SUGAR, CORN SYRUP OR OTHER SWEETENERS. IF YOU MUST USE A SWEETENER, YOU CAN TRY STEVIA. IT IS ALSO IMPORTANT TO AVOID ARTIFICIALLY SWEETENERS AND DIET BEVERAGES. LASTLY, I SUGGEST WEARING SPF 50 SUNSCREEN ON EXPOSED PARTS AND ESPECIALLY WHEN IN THE DIRECT SUNLIGHT FOR AN EXTENDED PERIOD OF TIME.  PLEASE AVOID FAST FOOD RESTAURANTS AND INCREASE YOUR WATER INTAKE. - EKG 12-Lead  2. Essential  hypertension Comments: Chronic, well controlled. She will c/w furosemide 40mg , spironolactone 25mg  and valsartan 160mg  daily.  She is encouraged to follow a low sodium diet. She will f/u in six months for re-evaluation.  - POCT Urinalysis Dipstick (81002) - Microalbumin / Creatinine Urine Ratio - CMP14+EGFR - Lipid panel  3. Pure hypercholesterolemia Comments: Chronic, LDL goal < 70.  She will c/w rosuvastatin 20mg  daily and ezetimibe 10mg  daily. Advised to follow a heart healthy lifestyle with increased daily activity. - CMP14+EGFR - Lipid panel  4. Postoperative hypothyroidism Comments: Chronic, she will c/w Synthroid daily. Last TSH March 2024 was therapeutic.  5. Estrogen deficiency Comments: I will request a copy of her bone density from [facility] and Dr. Billy Coast.  6. Overweight with body mass index (BMI) 25.0-29.9 Comments: BMI 29.  She is encouraged to aim for at least 150 minutes of exercise/week.  7. Dermatitis Comments: Appears to be resolving shingles. I will send rx triamcinolone cream for her to apply to affected area twice daily as needed. - triamcinolone cream (KENALOG) 0.1 %; APPLY TO AFFECTED AREA TWICE DAILY AS NEEDED  Dispense: 30 g; Refill: 0  8. Polypharmacy - Vitamin B12    I have personally reviewed and noted the following in the patient's chart:   Medical and social history Use of alcohol, tobacco or illicit drugs  Current medications and supplements Functional ability and status Nutritional status Physical activity Advanced directives List of other physicians Hospitalizations, surgeries, and ER visits in previous 12 months Vitals Screenings to include cognitive, depression, and falls Referrals and appointments  In addition, I have reviewed and discussed with patient certain preventive protocols, quality metrics, and best practice recommendations. A written personalized care plan for preventive services as well as general preventive health  recommendations were provided to patient.     Gwynneth Aliment, MD 09/15/2022  After Visit Summary: (MyChart) Due to this being a telephonic visit, the after visit summary with patients personalized plan was offered to patient via MyChart

## 2022-09-15 LAB — CMP14+EGFR
ALT: 33 IU/L — ABNORMAL HIGH (ref 0–32)
AST: 41 IU/L — ABNORMAL HIGH (ref 0–40)
Albumin: 4.8 g/dL (ref 3.9–4.9)
Alkaline Phosphatase: 68 IU/L (ref 44–121)
BUN/Creatinine Ratio: 10 — ABNORMAL LOW (ref 12–28)
BUN: 8 mg/dL (ref 8–27)
Bilirubin Total: 0.9 mg/dL (ref 0.0–1.2)
CO2: 25 mmol/L (ref 20–29)
Calcium: 9.6 mg/dL (ref 8.7–10.3)
Chloride: 103 mmol/L (ref 96–106)
Creatinine, Ser: 0.78 mg/dL (ref 0.57–1.00)
Globulin, Total: 2.3 g/dL (ref 1.5–4.5)
Glucose: 103 mg/dL — ABNORMAL HIGH (ref 70–99)
Potassium: 4.2 mmol/L (ref 3.5–5.2)
Sodium: 142 mmol/L (ref 134–144)
Total Protein: 7.1 g/dL (ref 6.0–8.5)
eGFR: 84 mL/min/{1.73_m2} (ref >59)

## 2022-09-15 LAB — MICROALBUMIN / CREATININE URINE RATIO
Creatinine, Urine: 26.7 mg/dL
Microalb/Creat Ratio: <11 mg/g creat (ref 0–29)
Microalbumin, Urine: <3 ug/mL

## 2022-09-15 LAB — LIPID PANEL
Chol/HDL Ratio: 3 ratio (ref 0.0–4.4)
Cholesterol, Total: 175 mg/dL (ref 100–199)
HDL: 58 mg/dL (ref >39)
LDL Chol Calc (NIH): 89 mg/dL (ref 0–99)
Triglycerides: 164 mg/dL — ABNORMAL HIGH (ref 0–149)
VLDL Cholesterol Cal: 28 mg/dL (ref 5–40)

## 2022-09-15 LAB — VITAMIN B12: Vitamin B-12: 480 pg/mL (ref 232–1245)

## 2022-09-16 LAB — POCT URINALYSIS DIPSTICK
Bilirubin, UA: NEGATIVE
Glucose, UA: NEGATIVE
Ketones, UA: NEGATIVE
Leukocytes, UA: NEGATIVE
Nitrite, UA: NEGATIVE
Protein, UA: NEGATIVE
Spec Grav, UA: 1.015 (ref 1.010–1.025)
Urobilinogen, UA: 0.2 E.U./dL
pH, UA: 5.5 (ref 5.0–8.0)

## 2022-10-06 ENCOUNTER — Other Ambulatory Visit: Payer: Self-pay | Admitting: Internal Medicine

## 2022-10-06 DIAGNOSIS — Z1231 Encounter for screening mammogram for malignant neoplasm of breast: Secondary | ICD-10-CM

## 2022-10-08 ENCOUNTER — Encounter: Payer: Self-pay | Admitting: Internal Medicine

## 2022-10-08 ENCOUNTER — Telehealth (INDEPENDENT_AMBULATORY_CARE_PROVIDER_SITE_OTHER): Payer: Medicare Other | Admitting: Internal Medicine

## 2022-10-08 DIAGNOSIS — I10 Essential (primary) hypertension: Secondary | ICD-10-CM | POA: Diagnosis not present

## 2022-10-08 DIAGNOSIS — U071 COVID-19: Secondary | ICD-10-CM | POA: Diagnosis not present

## 2022-10-08 DIAGNOSIS — E2839 Other primary ovarian failure: Secondary | ICD-10-CM

## 2022-10-08 MED ORDER — MOLNUPIRAVIR EUA 200MG CAPSULE: 4.0000 | ORAL_CAPSULE | Freq: Two times a day (BID) | ORAL | 0 refills | Status: AC

## 2022-10-08 NOTE — Patient Instructions (Signed)

## 2022-10-08 NOTE — Progress Notes (Signed)
Virtual Visit via Video   This visit type was conducted due to national recommendations for restrictions regarding the COVID-19 Pandemic (e.g. social distancing) in an effort to limit this patient's exposure and mitigate transmission in our community.  Due to her co-morbid illnesses, this patient is at least at moderate risk for complications without adequate follow up.  This format is felt to be most appropriate for this patient at this time.  All issues noted in this document were discussed and addressed.  A limited physical exam was performed with this format.    This visit type was conducted due to national recommendations for restrictions regarding the COVID-19 Pandemic (e.g. social distancing) in an effort to limit this patient's exposure and mitigate transmission in our community.  Patients identity confirmed using two different identifiers.  This format is felt to be most appropriate for this patient at this time.  All issues noted in this document were discussed and addressed.  No physical exam was performed (except for noted visual exam findings with Video Visits).    Date:  10/19/2022   ID:  Stephanie Dudley, DOB November 21, 1955, MRN [id number]  Patient Location:  Car  Provider location:   Office    Chief Complaint:  "I have COVID"  History of Present Illness:    Stephanie Dudley is a 67 y.o. female who presents via video conferencing for a telehealth visit today.    The patient does have symptoms concerning for COVID-19 infection (fever, chills, cough, or new shortness of breath).   Patient presents today for Covid. Last night, she developed sinus congestion, headaches and chills.  This morning, she had sinus congestion, nasal congestion, eye pain, headaches and chills. She has not checked her temperature. She is interested in treatment. She reports taking at home covid test today getting a positive result. She experiences: cough, congestion, chills & headache.  She would  like treatment.      Past Medical History:  Diagnosis Date   Arthritis    Breast mass, right 08/2017   Benign tissue   Complication of anesthesia    hard to wake up with thyroid surgery   Family history of breast cancer    Family history of thyroid cancer    Family history of uterine cancer    Hyperlipidemia    Hypertension    Hypothyroidism    Mitral valve prolapse    a. mild by echo 04/2018.// Echocardiogram 12/21: EF 60-65, no RWMA, mild LVH, GR 1 DD, normal RVSF, mild LAE, myxomatous MV, mild MR, mild MVP, no pericardial effusion    Obesity    Pericardial effusion    Pericarditis    Peripheral edema    ankles, feet   Past Surgical History:  Procedure Laterality Date   APPENDECTOMY  1994   BREAST BIOPSY Left 1999   BREAST LUMPECTOMY WITH RADIOACTIVE SEED LOCALIZATION Right 09/28/2017   Procedure: RIGHT BREAST LUMPECTOMY WITH RADIOACTIVE SEED LOCALIZATION;  Surgeon: Harriette Bouillon, MD;  Location: [facility];  Service: General;  Laterality: Right;   BREAST SURGERY     KNEE ARTHROSCOPY Left    MIDDLE EAR SURGERY Left 2011   THYROIDECTOMY  1974   goiter--total thyroidectomy   TONSILLECTOMY  1972   WRIST FRACTURE SURGERY       Current Meds  Medication Sig   [EXPIRED] molnupiravir EUA (LAGEVRIO) 200 mg CAPS capsule Take 4 capsules (800 mg total) by mouth 2 (two) times daily for 5 days.     Allergies:  Sulfa antibiotics   Social History   Tobacco Use   Smoking status: Former    Current packs/day: 1.00    Average packs/day: 1 pack/day for 15.0 years (15.0 ttl pk-yrs)    Types: Cigarettes   Smokeless tobacco: Never  Vaping Use   Vaping status: Never Used  Substance Use Topics   Alcohol use: Yes    Comment: wine 2x/wk   Drug use: No     Family Hx: The patient's family history includes Breast cancer (age of onset: 81) in her sister; Heart Problems in her father; Hyperlipidemia in her father; Hypertension in her father; Thyroid cancer in her  mother; Uterine cancer (age of onset: 68) in her paternal grandmother.  ROS:   Please see the history of present illness.    Review of Systems  Constitutional:  Positive for chills.  HENT:  Positive for congestion.   Respiratory: Negative.    Gastrointestinal: Negative.   Genitourinary: Negative.   Neurological:  Positive for headaches.  Endo/Heme/Allergies: Negative.   Psychiatric/Behavioral: Negative.      All other systems reviewed and are negative.   Labs/Other Tests and Data Reviewed:    Recent Labs: 06/01/2022: Hemoglobin 12.5; Platelets 272; TSH 1.672 09/14/2022: ALT 33; BUN 8; Creatinine, Ser 0.78; Potassium 4.2; Sodium 142   Recent Lipid Panel Lab Results  Component Value Date/Time   CHOL 175 09/14/2022 10:43 AM   TRIG 164 (H) 09/14/2022 10:43 AM   HDL 58 09/14/2022 10:43 AM   CHOLHDL 3.0 09/14/2022 10:43 AM   LDLCALC 89 09/14/2022 10:43 AM    Wt Readings from Last 3 Encounters:  09/14/22 180 lb 6.4 oz (81.8 kg)  07/30/22 184 lb 6.4 oz (83.6 kg)  06/29/22 183 lb (83 kg)     Exam:    Vital Signs:  There were no vitals taken for this visit.    Physical Exam Vitals and nursing note reviewed.  Constitutional:      Appearance: Normal appearance.  HENT:     Head: Normocephalic and atraumatic.  Eyes:     Extraocular Movements: Extraocular movements intact.  Pulmonary:     Effort: Pulmonary effort is normal.  Musculoskeletal:     Cervical back: Normal range of motion.  Neurological:     Mental Status: She is alert and oriented to person, place, and time.  Psychiatric:        Mood and Affect: Affect normal.     ASSESSMENT & PLAN:    COVID Assessment & Plan:  She would like treatment, rx paxlovid sent to her pharmacy. Advised to take full course, possible side effects d/w patient. I will also refer her for home monitoring/temperature monitoring program. She is encouraged to email me daily on Mychart to let me know how she is doing. She is encouraged to  go to ER should she develop worsening SOB. She is advised to mask for the next ten days.  She is also advised to stay well hydrated, move periodically throughout the day and to have a hot beverage daily.  She verbalizes understanding of her treatment plan. All questions were answered to her satisfaction. She understands that she needs to continue to self quarantine until she is afebrile and symptoms have improved.   Orders: -     MyChart Temperature FLOWSHEET; Future  Essential hypertension Assessment & Plan: Chronic, typically controlled. This puts her at risk for complications, she agrees to COVID treatment.    Other orders -     molnupiravir EUA; Take 4  capsules (800 mg total) by mouth 2 (two) times daily for 5 days.  Dispense: 40 capsule; Refill: 0 -     MYCHART COVID-19 HOME MONITORING PROGRAM; Future     COVID-19 Education: The signs and symptoms of COVID-19 were discussed with the patient and how to seek care for testing (follow up with PCP or arrange E-visit).  The importance of social distancing was discussed today.  Patient Risk:   After full review of this patients clinical status, I feel that they are at least moderate risk at this time.  Time:   Today, I have spent 9 minutes/ seconds with the patient with telehealth technology discussing above diagnoses.     Medication Adjustments/Labs and Tests Ordered: Current medicines are reviewed at length with the patient today.  Concerns regarding medicines are outlined above.   Tests Ordered: No orders of the defined types were placed in this encounter.   Medication Changes: Meds ordered this encounter  Medications   molnupiravir EUA (LAGEVRIO) 200 mg CAPS capsule    Sig: Take 4 capsules (800 mg total) by mouth 2 (two) times daily for 5 days.    Dispense:  40 capsule    Refill:  0    Disposition:  Follow up prn  Signed, Gwynneth Aliment, MD

## 2022-10-09 ENCOUNTER — Telehealth: Payer: Self-pay

## 2022-10-09 ENCOUNTER — Other Ambulatory Visit: Payer: Self-pay | Admitting: Internal Medicine

## 2022-10-09 ENCOUNTER — Encounter: Payer: Self-pay | Admitting: Internal Medicine

## 2022-10-09 MED ORDER — NIRMATRELVIR/RITONAVIR (PAXLOVID)TABLET: 3.0000 | ORAL_TABLET | Freq: Two times a day (BID) | ORAL | 0 refills | Status: AC

## 2022-10-09 NOTE — Telephone Encounter (Signed)
Called patient to offer COVID advise for symptoms of cough and vomiting, also to see how patient is doing. No answer, will sent care advise to My Chart for review.

## 2022-10-10 ENCOUNTER — Encounter: Payer: Self-pay | Admitting: Internal Medicine

## 2022-10-14 ENCOUNTER — Encounter (INDEPENDENT_AMBULATORY_CARE_PROVIDER_SITE_OTHER): Payer: Self-pay

## 2022-10-19 ENCOUNTER — Encounter (INDEPENDENT_AMBULATORY_CARE_PROVIDER_SITE_OTHER): Payer: Self-pay

## 2022-10-19 ENCOUNTER — Encounter: Payer: Self-pay | Admitting: Internal Medicine

## 2022-10-19 DIAGNOSIS — E2839 Other primary ovarian failure: Secondary | ICD-10-CM | POA: Insufficient documentation

## 2022-10-19 DIAGNOSIS — U071 COVID-19: Secondary | ICD-10-CM | POA: Insufficient documentation

## 2022-10-19 NOTE — Assessment & Plan Note (Signed)
She would like treatment, rx paxlovid sent to her pharmacy. Advised to take full course, possible side effects d/w patient. I will also refer her for home monitoring/temperature monitoring program. She is encouraged to email me daily on Mychart to let me know how she is doing. She is encouraged to go to ER should she develop worsening SOB. She is advised to mask for the next ten days.  She is also advised to stay well hydrated, move periodically throughout the day and to have a hot beverage daily.  She verbalizes understanding of her treatment plan. All questions were answered to her satisfaction. She understands that she needs to continue to self quarantine until she is afebrile and symptoms have improved.

## 2022-10-19 NOTE — Assessment & Plan Note (Signed)
Chronic, typically controlled. This puts her at risk for complications, she agrees to COVID treatment.

## 2022-10-20 ENCOUNTER — Encounter: Payer: Self-pay | Admitting: Internal Medicine

## 2022-10-30 ENCOUNTER — Ambulatory Visit: Payer: Medicare Other | Attending: Nurse Practitioner

## 2022-10-30 DIAGNOSIS — I341 Nonrheumatic mitral (valve) prolapse: Secondary | ICD-10-CM

## 2022-10-30 DIAGNOSIS — I10 Essential (primary) hypertension: Secondary | ICD-10-CM

## 2022-10-30 DIAGNOSIS — E782 Mixed hyperlipidemia: Secondary | ICD-10-CM

## 2022-10-30 DIAGNOSIS — I340 Nonrheumatic mitral (valve) insufficiency: Secondary | ICD-10-CM

## 2022-10-30 LAB — LIPID PANEL
Chol/HDL Ratio: 2.7 ratio (ref 0.0–4.4)
Cholesterol, Total: 151 mg/dL (ref 100–199)
HDL: 55 mg/dL (ref >39)
LDL Chol Calc (NIH): 71 mg/dL (ref 0–99)
Triglycerides: 147 mg/dL (ref 0–149)
VLDL Cholesterol Cal: 25 mg/dL (ref 5–40)

## 2022-10-30 LAB — ALT: ALT: 28 IU/L (ref 0–32)

## 2022-11-17 ENCOUNTER — Ambulatory Visit
Admission: RE | Admit: 2022-11-17 | Discharge: 2022-11-17 | Disposition: A | Discharge status: Home / Self Care | Payer: Medicare Other | Source: Ambulatory Visit | Attending: Internal Medicine | Admitting: Internal Medicine

## 2022-11-17 DIAGNOSIS — Z1231 Encounter for screening mammogram for malignant neoplasm of breast: Secondary | ICD-10-CM

## 2022-11-19 ENCOUNTER — Other Ambulatory Visit: Payer: Self-pay | Admitting: Internal Medicine

## 2022-11-19 ENCOUNTER — Other Ambulatory Visit: Payer: Self-pay

## 2022-11-19 MED ORDER — CARVEDILOL 12.5 MG PO TABS: 12.5000 mg | ORAL_TABLET | Freq: Two times a day (BID) | ORAL | 2 refills | Status: DC

## 2023-02-17 ENCOUNTER — Other Ambulatory Visit: Payer: Self-pay | Admitting: Internal Medicine

## 2023-03-25 ENCOUNTER — Encounter: Payer: Self-pay | Admitting: Internal Medicine

## 2023-03-25 ENCOUNTER — Ambulatory Visit (INDEPENDENT_AMBULATORY_CARE_PROVIDER_SITE_OTHER): Payer: Medicare Other | Admitting: Internal Medicine

## 2023-03-25 VITALS — BP 132/80 | HR 62 | Temp 98.0°F | Ht 66.0 in | Wt 178.6 lb

## 2023-03-25 DIAGNOSIS — E890 Postprocedural hypothyroidism: Secondary | ICD-10-CM

## 2023-03-25 DIAGNOSIS — E2839 Other primary ovarian failure: Secondary | ICD-10-CM

## 2023-03-25 DIAGNOSIS — I2510 Atherosclerotic heart disease of native coronary artery without angina pectoris: Secondary | ICD-10-CM | POA: Diagnosis not present

## 2023-03-25 DIAGNOSIS — I119 Hypertensive heart disease without heart failure: Secondary | ICD-10-CM

## 2023-03-25 DIAGNOSIS — Z23 Encounter for immunization: Secondary | ICD-10-CM

## 2023-03-25 DIAGNOSIS — Z9109 Other allergy status, other than to drugs and biological substances: Secondary | ICD-10-CM

## 2023-03-25 DIAGNOSIS — I2584 Coronary atherosclerosis due to calcified coronary lesion: Secondary | ICD-10-CM | POA: Diagnosis not present

## 2023-03-25 NOTE — Progress Notes (Signed)
 I,Victoria T Emmitt, CMA,acting as a neurosurgeon for Catheryn LOISE Slocumb, MD.,have documented all relevant documentation on the behalf of Catheryn LOISE Slocumb, MD,as directed by  Catheryn LOISE Slocumb, MD while in the presence of Catheryn LOISE Slocumb, MD.  Subjective:  Patient ID: Stephanie Dudley , female    DOB: [birth year] , 68 y.o.   MRN: [id number]  Chief Complaint  Patient presents with   Hypertension   Hyperlipidemia   Hypothyroidism    HPI  Patient presents today for bpc. She reports compliance with medications denies headache, chest pain & sob. She has no specific questions or concerns.      Past Medical History:  Diagnosis Date   Arthritis    Breast mass, right 08/2017   Benign tissue   Complication of anesthesia    hard to wake up with thyroid  surgery   Family history of breast cancer    Family history of thyroid  cancer    Family history of uterine cancer    Hyperlipidemia    Hypertension    Hypothyroidism    Mitral valve prolapse    a. mild by echo 04/2018.// Echocardiogram 12/21: EF 60-65, no RWMA, mild LVH, GR 1 DD, normal RVSF, mild LAE, myxomatous MV, mild MR, mild MVP, no pericardial effusion    Obesity    Pericardial effusion    Pericarditis    Peripheral edema    ankles, feet     Family History  Problem Relation Age of Onset   Breast cancer Sister 103   Thyroid  cancer Mother    Hypertension Father    Hyperlipidemia Father    Heart Problems Father    Uterine cancer Paternal Grandmother 12     Current Outpatient Medications:    carvedilol  (COREG ) 12.5 MG tablet, Take 1 tablet (12.5 mg total) by mouth 2 (two) times daily., Disp: 180 tablet, Rfl: 2   colchicine  0.6 MG tablet, Take 1 tablet (0.6 mg total) by mouth daily as needed., Disp: 60 tablet, Rfl: 3   escitalopram  (LEXAPRO ) 10 MG tablet, TAKE 1 TABLET BY MOUTH DAILY, Disp: 90 tablet, Rfl: 1   famotidine  (PEPCID ) 20 MG tablet, Take 1 tablet (20 mg total) by mouth daily., Disp: 90 tablet, Rfl: 2   furosemide  (LASIX ) 40  MG tablet, TAKE 1 TABLET BY MOUTH DAILY, Disp: 90 tablet, Rfl: 2   rosuvastatin  (CRESTOR ) 20 MG tablet, TAKE 1 TABLET BY MOUTH DAILY, Disp: 90 tablet, Rfl: 1   spironolactone  (ALDACTONE ) 25 MG tablet, TAKE 1 TABLET BY MOUTH DAILY, Disp: 90 tablet, Rfl: 1   SYNTHROID  88 MCG tablet, TAKE 1 TABLET BY MOUTH DAILY, Disp: 90 tablet, Rfl: 1   valsartan  (DIOVAN ) 160 MG tablet, TAKE 1 TABLET BY MOUTH DAILY, Disp: 90 tablet, Rfl: 1   ALPRAZolam  (XANAX ) 0.25 MG tablet, Take 1 tablet (0.25 mg total) by mouth 2 (two) times daily as needed for anxiety. (Patient not taking: Reported on 03/25/2023), Disp: 30 tablet, Rfl: 0   ezetimibe  (ZETIA ) 10 MG tablet, Take 1 tablet (10 mg total) by mouth daily., Disp: 90 tablet, Rfl: 3   triamcinolone  cream (KENALOG ) 0.1 %, APPLY TO AFFECTED AREA TWICE DAILY AS NEEDED, Disp: 30 g, Rfl: 0   Allergies  Allergen Reactions   Sulfa Antibiotics Swelling     Review of Systems  Constitutional: Negative.   HENT:  Positive for rhinorrhea.        Itchy eyes/ears  Respiratory: Negative.    Cardiovascular: Negative.   Gastrointestinal: Negative.   Neurological: Negative.  Psychiatric/Behavioral: Negative.       Today's Vitals   03/25/23 0830  BP: 132/80  Pulse: 62  Temp: 98 F (36.7 C)  SpO2: 98%  Weight: 178 lb 9.6 oz (81 kg)  Height: 5' 6 (1.676 m)   Body mass index is 28.83 kg/m.  Wt Readings from Last 3 Encounters:  03/25/23 178 lb 9.6 oz (81 kg)  09/14/22 180 lb 6.4 oz (81.8 kg)  07/30/22 184 lb 6.4 oz (83.6 kg)     Objective:  Physical Exam Vitals and nursing note reviewed.  Constitutional:      Appearance: Normal appearance.  HENT:     Head: Normocephalic and atraumatic.  Cardiovascular:     Rate and Rhythm: Normal rate and regular rhythm.     Heart sounds: Normal heart sounds.  Pulmonary:     Effort: Pulmonary effort is normal.     Breath sounds: Normal breath sounds.  Skin:    General: Skin is warm.  Neurological:     General: No focal  deficit present.     Mental Status: She is alert.  Psychiatric:        Mood and Affect: Mood normal.        Behavior: Behavior normal.         Assessment And Plan:  Hypertensive heart disease without heart failure Assessment & Plan: Chronic, fair control. Goal BP<120/80. She will continue with carvedilol  12.5mg  twice daily, furosemide  40mg , spironolactone  25mg  and valsartan  160mg  daily. She is encouraged to follow low sodium diet. She will f/u in six months for her physical examination.   Orders: -     CMP14+EGFR  Coronary artery calcification of native artery Assessment & Plan: Chronic, she is currently on carvedilol  and rosuvastatin  20mg  daily. Encouraged to follow heart healthy lifestyle. Cardiology input is appreciated.    Postoperative hypothyroidism Assessment & Plan: Chronic, she is currently taking Synthroid  88mcg daily. I will check thyroid  panel and adjust meds as needed.   Orders: -     TSH + free T4  Estrogen deficiency Assessment & Plan: I will refer her for dexa to be scheduled August/Sept 2025.   Orders: -     DG Bone Density; Future  Environmental allergies Assessment & Plan: She will continue with OTC meds for allergy sx. She will let me know if her sx worsen.    Immunization due -     Pneumococcal conjugate vaccine 20-valent     Return if symptoms worsen or fail to improve.  Patient was given opportunity to ask questions. Patient verbalized understanding of the plan and was able to repeat key elements of the plan. All questions were answered to their satisfaction.    I, Catheryn LOISE Slocumb, MD, have reviewed all documentation for this visit. The documentation on 03/25/23 for the exam, diagnosis, procedures, and orders are all accurate and complete.   IF YOU HAVE BEEN REFERRED TO A SPECIALIST, IT MAY TAKE 1-2 WEEKS TO SCHEDULE/PROCESS THE REFERRAL. IF YOU HAVE NOT HEARD FROM US /SPECIALIST IN TWO WEEKS, PLEASE GIVE US  A CALL AT [phone].    THE PATIENT IS ENCOURAGED TO PRACTICE SOCIAL DISTANCING DUE TO THE COVID-19 PANDEMIC.

## 2023-03-25 NOTE — Assessment & Plan Note (Signed)
 She will continue with OTC meds for allergy sx. She will let me know if her sx worsen.

## 2023-03-25 NOTE — Assessment & Plan Note (Signed)
 Chronic, she is currently taking Synthroid daily.  I will check thyroid panel and adjust meds as needed.

## 2023-03-25 NOTE — Assessment & Plan Note (Signed)
 Chronic, fair control. Goal BP<120/80. She will continue with carvedilol  12.5mg  twice daily, furosemide  40mg , spironolactone  25mg  and valsartan  160mg  daily. She is encouraged to follow low sodium diet. She will f/u in six months for her physical examination.

## 2023-03-25 NOTE — Patient Instructions (Signed)
 Hypertension, Adult Hypertension is another name for high blood pressure. High blood pressure forces your heart to work harder to pump blood. This can cause problems over time. There are two numbers in a blood pressure reading. There is a top number (systolic) over a bottom number (diastolic). It is best to have a blood pressure that is below 120/80. What are the causes? The cause of this condition is not known. Some other conditions can lead to high blood pressure. What increases the risk? Some lifestyle factors can make you more likely to develop high blood pressure: Smoking. Not getting enough exercise or physical activity. Being overweight. Having too much fat, sugar, calories, or salt (sodium) in your diet. Drinking too much alcohol. Other risk factors include: Having any of these conditions: Heart disease. Diabetes. High cholesterol. Kidney disease. Obstructive sleep apnea. Having a family history of high blood pressure and high cholesterol. Age. The risk increases with age. Stress. What are the signs or symptoms? High blood pressure may not cause symptoms. Very high blood pressure (hypertensive crisis) may cause: Headache. Fast or uneven heartbeats (palpitations). Shortness of breath. Nosebleed. Vomiting or feeling like you may vomit (nauseous). Changes in how you see. Very bad chest pain. Feeling dizzy. Seizures. How is this treated? This condition is treated by making healthy lifestyle changes, such as: Eating healthy foods. Exercising more. Drinking less alcohol. Your doctor may prescribe medicine if lifestyle changes do not help enough and if: Your top number is above 130. Your bottom number is above 80. Your personal target blood pressure may vary. Follow these instructions at home: Eating and drinking  If told, follow the DASH eating plan. To follow this plan: Fill one half of your plate at each meal with fruits and vegetables. Fill one fourth of your plate  at each meal with whole grains. Whole grains include whole-wheat pasta, brown rice, and whole-grain bread. Eat or drink low-fat dairy products, such as skim milk or low-fat yogurt. Fill one fourth of your plate at each meal with low-fat (lean) proteins. Low-fat proteins include fish, chicken without skin, eggs, beans, and tofu. Avoid fatty meat, cured and processed meat, or chicken with skin. Avoid pre-made or processed food. Limit the amount of salt in your diet to less than 1,500 mg each day. Do not drink alcohol if: Your doctor tells you not to drink. You are pregnant, may be pregnant, or are planning to become pregnant. If you drink alcohol: Limit how much you have to: 0-1 drink a day for women. 0-2 drinks a day for men. Know how much alcohol is in your drink. In the [place] one drink equals one 12 oz bottle of beer (355 mL), one 5 oz glass of wine (148 mL), or one 1 oz glass of hard liquor (44 mL). Lifestyle  Work with your doctor to stay at a healthy weight or to lose weight. Ask your doctor what the best weight is for you. Get at least 30 minutes of exercise that causes your heart to beat faster (aerobic exercise) most days of the week. This may include walking, swimming, or biking. Get at least 30 minutes of exercise that strengthens your muscles (resistance exercise) at least 3 days a week. This may include lifting weights or doing Pilates. Do not smoke or use any products that contain nicotine or tobacco. If you need help quitting, ask your doctor. Check your blood pressure at home as told by your doctor. Keep all follow-up visits. Medicines Take over-the-counter and prescription medicines  only as told by your doctor. Follow directions carefully. Do not skip doses of blood pressure medicine. The medicine does not work as well if you skip doses. Skipping doses also puts you at risk for problems. Ask your doctor about side effects or reactions to medicines that you should watch  for. Contact a doctor if: You think you are having a reaction to the medicine you are taking. You have headaches that keep coming back. You feel dizzy. You have swelling in your ankles. You have trouble with your vision. Get help right away if: You get a very bad headache. You start to feel mixed up (confused). You feel weak or numb. You feel faint. You have very bad pain in your: Chest. Belly (abdomen). You vomit more than once. You have trouble breathing. These symptoms may be an emergency. Get help right away. Call 911. Do not wait to see if the symptoms will go away. Do not drive yourself to the hospital. Summary Hypertension is another name for high blood pressure. High blood pressure forces your heart to work harder to pump blood. For most people, a normal blood pressure is less than 120/80. Making healthy choices can help lower blood pressure. If your blood pressure does not get lower with healthy choices, you may need to take medicine. This information is not intended to replace advice given to you by your health care provider. Make sure you discuss any questions you have with your health care provider. Document Revised: 12/26/2020 Document Reviewed: 12/26/2020 Elsevier Patient Education  2024 [employer].

## 2023-03-25 NOTE — Assessment & Plan Note (Signed)
 Chronic, she is currently on carvedilol and rosuvastatin 20mg  daily. Encouraged to follow heart healthy lifestyle. Cardiology input is appreciated.

## 2023-03-25 NOTE — Assessment & Plan Note (Signed)
 I will refer her for dexa to be scheduled August/Sept 2025.

## 2023-03-26 LAB — CMP14+EGFR
ALT: 33 [IU]/L — ABNORMAL HIGH (ref 0–32)
AST: 37 [IU]/L (ref 0–40)
Albumin: 4.4 g/dL (ref 3.9–4.9)
Alkaline Phosphatase: 69 [IU]/L (ref 44–121)
BUN/Creatinine Ratio: 11 — ABNORMAL LOW (ref 12–28)
BUN: 9 mg/dL (ref 8–27)
Bilirubin Total: 0.7 mg/dL (ref 0.0–1.2)
CO2: 23 mmol/L (ref 20–29)
Calcium: 9.3 mg/dL (ref 8.7–10.3)
Chloride: 105 mmol/L (ref 96–106)
Creatinine, Ser: 0.79 mg/dL (ref 0.57–1.00)
Globulin, Total: 2 g/dL (ref 1.5–4.5)
Glucose: 92 mg/dL (ref 70–99)
Potassium: 4.1 mmol/L (ref 3.5–5.2)
Sodium: 141 mmol/L (ref 134–144)
Total Protein: 6.4 g/dL (ref 6.0–8.5)
eGFR: 83 mL/min/{1.73_m2} (ref >59)

## 2023-03-26 LAB — TSH+FREE T4
Free T4: 1.48 ng/dL (ref 0.82–1.77)
TSH: 1.76 u[IU]/mL (ref 0.450–4.500)

## 2023-06-21 ENCOUNTER — Other Ambulatory Visit: Payer: Self-pay

## 2023-06-21 ENCOUNTER — Ambulatory Visit: Attending: Orthopaedic Surgery | Admitting: Physical Therapy

## 2023-06-21 ENCOUNTER — Encounter: Payer: Self-pay | Admitting: Physical Therapy

## 2023-06-21 DIAGNOSIS — R262 Difficulty in walking, not elsewhere classified: Secondary | ICD-10-CM | POA: Insufficient documentation

## 2023-06-21 DIAGNOSIS — M25562 Pain in left knee: Secondary | ICD-10-CM | POA: Diagnosis present

## 2023-06-21 DIAGNOSIS — G8929 Other chronic pain: Secondary | ICD-10-CM | POA: Insufficient documentation

## 2023-06-21 DIAGNOSIS — M6281 Muscle weakness (generalized): Secondary | ICD-10-CM | POA: Diagnosis present
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
## 2023-06-30 ENCOUNTER — Other Ambulatory Visit: Payer: Self-pay | Admitting: Internal Medicine

## 2023-06-30 DIAGNOSIS — Z1231 Encounter for screening mammogram for malignant neoplasm of breast: Secondary | ICD-10-CM

## 2023-07-02 ENCOUNTER — Encounter: Payer: Self-pay | Admitting: Physical Therapy

## 2023-07-02 ENCOUNTER — Ambulatory Visit: Attending: Orthopaedic Surgery | Admitting: Physical Therapy

## 2023-07-02 DIAGNOSIS — M25562 Pain in left knee: Secondary | ICD-10-CM | POA: Insufficient documentation

## 2023-07-02 DIAGNOSIS — R262 Difficulty in walking, not elsewhere classified: Secondary | ICD-10-CM | POA: Insufficient documentation

## 2023-07-02 DIAGNOSIS — G8929 Other chronic pain: Secondary | ICD-10-CM | POA: Insufficient documentation

## 2023-07-02 DIAGNOSIS — M6281 Muscle weakness (generalized): Secondary | ICD-10-CM | POA: Insufficient documentation
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
## 2023-07-06 ENCOUNTER — Ambulatory Visit: Admitting: Physical Therapy

## 2023-07-06 ENCOUNTER — Encounter: Payer: Self-pay | Admitting: Physical Therapy

## 2023-07-06 DIAGNOSIS — M25562 Pain in left knee: Secondary | ICD-10-CM | POA: Diagnosis not present

## 2023-07-06 DIAGNOSIS — G8929 Other chronic pain: Secondary | ICD-10-CM

## 2023-07-06 DIAGNOSIS — M6281 Muscle weakness (generalized): Secondary | ICD-10-CM

## 2023-07-06 DIAGNOSIS — R262 Difficulty in walking, not elsewhere classified: Secondary | ICD-10-CM

## 2023-07-06 NOTE — Therapy (Signed)
 OUTPATIENT PHYSICAL THERAPY LOWER EXTREMITY EVALUATION   Patient Name: Stephanie Dudley MRN: [id number] DOB:[birth year], 69 y.o., female Today's Date: 07/06/2023  END OF SESSION:  PT End of Session - 07/06/23 1156     Visit Number 3    Date for PT Re-Evaluation 08/16/23    PT Start Time 1156    PT Stop Time 1230    PT Time Calculation (min) 34 min    Activity Tolerance Patient tolerated treatment well    Behavior During Therapy [facility] for tasks assessed/performed             Past Medical History:  Diagnosis Date   Arthritis    Breast mass, right 08/2017   Benign tissue   Complication of anesthesia    hard to wake up with thyroid surgery   Family history of breast cancer    Family history of thyroid cancer    Family history of uterine cancer    Hyperlipidemia    Hypertension    Hypothyroidism    Mitral valve prolapse    a. mild by echo 04/2018.// Echocardiogram 12/21: EF 60-65, no RWMA, mild LVH, GR 1 DD, normal RVSF, mild LAE, myxomatous MV, mild MR, mild MVP, no pericardial effusion    Obesity    Pericardial effusion    Pericarditis    Peripheral edema    ankles, feet   Past Surgical History:  Procedure Laterality Date   APPENDECTOMY  1994   BREAST BIOPSY Left 1999   BREAST LUMPECTOMY WITH RADIOACTIVE SEED LOCALIZATION Right 09/28/2017   Procedure: RIGHT BREAST LUMPECTOMY WITH RADIOACTIVE SEED LOCALIZATION;  Surgeon: Sim Dryer, MD;  Location: [facility];  Service: General;  Laterality: Right;   BREAST SURGERY     KNEE ARTHROSCOPY Left    MIDDLE EAR SURGERY Left 2011   THYROIDECTOMY  1974   goiter--total thyroidectomy   TONSILLECTOMY  1972   WRIST FRACTURE SURGERY     Patient Active Problem List   Diagnosis Date Noted   Hypertensive heart disease without heart failure 03/25/2023   Coronary artery calcification of native artery 03/25/2023   Environmental allergies 03/25/2023   COVID 10/19/2022   Estrogen deficiency 10/19/2022   Other  abnormal glucose 02/05/2020   Class 1 obesity due to excess calories with serious comorbidity and body mass index (BMI) of 31.0 to 31.9 in adult 02/05/2020   Pericarditis 03/28/2018   Chest pain 02/02/2018   Recurrent idiopathic pericarditis 02/02/2018   Hyperlipidemia    Essential hypertension    Hypothyroidism    Ankle swelling    Genetic testing 11/08/2017   Family history of breast cancer    Family history of thyroid cancer    Family history of uterine cancer     PCP: Cleave Curling MD   REFERRING PROVIDER: Dayne Even, MD  REFERRING DIAG:  Free Text Diagnosis  Lt knee patellofemoral    THERAPY DIAG:  Chronic pain of left knee  Muscle weakness (generalized)  Difficulty in walking, not elsewhere classified  Rationale for Evaluation and Treatment: Rehabilitation  ONSET DATE: about 10 years ago   SUBJECTIVE:  "Ok I'm pretty good, I did a lot yesterday working in the garden"  SUBJECTIVE STATEMENT:  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 Got an arthroscopic surgery, and then got cortisone shots which lasted maybe a week but then it went back to hurting again. Knee is keeping me from walking every day, its difficult on hard pavement.  Steps can be difficult, going down is easier than going up. Still swells and throbs.   PERTINENT HISTORY: See above  PAIN:  Are you having pain? No  0/10  PRECAUTIONS: None  RED FLAGS: None   WEIGHT BEARING RESTRICTIONS: No  FALLS:  Has patient fallen in last 6 months? 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    PATIENT SURVEYS:    Patient-Specific Activity Scoring Scheme  "0" represents "unable to perform." "10" represents "able to perform at prior level. 0 1 2 3 4 5 6 7 8 9  10 (Date and Score)   Activity Eval     1. Walking on hard surfaces  9     2. Steps   5    3.     4.    5.    Score 7    Total score = sum of the activity scores/number of activities Minimum detectable change (90%CI) for average score = 2 points Minimum detectable change (90%CI) for single activity score = 3 points     COGNITION: Overall cognitive status: Within functional limits for tasks assessed     SENSATION: Not tested    MUSCLE LENGTH:  Quads severe limitation B Hip flexors mild limitation B Hams mod limitation B Piriformis mild limitation B     LOWER EXTREMITY ROM:  Active ROM Right eval Left eval  Hip flexion    Hip extension    Hip abduction    Hip adduction    Hip internal rotation    Hip external rotation    Knee flexion  119* supine heel slide   Knee extension  -3* supine with heel prop/quad set   Ankle dorsiflexion    Ankle plantarflexion    Ankle inversion    Ankle eversion     (Blank rows = not tested)  LOWER EXTREMITY MMT:  MMT Right eval Left eval  Hip flexion 3+ 3+  Hip extension 3- 3-  Hip abduction 4 3-  Hip adduction    Hip internal rotation    Hip external rotation    Knee flexion 3+ 3+  Knee extension 4 4  Ankle dorsiflexion 4+ 4+  Ankle plantarflexion    Ankle inversion    Ankle eversion     (Blank rows = not tested)                                                                                                                                  TREATMENT  DATE:  07/06/23 Nustep L5 Hs curls 25lb 2x10 Leg Ext 10lb 2x10 6 in step ups x10  07/02/23 Nustep L5 Sit to stand x10 QS x10 SLR x10 Resisted knee ext. X10, x10 2# anke  weight Resisted Ham curl Red TB x10 4 in step ups x10 2in lateral step up x10 each 4in step up from airex pad x10 Resisted gait x10 20# Resisted side 20# step x5 each way    06/21/23  Exam, POC, HEP  Bridges + ABD into red TB x10 Sidelying TB red TB x10 Seated L LE SLR x10 (small range) Seated HS stretch 3x30 seconds L  Nustep L5x6 minutes BLEs only      PATIENT EDUCATION:  Education details: exam findings, POC, HEP  Person educated: Patient Education method: Programmer, multimedia, Facilities manager, Verbal cues, and Handouts Education comprehension: verbalized understanding, returned demonstration, and needs further education  HOME EXERCISE PROGRAM: Access Code: [email or web address] URL: [email or web address] Date: 06/21/2023 Prepared by: Nedra Hai  Exercises - Supine Bridge with Resistance Band  - 1 x daily - 7 x weekly - 2 sets - 10 reps - Clamshell with Resistance  - 1 x daily - 7 x weekly - 2 sets - 10 reps - Seated Small Alternating Straight Leg Lifts with Heel Touch  - 1 x daily - 7 x weekly - 2 sets - 10 reps - Seated Hamstring Stretch  - 1 x daily - 7 x weekly - 2 sets - 2-3 reps - 30 seconds  hold  ASSESSMENT:  CLINICAL IMPRESSION: Pt enters ~ 13 minutes late for today's session. All interventions completed well with reports of fatigue. Encouragement needed with step ups when using the LLE.  Cue to fully extend working LE when doing step ups.  Again emphasized the importance of her HEP to increase her strength and endurance of her knee.    OBJECTIVE IMPAIRMENTS: decreased mobility, difficulty walking, decreased strength, increased edema, impaired flexibility, and pain.   ACTIVITY LIMITATIONS: sitting, standing, squatting, stairs, transfers, and locomotion level  PARTICIPATION LIMITATIONS: driving, shopping, community activity, yard work, and church  PERSONAL FACTORS: Age, Behavior pattern, Fitness, Social background, and Time since onset of  injury/illness/exacerbation are also affecting patient's functional outcome.   REHAB POTENTIAL: Good  CLINICAL DECISION MAKING: Stable/uncomplicated  EVALUATION COMPLEXITY: Low   GOALS: Goals reviewed with patient? No  SHORT TERM GOALS: Target date: 07/05/2023   Will be compliant with appropriate progressive HEP  Baseline: Goal status: INITIAL      LONG TERM GOALS: Target date: 08/16/2023    MMT to have improved by one grade all weak groups  Baseline:  Goal status: INITIAL  2.  Edema to have resolved  Baseline:  Goal status: INITIAL  3.  Muscle flexibility impairments to be no more than 25% all tested groups  Baseline:  Goal status: INITIAL  4.  Will be able to walk outside and exercise as desired without increase from resting pain levels  Baseline:  Goal status: INITIAL  5.  Will be able to perform steps as desired without increase of pain  Baseline:  Goal status: INITIAL  6.  PFPS to be at least 9 to show improved subjective function Baseline:  Goal status: INITIAL   PLAN:  PT FREQUENCY: 1x/week  PT DURATION: 8 weeks  PLANNED INTERVENTIONS: 97164- PT Re-evaluation, 97110-Therapeutic exercises, 97530- Therapeutic activity, [id number]- Neuromuscular re-education, 97535- Self Care, 30865- Manual therapy, [id number]- Gait training, [phone]- Orthotic Fit/training, G2952- Electrical stimulation (unattended), [phone]- Ionotophoresis 4mg /ml Dexamethasone, Taping, and Dry  Needling  PLAN FOR NEXT SESSION: strength and flexibility training as tolerated, regular HEP updates as she is coming just 1x/week   Towanda Fret, PTA

## 2023-07-13 ENCOUNTER — Encounter: Payer: Self-pay | Admitting: Physical Therapy

## 2023-07-13 ENCOUNTER — Ambulatory Visit: Admitting: Physical Therapy

## 2023-07-13 DIAGNOSIS — M25562 Pain in left knee: Secondary | ICD-10-CM | POA: Diagnosis not present

## 2023-07-13 DIAGNOSIS — G8929 Other chronic pain: Secondary | ICD-10-CM

## 2023-07-13 DIAGNOSIS — M6281 Muscle weakness (generalized): Secondary | ICD-10-CM

## 2023-07-13 DIAGNOSIS — R262 Difficulty in walking, not elsewhere classified: Secondary | ICD-10-CM

## 2023-07-13 NOTE — Therapy (Signed)
 OUTPATIENT PHYSICAL THERAPY LOWER EXTREMITY TREATMENT    Patient Name: Stephanie Dudley MRN: [id number] DOB:1955-08-05, 68 y.o., female Today's Date: 07/13/2023  END OF SESSION:  PT End of Session - 07/13/23 1156     Visit Number 4    Number of Visits 9    Date for PT Re-Evaluation 08/16/23    Authorization Type MCR/BCBS    Authorization Time Period 06/21/23 to 08/16/23    Progress Note Due on Visit 10    PT Start Time 1150    PT Stop Time 1228    PT Time Calculation (min) 38 min    Activity Tolerance Patient tolerated treatment well    Behavior During Therapy [facility] for tasks assessed/performed              Past Medical History:  Diagnosis Date   Arthritis    Breast mass, right 08/2017   Benign tissue   Complication of anesthesia    hard to wake up with thyroid  surgery   Family history of breast cancer    Family history of thyroid  cancer    Family history of uterine cancer    Hyperlipidemia    Hypertension    Hypothyroidism    Mitral valve prolapse    a. mild by echo 04/2018.// Echocardiogram 12/21: EF 60-65, no RWMA, mild LVH, GR 1 DD, normal RVSF, mild LAE, myxomatous MV, mild MR, mild MVP, no pericardial effusion    Obesity    Pericardial effusion    Pericarditis    Peripheral edema    ankles, feet   Past Surgical History:  Procedure Laterality Date   APPENDECTOMY  1994   BREAST BIOPSY Left 1999   BREAST LUMPECTOMY WITH RADIOACTIVE SEED LOCALIZATION Right 09/28/2017   Procedure: RIGHT BREAST LUMPECTOMY WITH RADIOACTIVE SEED LOCALIZATION;  Surgeon: Sim Dryer, MD;  Location: [facility];  Service: General;  Laterality: Right;   BREAST SURGERY     KNEE ARTHROSCOPY Left    MIDDLE EAR SURGERY Left 2011   THYROIDECTOMY  1974   goiter--total thyroidectomy   TONSILLECTOMY  1972   WRIST FRACTURE SURGERY     Patient Active Problem List   Diagnosis Date Noted   Hypertensive heart disease without heart failure 03/25/2023   Coronary artery  calcification of native artery 03/25/2023   Environmental allergies 03/25/2023   COVID 10/19/2022   Estrogen deficiency 10/19/2022   Other abnormal glucose 02/05/2020   Class 1 obesity due to excess calories with serious comorbidity and body mass index (BMI) of 31.0 to 31.9 in adult 02/05/2020   Pericarditis 03/28/2018   Chest pain 02/02/2018   Recurrent idiopathic pericarditis 02/02/2018   Hyperlipidemia    Essential hypertension    Hypothyroidism    Ankle swelling    Genetic testing 11/08/2017   Family history of breast cancer    Family history of thyroid  cancer    Family history of uterine cancer     PCP: Cleave Curling MD   REFERRING PROVIDER: Dayne Even, MD  REFERRING DIAG:  Free Text Diagnosis  Lt knee patellofemoral    THERAPY DIAG:  Chronic pain of left knee  Muscle weakness (generalized)  Difficulty in walking, not elsewhere classified  Rationale for Evaluation and Treatment: Rehabilitation  ONSET DATE: about 10 years ago   SUBJECTIVE:    Nothing new since last time, knee is feeling good today.   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 Got an  arthroscopic surgery, and then got cortisone shots which lasted maybe a week but then it went back to hurting again. Knee is keeping me from walking every day, its difficult on hard pavement. Steps can be difficult, going down is easier than going up. Still swells and throbs.   PERTINENT HISTORY: See above  PAIN:  Are you having pain? No   PRECAUTIONS: None  RED FLAGS: None   WEIGHT BEARING RESTRICTIONS: No  FALLS:  Has patient fallen in last 6 months? 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    PATIENT SURVEYS:    Patient-Specific Activity Scoring Scheme  "0" represents "unable to perform." "10" represents "able to perform at prior level. 0 1 2 3 4 5 6 7 8 9  10 (Date and Score)   Activity Eval     1. Walking on hard surfaces  9     2. Steps   5    3.     4.    5.    Score 7    Total score = sum of the activity scores/number of activities Minimum detectable change (90%CI) for average score = 2 points Minimum detectable change (90%CI) for single activity score = 3 points     COGNITION: Overall cognitive status: Within functional limits for tasks assessed     SENSATION: Not tested    MUSCLE LENGTH:  Quads severe limitation B Hip flexors mild limitation B Hams mod limitation B Piriformis mild limitation B     LOWER EXTREMITY ROM:  Active ROM Right eval Left eval  Hip flexion    Hip extension    Hip abduction    Hip adduction    Hip internal rotation    Hip external rotation    Knee flexion  119* supine heel slide   Knee extension  -3* supine with heel prop/quad set   Ankle dorsiflexion    Ankle plantarflexion    Ankle inversion    Ankle eversion     (Blank rows = not tested)  LOWER EXTREMITY MMT:  MMT Right eval Left eval  Hip flexion 3+ 3+  Hip extension 3- 3-  Hip abduction 4 3-  Hip adduction    Hip internal rotation    Hip external rotation    Knee flexion 3+ 3+  Knee extension 4 4  Ankle dorsiflexion 4+ 4+  Ankle plantarflexion    Ankle inversion    Ankle eversion     (Blank rows = not tested)  TREATMENT DATE:   07/13/23  Nustep L4 x8 minutes BLEs only Forward  step ups 6 inch step x10 L Hip hikes x12 B Lateral step ups 4 inch step x10 L Hip hikes + ABD x12 B Forward step downs x10 B 4 inch step    SLRs 2# L x10  SLRs + ER 2# x10 HS stretches on steps 2x30 seconds B Quad stretches 2x30 seconds B  Calf stretches 2x30 seconds            07/06/23 Nustep L5 Hs curls 25lb 2x10 Leg Ext 10lb 2x10 6 in step ups x10  07/02/23 Nustep L5 Sit to stand x10 QS x10 SLR x10 Resisted knee ext. X10, x10 2# anke weight Resisted Ham curl Red TB x10 4 in step ups x10 2in lateral step up x10 each 4in step up from airex pad x10 Resisted gait x10 20# Resisted side 20# step x5 each way    06/21/23  Exam, POC, HEP  Bridges + ABD into red TB x10 Sidelying TB red TB x10 Seated L LE SLR x10 (small range) Seated HS stretch 3x30 seconds L  Nustep L5x6 minutes BLEs only      PATIENT EDUCATION:  Education details: exam findings, POC, HEP  Person educated: Patient Education method: Programmer, multimedia, Facilities manager, Verbal cues, and Handouts Education comprehension: verbalized understanding, returned demonstration, and needs further education  HOME EXERCISE PROGRAM: Access Code: [email or web address] URL: [email or web address] Date: 06/21/2023 Prepared by: Terrel Ferries  Exercises - Supine Bridge with Resistance Band  - 1 x daily - 7 x weekly - 2 sets - 10 reps - Clamshell with Resistance  - 1 x daily - 7 x weekly - 2 sets - 10 reps - Seated Small Alternating Straight Leg Lifts with Heel Touch  - 1 x daily - 7 x weekly - 2 sets - 10 reps - Seated Hamstring Stretch  - 1 x daily - 7 x weekly - 2 sets - 2-3 reps - 30 seconds  hold  ASSESSMENT:  CLINICAL IMPRESSION:  Pt arrives doing well today, we worked on functional strength as per POC. Able to make steady progressions today, may be ready for HEP update soon.     OBJECTIVE IMPAIRMENTS: decreased mobility, difficulty walking, decreased strength, increased edema, impaired  flexibility, and pain.   ACTIVITY LIMITATIONS: sitting, standing, squatting, stairs, transfers, and locomotion level  PARTICIPATION LIMITATIONS: driving, shopping, community activity, yard work, and church  PERSONAL FACTORS: Age, Behavior pattern, Fitness, Social background, and Time since onset of injury/illness/exacerbation are also affecting patient's functional outcome.   REHAB POTENTIAL: Good  CLINICAL DECISION MAKING: Stable/uncomplicated  EVALUATION COMPLEXITY: Low   GOALS: Goals reviewed with patient? No  SHORT TERM GOALS: Target date: 07/05/2023   Will be compliant with appropriate progressive HEP  Baseline: Goal status: PARTIALLY MET 07/13/23 2-3x/week       LONG TERM GOALS: Target date: 08/16/2023    MMT to have improved by one grade all weak groups  Baseline:  Goal status: INITIAL  2.  Edema to have resolved  Baseline:  Goal status: INITIAL  3.  Muscle flexibility impairments to be no more than 25% all tested groups  Baseline:  Goal status: INITIAL  4.  Will be able to walk outside and exercise as desired without increase from resting pain levels  Baseline:  Goal status: INITIAL  5.  Will be able to perform steps as desired without increase of pain  Baseline:  Goal status: INITIAL  6.  PFPS to  be at least 9 to show improved subjective function Baseline:  Goal status: INITIAL   PLAN:  PT FREQUENCY: 1x/week  PT DURATION: 8 weeks  PLANNED INTERVENTIONS: 97164- PT Re-evaluation, 97110-Therapeutic exercises, 97530- Therapeutic activity, [id number]- Neuromuscular re-education, 97535- Self Care, 62952- Manual therapy, [id number]- Gait training, [phone]- Orthotic Fit/training, [phone]- Electrical stimulation (unattended), [phone]- Ionotophoresis 4mg /ml Dexamethasone , Taping, and Dry Needling  PLAN FOR NEXT SESSION: strength and flexibility training as tolerated, HEP update next visit   Terrel Ferries, PT, DPT 07/13/23 12:27 PM

## 2023-07-20 ENCOUNTER — Encounter: Payer: Self-pay | Admitting: Physical Therapy

## 2023-07-20 ENCOUNTER — Ambulatory Visit: Admitting: Physical Therapy

## 2023-07-20 DIAGNOSIS — M6281 Muscle weakness (generalized): Secondary | ICD-10-CM

## 2023-07-20 DIAGNOSIS — R262 Difficulty in walking, not elsewhere classified: Secondary | ICD-10-CM

## 2023-07-20 DIAGNOSIS — G8929 Other chronic pain: Secondary | ICD-10-CM

## 2023-07-20 DIAGNOSIS — M25562 Pain in left knee: Secondary | ICD-10-CM | POA: Diagnosis not present

## 2023-07-20 NOTE — Therapy (Signed)
 OUTPATIENT PHYSICAL THERAPY LOWER EXTREMITY TREATMENT    Patient Name: Stephanie Dudley MRN: [id number] DOB:[birth year], 68 y.o., female Today's Date: 07/20/2023  END OF SESSION:  PT End of Session - 07/20/23 1219     Visit Number 5    Number of Visits 9    Date for PT Re-Evaluation 08/16/23    Authorization Type MCR/BCBS    Authorization Time Period 06/21/23 to 08/16/23    Progress Note Due on Visit 10    PT Start Time 1150    PT Stop Time 1228    PT Time Calculation (min) 38 min    Activity Tolerance Patient tolerated treatment well    Behavior During Therapy [facility] for tasks assessed/performed               Past Medical History:  Diagnosis Date   Arthritis    Breast mass, right 08/2017   Benign tissue   Complication of anesthesia    hard to wake up with thyroid  surgery   Family history of breast cancer    Family history of thyroid  cancer    Family history of uterine cancer    Hyperlipidemia    Hypertension    Hypothyroidism    Mitral valve prolapse    a. mild by echo 04/2018.// Echocardiogram 12/21: EF 60-65, no RWMA, mild LVH, GR 1 DD, normal RVSF, mild LAE, myxomatous MV, mild MR, mild MVP, no pericardial effusion    Obesity    Pericardial effusion    Pericarditis    Peripheral edema    ankles, feet   Past Surgical History:  Procedure Laterality Date   APPENDECTOMY  1994   BREAST BIOPSY Left 1999   BREAST LUMPECTOMY WITH RADIOACTIVE SEED LOCALIZATION Right 09/28/2017   Procedure: RIGHT BREAST LUMPECTOMY WITH RADIOACTIVE SEED LOCALIZATION;  Surgeon: Sim Dryer, MD;  Location: [facility];  Service: General;  Laterality: Right;   BREAST SURGERY     KNEE ARTHROSCOPY Left    MIDDLE EAR SURGERY Left 2011   THYROIDECTOMY  1974   goiter--total thyroidectomy   TONSILLECTOMY  1972   WRIST FRACTURE SURGERY     Patient Active Problem List   Diagnosis Date Noted   Hypertensive heart disease without heart failure 03/25/2023   Coronary artery  calcification of native artery 03/25/2023   Environmental allergies 03/25/2023   COVID 10/19/2022   Estrogen deficiency 10/19/2022   Other abnormal glucose 02/05/2020   Class 1 obesity due to excess calories with serious comorbidity and body mass index (BMI) of 31.0 to 31.9 in adult 02/05/2020   Pericarditis 03/28/2018   Chest pain 02/02/2018   Recurrent idiopathic pericarditis 02/02/2018   Hyperlipidemia    Essential hypertension    Hypothyroidism    Ankle swelling    Genetic testing 11/08/2017   Family history of breast cancer    Family history of thyroid  cancer    Family history of uterine cancer     PCP: Cleave Curling MD   REFERRING PROVIDER: Dayne Even, MD  REFERRING DIAG:  Free Text Diagnosis  Lt knee patellofemoral    THERAPY DIAG:  Chronic pain of left knee  Muscle weakness (generalized)  Difficulty in walking, not elsewhere classified  Rationale for Evaluation and Treatment: Rehabilitation  ONSET DATE: about 10 years ago   SUBJECTIVE:      SUBJECTIVE STATEMENT:  Doing well, knee is feeling about 80% better. Sitting for about an hour or hour and a half is difficult in church, get really stiff. Kneeling is  still a bit tough, especially the getting up part.      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 Got an arthroscopic surgery, and then got cortisone shots which lasted maybe a week but then it went back to hurting again. Knee is keeping me from walking every day, its difficult on hard pavement. Steps can be difficult, going down is easier than going up. Still swells and throbs.   PERTINENT HISTORY: See above  PAIN:  Are you having pain? No 0/10 now, up to 3/10 at worst past week   PRECAUTIONS: None  RED FLAGS: None   WEIGHT BEARING RESTRICTIONS: No  FALLS:  Has patient fallen in last 6 months? 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    PATIENT SURVEYS:    Patient-Specific Activity Scoring Scheme  "0" represents "unable to perform." "10" represents "able to perform at prior level. 0 1 2 3 4 5 6 7 8 9  10 (Date and Score)   Activity Eval  07/20/23   1. Walking on hard surfaces  9   9  2. Steps   5 8   3.     4.    5.    Score 7 8.5   Total score = sum of the activity scores/number of activities Minimum detectable change (90%CI) for average score = 2 points Minimum detectable change (90%CI) for single activity score = 3 points     COGNITION: Overall cognitive status: Within functional limits for tasks assessed     SENSATION: Not tested    MUSCLE LENGTH:  Quads severe limitation B Hip flexors mild limitation B Hams mod limitation B Piriformis mild limitation B     LOWER EXTREMITY ROM:  Active ROM Right eval Left eval  Hip flexion    Hip extension    Hip abduction    Hip adduction    Hip internal rotation    Hip external rotation    Knee flexion  119* supine heel slide   Knee extension  -3* supine with heel prop/quad set   Ankle dorsiflexion    Ankle plantarflexion    Ankle inversion    Ankle eversion     (Blank rows = not tested)  LOWER EXTREMITY MMT:  MMT Right eval Left eval  Hip flexion 3+ 3+  Hip extension 3- 3-  Hip abduction 4 3-  Hip adduction    Hip internal rotation    Hip external rotation    Knee flexion 3+ 3+  Knee extension 4 4  Ankle dorsiflexion 4+ 4+  Ankle plantarflexion    Ankle inversion    Ankle eversion     (Blank rows = not  tested)  TREATMENT DATE:    07/20/23  Nustep L5x8 minutes BLEs only  Forward step up 8 inch box x10 B Wall squats cues for equal WB and form x10 Lateral step ups 8 inch box x10 B Replicated getting up from floor- from 8 inch box + blue pad 2x5 B easily fatigued  Hip hikes 3# x12 B Hip hikes 3# + ABD x12 B  Standing HS stretch 2x30 seconds B  Standing gastroc stretch 1x30 seconds B    Education- encouraged HEP compliance and educated on reasoning for form adjustments throughout session     07/13/23  Nustep L4 x8 minutes BLEs only Forward step ups 6 inch step x10 L Hip hikes x12 B Lateral step ups 4 inch step x10 L Hip hikes + ABD x12 B Forward step downs x10 B 4 inch step    SLRs 2# L x10  SLRs + ER 2# x10 HS stretches on steps 2x30 seconds B Quad stretches 2x30 seconds B  Calf stretches 2x30 seconds            07/06/23 Nustep L5 Hs curls 25lb 2x10 Leg Ext 10lb 2x10 6 in step ups x10  07/02/23 Nustep L5 Sit to stand x10 QS x10 SLR x10 Resisted knee ext. X10, x10 2# anke weight Resisted Ham curl Red TB x10 4 in step ups x10 2in lateral step up x10 each 4in step up from airex pad x10 Resisted gait x10 20# Resisted side 20# step x5 each way    06/21/23  Exam, POC, HEP  Bridges + ABD into red TB x10 Sidelying TB red TB x10 Seated L LE SLR x10 (small range) Seated HS stretch 3x30 seconds L  Nustep L5x6 minutes BLEs only      PATIENT EDUCATION:  Education details: exam findings, POC, HEP  Person educated: Patient Education method: Programmer, multimedia, Facilities manager, Verbal cues, and Handouts Education comprehension: verbalized understanding, returned demonstration, and needs further education  HOME EXERCISE PROGRAM: Access Code: [email or web address] URL: [email or web address] Date: 06/21/2023 Prepared  by: Terrel Ferries  Exercises - Supine Bridge with Resistance Band  - 1 x daily - 7 x weekly - 2 sets - 10 reps - Clamshell with Resistance  - 1 x daily - 7 x weekly - 2 sets - 10 reps - Seated Small Alternating Straight Leg Lifts with Heel Touch  - 1 x daily - 7 x weekly - 2 sets - 10 reps - Seated Hamstring Stretch  - 1 x daily - 7 x weekly - 2 sets - 2-3 reps - 30 seconds  hold  ASSESSMENT:  CLINICAL IMPRESSION:  Pt arrives today doing very well, she relates that she feels 80% better with main concern being getting up from kneeling. We continued to work on strengthening and I had planned to update her HEP, unfortunately she has not really been compliant with existing program so we deferred updates today. Will very likely be ready for DC by end of original cert.    OBJECTIVE IMPAIRMENTS: decreased mobility, difficulty walking, decreased strength, increased edema, impaired flexibility, and pain.   ACTIVITY LIMITATIONS: sitting, standing, squatting, stairs, transfers, and locomotion level  PARTICIPATION LIMITATIONS: driving, shopping, community activity, yard work, and church  PERSONAL FACTORS: Age, Behavior pattern, Fitness, Social background, and Time since onset of injury/illness/exacerbation are also affecting patient's functional outcome.   REHAB POTENTIAL: Good  CLINICAL DECISION MAKING: Stable/uncomplicated  EVALUATION COMPLEXITY: Low   GOALS: Goals reviewed with patient? No  SHORT TERM GOALS: Target date: 07/05/2023  Will be compliant with appropriate progressive HEP  Baseline: Goal status: PARTIALLY MET 07/13/23 2-3x/week       LONG TERM GOALS: Target date: 08/16/2023    MMT to have improved by one grade all weak groups  Baseline:  Goal status: INITIAL  2.  Edema to have resolved  Baseline:  Goal status: INITIAL  3.  Muscle flexibility impairments to be no more than 25% all tested groups  Baseline:  Goal status: INITIAL  4.  Will be able to walk outside  and exercise as desired without increase from resting pain levels  Baseline:  Goal status: INITIAL  5.  Will be able to perform steps as desired without increase of pain  Baseline:  Goal status: INITIAL  6.  PFPS to be at least 9 to show improved subjective function Baseline:  Goal status: INITIAL   PLAN:  PT FREQUENCY: 1x/week  PT DURATION: 8 weeks  PLANNED INTERVENTIONS: 97164- PT Re-evaluation, 97110-Therapeutic exercises, 97530- Therapeutic activity, [id number]- Neuromuscular re-education, 97535- Self Care, 16109- Manual therapy, [id number]- Gait training, [phone]- Orthotic Fit/training, [phone]- Electrical stimulation (unattended), [phone]- Ionotophoresis 4mg /ml Dexamethasone , Taping, and Dry Needling  PLAN FOR NEXT SESSION: strength and flexibility training as tolerated, keep working on ground to stand/kneeling, update MMT   Terrel Ferries, PT, DPT 07/20/23 12:30 PM

## 2023-07-23 ENCOUNTER — Other Ambulatory Visit: Payer: Self-pay | Admitting: Nurse Practitioner

## 2023-07-27 ENCOUNTER — Ambulatory Visit: Attending: Orthopaedic Surgery | Admitting: Physical Therapy

## 2023-07-27 ENCOUNTER — Encounter: Payer: Self-pay | Admitting: Physical Therapy

## 2023-07-27 DIAGNOSIS — M25562 Pain in left knee: Secondary | ICD-10-CM | POA: Diagnosis present

## 2023-07-27 DIAGNOSIS — G8929 Other chronic pain: Secondary | ICD-10-CM | POA: Diagnosis present

## 2023-07-27 DIAGNOSIS — R262 Difficulty in walking, not elsewhere classified: Secondary | ICD-10-CM | POA: Insufficient documentation

## 2023-07-27 DIAGNOSIS — M6281 Muscle weakness (generalized): Secondary | ICD-10-CM | POA: Insufficient documentation

## 2023-07-27 NOTE — Therapy (Signed)
 OUTPATIENT PHYSICAL THERAPY LOWER EXTREMITY TREATMENT    Patient Name: Stephanie Dudley MRN: [id number] DOB:1955-04-18, 68 y.o., female Today's Date: 07/27/2023  END OF SESSION:  PT End of Session - 07/27/23 0847     Visit Number 6    Number of Visits 9    Date for PT Re-Evaluation 08/16/23    Authorization Type MCR/BCBS    Authorization Time Period 06/21/23 to 08/16/23    Progress Note Due on Visit 10    PT Start Time 0845    PT Stop Time 0930    PT Time Calculation (min) 45 min               Past Medical History:  Diagnosis Date   Arthritis    Breast mass, right 08/2017   Benign tissue   Complication of anesthesia    hard to wake up with thyroid  surgery   Family history of breast cancer    Family history of thyroid  cancer    Family history of uterine cancer    Hyperlipidemia    Hypertension    Hypothyroidism    Mitral valve prolapse    a. mild by echo 04/2018.// Echocardiogram 12/21: EF 60-65, no RWMA, mild LVH, GR 1 DD, normal RVSF, mild LAE, myxomatous MV, mild MR, mild MVP, no pericardial effusion    Obesity    Pericardial effusion    Pericarditis    Peripheral edema    ankles, feet   Past Surgical History:  Procedure Laterality Date   APPENDECTOMY  1994   BREAST BIOPSY Left 1999   BREAST LUMPECTOMY WITH RADIOACTIVE SEED LOCALIZATION Right 09/28/2017   Procedure: RIGHT BREAST LUMPECTOMY WITH RADIOACTIVE SEED LOCALIZATION;  Surgeon: Sim Dryer, MD;  Location: [facility];  Service: General;  Laterality: Right;   BREAST SURGERY     KNEE ARTHROSCOPY Left    MIDDLE EAR SURGERY Left 2011   THYROIDECTOMY  1974   goiter--total thyroidectomy   TONSILLECTOMY  1972   WRIST FRACTURE SURGERY     Patient Active Problem List   Diagnosis Date Noted   Hypertensive heart disease without heart failure 03/25/2023   Coronary artery calcification of native artery 03/25/2023   Environmental allergies 03/25/2023   COVID 10/19/2022   Estrogen  deficiency 10/19/2022   Other abnormal glucose 02/05/2020   Class 1 obesity due to excess calories with serious comorbidity and body mass index (BMI) of 31.0 to 31.9 in adult 02/05/2020   Pericarditis 03/28/2018   Chest pain 02/02/2018   Recurrent idiopathic pericarditis 02/02/2018   Hyperlipidemia    Essential hypertension    Hypothyroidism    Ankle swelling    Genetic testing 11/08/2017   Family history of breast cancer    Family history of thyroid  cancer    Family history of uterine cancer     PCP: Cleave Curling MD   REFERRING PROVIDER: Dayne Even, MD  REFERRING DIAG:  Free Text Diagnosis  Lt knee patellofemoral    THERAPY DIAG:  Chronic pain of left knee  Muscle weakness (generalized)  Difficulty in walking, not elsewhere classified  Rationale for Evaluation and Treatment: Rehabilitation  ONSET DATE: about 10 years ago   SUBJECTIVE:      SUBJECTIVE STATEMENT: Feeling tired today. Knee feels ok. It gets stiff when sitting for more than and hour.   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 Got an arthroscopic surgery, and then got cortisone shots which lasted maybe  a week but then it went back to hurting again. Knee is keeping me from walking every day, its difficult on hard pavement. Steps can be difficult, going down is easier than going up. Still swells and throbs.   PERTINENT HISTORY: See above  PAIN:  Are you having pain? No 0/10 now, up to 3/10 at worst past week   PRECAUTIONS: None  RED FLAGS: None   WEIGHT BEARING RESTRICTIONS: No  FALLS:  Has patient fallen in last 6 months? 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    PATIENT SURVEYS:    Patient-Specific Activity Scoring Scheme  "0" represents "unable to perform." "10" represents "able to perform at prior level. 0 1 2 3 4 5 6 7 8 9  10 (Date and Score)   Activity Eval  07/20/23   1. Walking on hard surfaces  9   9  2. Steps   5 8   3.     4.    5.    Score 7 8.5   Total score = sum of the activity scores/number of activities Minimum detectable change (90%CI) for average score = 2 points Minimum detectable change (90%CI) for single activity score = 3 points     COGNITION: Overall cognitive status: Within functional limits for tasks assessed     SENSATION: Not tested    MUSCLE LENGTH:  Quads severe limitation B Hip flexors mild limitation B Hams mod limitation B Piriformis mild limitation B     LOWER EXTREMITY ROM:  Active ROM Right eval Left eval  Hip flexion    Hip extension    Hip abduction    Hip adduction    Hip internal rotation    Hip external rotation    Knee flexion  119* supine heel slide   Knee extension  -3* supine with heel prop/quad set   Ankle dorsiflexion    Ankle plantarflexion    Ankle inversion    Ankle eversion     (Blank rows = not tested)  LOWER EXTREMITY MMT:  MMT Right eval Left eval  Hip flexion 3+ 3+  Hip extension 3- 3-  Hip abduction 4 3-  Hip adduction    Hip internal rotation    Hip external rotation    Knee flexion 3+ 3+  Knee extension 4 4  Ankle dorsiflexion 4+ 4+  Ankle plantarflexion    Ankle inversion    Ankle eversion     (Blank rows = not tested)  TREATMENT DATE:  07/27/23 Bike L2.5 Black bar heel raises 2x10 10# resisted step  up   Forward x10  Lateral x5 each way Squat on dyna disc 2x10 LLE Split squat hold on dyna disc  6in step over step  Backwards x10  Forward x10    07/20/23  Nustep L5x8 minutes BLEs only  Forward step up 8 inch box x10 B Wall squats cues for equal WB and form x10 Lateral step ups 8 inch box x10 B Replicated getting up from floor- from 8 inch box + blue pad 2x5 B easily fatigued  Hip hikes 3# x12 B Hip hikes 3# + ABD x12 B  Standing HS stretch 2x30 seconds B  Standing gastroc stretch 1x30 seconds B    Education- encouraged HEP compliance and educated on reasoning for form adjustments throughout session     07/13/23  Nustep L4 x8 minutes BLEs only Forward step ups 6 inch step x10 L Hip hikes x12 B Lateral step ups 4 inch step x10 L Hip hikes + ABD x12 B Forward step downs x10 B 4 inch step    SLRs 2# L x10  SLRs + ER 2# x10 HS stretches on steps 2x30 seconds B Quad stretches 2x30 seconds B  Calf stretches 2x30 seconds            07/06/23 Nustep L5 Hs curls 25lb 2x10 Leg Ext 10lb 2x10 6 in step ups x10  07/02/23 Nustep L5 Sit to stand x10 QS x10 SLR x10 Resisted knee ext. X10, x10 2# anke weight Resisted Ham curl Red TB x10 4 in step ups x10 2in lateral step up x10 each 4in step up from airex pad x10 Resisted gait x10 20# Resisted side 20# step x5 each way    06/21/23  Exam, POC, HEP  Bridges + ABD into red TB x10 Sidelying TB red TB x10 Seated L LE SLR x10 (small range) Seated HS stretch 3x30 seconds L  Nustep L5x6 minutes BLEs only      PATIENT EDUCATION:  Education details: exam findings, POC, HEP  Person educated: Patient Education method: Programmer, multimedia, Facilities manager, Verbal cues, and Handouts Education comprehension: verbalized understanding, returned demonstration, and needs further education  HOME EXERCISE PROGRAM: Access Code: [email or web address] URL: [email or web address] Date: 06/21/2023 Prepared by:  Terrel Ferries  Exercises - Supine Bridge with Resistance Band  - 1 x daily - 7 x weekly - 2 sets - 10 reps - Clamshell with Resistance  - 1 x daily - 7 x weekly - 2 sets - 10 reps - Seated Small Alternating Straight Leg Lifts with Heel Touch  - 1 x daily - 7 x weekly - 2 sets - 10 reps - Seated Hamstring Stretch  - 1 x daily - 7 x weekly - 2 sets - 2-3 reps - 30 seconds  hold  ASSESSMENT:  CLINICAL IMPRESSION: Pt arrived doing well. Her LLE is still limiting her with stairs. She has visibly improved strength and edema, but still needs to build confidence in her L knee to be able to complete functional activities. She was encouraged to do HEP daily and keep L knee moving to limit stiffness.    OBJECTIVE IMPAIRMENTS: decreased mobility, difficulty walking, decreased strength, increased edema, impaired flexibility, and pain.   ACTIVITY LIMITATIONS: sitting, standing, squatting, stairs, transfers, and locomotion level  PARTICIPATION LIMITATIONS: driving, shopping, community activity, yard work, and church  PERSONAL FACTORS: Age, Behavior pattern, Fitness, Social background, and Time since onset of injury/illness/exacerbation are also  affecting patient's functional outcome.   REHAB POTENTIAL: Good  CLINICAL DECISION MAKING: Stable/uncomplicated  EVALUATION COMPLEXITY: Low   GOALS: Goals reviewed with patient? No  SHORT TERM GOALS: Target date: 07/05/2023   Will be compliant with appropriate progressive HEP  Baseline: Goal status: PARTIALLY MET 07/13/23 2-3x/week       LONG TERM GOALS: Target date: 08/16/2023    MMT to have improved by one grade all weak groups  Baseline:  Goal status: INITIAL  2.  Edema to have resolved  Baseline:  Goal status: INITIAL  3.  Muscle flexibility impairments to be no more than 25% all tested groups  Baseline:  Goal status: INITIAL  4.  Will be able to walk outside and exercise as desired without increase from resting pain levels   Baseline:  Goal status: INITIAL  5.  Will be able to perform steps as desired without increase of pain  Baseline:  Goal status: INITIAL  6.  PFPS to be at least 9 to show improved subjective function Baseline:  Goal status: INITIAL   PLAN:  PT FREQUENCY: 1x/week  PT DURATION: 8 weeks  PLANNED INTERVENTIONS: 97164- PT Re-evaluation, 97110-Therapeutic exercises, 97530- Therapeutic activity, 97112- Neuromuscular re-education, 97535- Self Care, 14782- Manual therapy, [phone]- Gait training, [phone]- Orthotic Fit/training, [phone]- Electrical stimulation (unattended), [phone]- Ionotophoresis 4mg /ml Dexamethasone , Taping, and Dry Needling  PLAN FOR NEXT SESSION: strength and flexibility training as tolerated, keep working on ground to stand/kneeling, update MMT   Laurelyn Ponder, SPTA 07/27/23 8:48 AM

## 2023-07-29 ENCOUNTER — Other Ambulatory Visit: Payer: Self-pay | Admitting: Nurse Practitioner

## 2023-08-03 ENCOUNTER — Ambulatory Visit: Admitting: Physical Therapy

## 2023-08-03 ENCOUNTER — Encounter: Payer: Self-pay | Admitting: Physical Therapy

## 2023-08-03 DIAGNOSIS — M25562 Pain in left knee: Secondary | ICD-10-CM | POA: Diagnosis not present

## 2023-08-03 DIAGNOSIS — M6281 Muscle weakness (generalized): Secondary | ICD-10-CM

## 2023-08-03 DIAGNOSIS — R262 Difficulty in walking, not elsewhere classified: Secondary | ICD-10-CM

## 2023-08-03 DIAGNOSIS — G8929 Other chronic pain: Secondary | ICD-10-CM

## 2023-08-03 NOTE — Therapy (Signed)
 OUTPATIENT PHYSICAL THERAPY LOWER EXTREMITY TREATMENT    Patient Name: Stephanie Dudley MRN: [id number] DOB:07-28-1955, 68 y.o., female Today's Date: 08/03/2023  END OF SESSION:  PT End of Session - 08/03/23 0949     Visit Number 7    Number of Visits 9    Date for PT Re-Evaluation 08/16/23    Authorization Type MCR/BCBS    Authorization Time Period 06/21/23 to 08/16/23    Progress Note Due on Visit 10    PT Start Time 0932    PT Stop Time 1012    PT Time Calculation (min) 40 min    Activity Tolerance Patient tolerated treatment well    Behavior During Therapy [facility] for tasks assessed/performed                Past Medical History:  Diagnosis Date   Arthritis    Breast mass, right 08/2017   Benign tissue   Complication of anesthesia    hard to wake up with thyroid  surgery   Family history of breast cancer    Family history of thyroid  cancer    Family history of uterine cancer    Hyperlipidemia    Hypertension    Hypothyroidism    Mitral valve prolapse    a. mild by echo 04/2018.// Echocardiogram 12/21: EF 60-65, no RWMA, mild LVH, GR 1 DD, normal RVSF, mild LAE, myxomatous MV, mild MR, mild MVP, no pericardial effusion    Obesity    Pericardial effusion    Pericarditis    Peripheral edema    ankles, feet   Past Surgical History:  Procedure Laterality Date   APPENDECTOMY  1994   BREAST BIOPSY Left 1999   BREAST LUMPECTOMY WITH RADIOACTIVE SEED LOCALIZATION Right 09/28/2017   Procedure: RIGHT BREAST LUMPECTOMY WITH RADIOACTIVE SEED LOCALIZATION;  Surgeon: Sim Dryer, MD;  Location: [facility];  Service: General;  Laterality: Right;   BREAST SURGERY     KNEE ARTHROSCOPY Left    MIDDLE EAR SURGERY Left 2011   THYROIDECTOMY  1974   goiter--total thyroidectomy   TONSILLECTOMY  1972   WRIST FRACTURE SURGERY     Patient Active Problem List   Diagnosis Date Noted   Hypertensive heart disease without heart failure 03/25/2023   Coronary artery  calcification of native artery 03/25/2023   Environmental allergies 03/25/2023   COVID 10/19/2022   Estrogen deficiency 10/19/2022   Other abnormal glucose 02/05/2020   Class 1 obesity due to excess calories with serious comorbidity and body mass index (BMI) of 31.0 to 31.9 in adult 02/05/2020   Pericarditis 03/28/2018   Chest pain 02/02/2018   Recurrent idiopathic pericarditis 02/02/2018   Hyperlipidemia    Essential hypertension    Hypothyroidism    Ankle swelling    Genetic testing 11/08/2017   Family history of breast cancer    Family history of thyroid  cancer    Family history of uterine cancer     PCP: Cleave Curling MD   REFERRING PROVIDER: Dayne Even, MD  REFERRING DIAG:  Free Text Diagnosis  Lt knee patellofemoral    THERAPY DIAG:  Chronic pain of left knee  Muscle weakness (generalized)  Difficulty in walking, not elsewhere classified  Rationale for Evaluation and Treatment: Rehabilitation  ONSET DATE: about 10 years ago   SUBJECTIVE:      SUBJECTIVE STATEMENT:  I'm tired I usually don't get up until 10am. Knee is swollen, did a lot of walking and steps at grandson's graduation. Was in the car  going to [place] and was stiff and still  in the car a lot. I'm still coming down the steps sideways, still don't trust my leg on stairs.   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 Got an arthroscopic surgery, and then got cortisone shots which lasted maybe a week but then it went back to hurting again. Knee is keeping me from walking every day, its difficult on hard pavement. Steps can be difficult, going down is easier than going up. Still swells and throbs.   PERTINENT HISTORY: See above  PAIN:  Are you having pain? No 0/10 now, up to 4/10 at worst    PRECAUTIONS: None  RED FLAGS: None   WEIGHT BEARING RESTRICTIONS: No  FALLS:  Has patient fallen in last 6 months? 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    PATIENT SURVEYS:    Patient-Specific Activity Scoring Scheme  "0" represents "unable to perform." "10" represents "able to perform at prior level. 0 1 2 3 4 5 6 7 8 9  10 (Date and Score)   Activity Eval  07/20/23   1. Walking on hard surfaces  9   9  2. Steps   5 8   3.     4.    5.    Score 7 8.5   Total score = sum of the activity scores/number of activities Minimum detectable change (90%CI) for average score = 2 points Minimum detectable change (90%CI) for single activity score = 3 points     COGNITION: Overall cognitive status: Within functional limits for tasks assessed     SENSATION: Not tested    MUSCLE LENGTH:  Quads severe limitation B Hip flexors mild limitation B Hams mod limitation B Piriformis mild limitation B     LOWER EXTREMITY ROM:  Active ROM Right eval Left eval  Hip flexion    Hip extension    Hip abduction    Hip adduction    Hip internal rotation    Hip external rotation    Knee flexion  119* supine heel slide   Knee extension  -3* supine with heel prop/quad set   Ankle dorsiflexion    Ankle plantarflexion    Ankle inversion    Ankle eversion     (Blank rows = not tested)  LOWER EXTREMITY MMT:  MMT Right eval Left eval Right 08/03/23 Left 08/03/23  Hip flexion 3+ 3+ 4+ 4+  Hip extension 3- 3- 4 4  Hip abduction 4 3- 4+ 4  Hip adduction      Hip internal rotation      Hip external rotation      Knee flexion 3+ 3+ 4 4  Knee extension 4 4 4+ 4+  Ankle  dorsiflexion 4+ 4+    Ankle plantarflexion      Ankle inversion      Ankle eversion       (Blank rows = not tested)  TREATMENT DATE:    08/03/23   MMT update Nustep L6x8 minutes all four extremities  STS with 5# in goblet hold with eccentric lower x10 Forward heel taps for eccentric quad 4 inch step x12 B Hip hikes + swing x12 B Seated SLRs with quad set and eccentric lower x10 B   Education on concentric vs eccentric mm action for quads specifically and how the differences in this type of contraction affect muscle function/why ascending stairs and hills is much easier than descending (eccentric quad mm control). Encouraged looking into custom orthotics that may provider her with more support and address her concerns of having good foot support across multiple pairs of shoes     07/27/23 Bike L2.5 Black bar heel raises 2x10 10# resisted step up   Forward x10  Lateral x5 each way Squat on dyna disc 2x10 LLE Split squat hold on dyna disc  6in step over step  Backwards x10  Forward x10    07/20/23  Nustep L5x8 minutes BLEs only  Forward step up 8 inch box x10 B Wall squats cues for equal WB and form x10 Lateral step ups 8 inch box x10 B Replicated getting up from floor- from 8 inch box + blue pad 2x5 B easily fatigued  Hip hikes 3# x12 B Hip hikes 3# + ABD x12 B  Standing HS stretch 2x30 seconds B  Standing gastroc stretch 1x30 seconds B    Education- encouraged HEP compliance and educated on reasoning for form adjustments throughout session     07/13/23  Nustep L4 x8 minutes BLEs only Forward step ups 6 inch step x10 L Hip hikes x12 B Lateral step ups 4 inch step x10 L Hip hikes + ABD x12 B Forward step downs x10 B 4 inch step    SLRs 2# L x10  SLRs + ER 2# x10 HS stretches on steps 2x30 seconds B Quad stretches 2x30  seconds B  Calf stretches 2x30 seconds            07/06/23 Nustep L5 Hs curls 25lb 2x10 Leg Ext 10lb 2x10 6 in step ups x10  07/02/23 Nustep L5 Sit to stand x10 QS x10 SLR x10 Resisted knee ext. X10, x10 2# anke weight Resisted Ham curl Red TB x10 4 in step ups x10 2in lateral step up x10 each 4in step up from airex pad x10 Resisted gait x10 20# Resisted side 20# step x5 each way    06/21/23  Exam, POC, HEP  Bridges + ABD into red TB x10 Sidelying TB red TB x10 Seated L LE SLR x10 (small range) Seated HS stretch 3x30 seconds L  Nustep L5x6 minutes BLEs only      PATIENT EDUCATION:  Education details: exam findings, POC, HEP  Person educated: Patient Education method: Programmer, multimedia, Facilities manager, Verbal cues, and Handouts Education comprehension: verbalized understanding, returned demonstration, and needs further education  HOME EXERCISE PROGRAM: Access Code: [email or web address] URL: [email or web address] Date: 06/21/2023 Prepared by: Terrel Ferries  Exercises - Supine Bridge with Resistance Band  - 1 x daily - 7 x weekly - 2 sets - 10 reps - Clamshell with Resistance  - 1 x daily - 7 x weekly - 2 sets - 10 reps - Seated Small Alternating Straight Leg Lifts with Heel Touch  - 1 x daily - 7 x weekly - 2 sets - 10 reps - Seated Hamstring Stretch  - 1 x daily - 7 x weekly - 2 sets - 2-3 reps -  30 seconds  hold  ASSESSMENT:  CLINICAL IMPRESSION:   Pt arrives today doing well although sounds like she did overdo it this weekend attending some graduation ceremonies and travelling. Happy to see her MMT is improving. Her biggest concern is stairs but talking about it with her, I think a lot of this may be due to nervousness from prior fall on steps- her MMT has improved quite a bit and I don't think she realistically is in danger of knee buckling at this point due to new strength gains. Kept working on strength with some extra emphasis on eccentric  work today as pain/edema allowed. She was really tired today, did not push as hard as our typical.   OBJECTIVE IMPAIRMENTS: decreased mobility, difficulty walking, decreased strength, increased edema, impaired flexibility, and pain.   ACTIVITY LIMITATIONS: sitting, standing, squatting, stairs, transfers, and locomotion level  PARTICIPATION LIMITATIONS: driving, shopping, community activity, yard work, and church  PERSONAL FACTORS: Age, Behavior pattern, Fitness, Social background, and Time since onset of injury/illness/exacerbation are also affecting patient's functional outcome.   REHAB POTENTIAL: Good  CLINICAL DECISION MAKING: Stable/uncomplicated  EVALUATION COMPLEXITY: Low   GOALS: Goals reviewed with patient? No  SHORT TERM GOALS: Target date: 07/05/2023   Will be compliant with appropriate progressive HEP  Baseline: Goal status: PARTIALLY MET 07/13/23 2-3x/week       LONG TERM GOALS: Target date: 08/16/2023    MMT to have improved by one grade all weak groups  Baseline:  Goal status: INITIAL  2.  Edema to have resolved  Baseline:  Goal status: INITIAL  3.  Muscle flexibility impairments to be no more than 25% all tested groups  Baseline:  Goal status: INITIAL  4.  Will be able to walk outside and exercise as desired without increase from resting pain levels  Baseline:  Goal status: INITIAL  5.  Will be able to perform steps as desired without increase of pain  Baseline:  Goal status: INITIAL  6.  PFPS to be at least 9 to show improved subjective function Baseline:  Goal status: INITIAL   PLAN:  PT FREQUENCY: 1x/week  PT DURATION: 8 weeks  PLANNED INTERVENTIONS: 97164- PT Re-evaluation, 97110-Therapeutic exercises, 97530- Therapeutic activity, [id number]- Neuromuscular re-education, 97535- Self Care, [id number]- Manual therapy, [id number]- Gait training, [phone]- Orthotic Fit/training, [id number]- Electrical stimulation (unattended), [phone]- Ionotophoresis 4mg /ml  Dexamethasone , Taping, and Dry Needling  PLAN FOR NEXT SESSION: strength and flexibility training as tolerated, keep working on ground to stand/kneeling, eccentric strength. Likely DC in 2 sessions   Terrel Ferries, PT, DPT 08/03/23 10:12 AM

## 2023-08-10 ENCOUNTER — Encounter: Payer: Self-pay | Admitting: Physical Therapy

## 2023-08-10 ENCOUNTER — Ambulatory Visit: Admitting: Physical Therapy

## 2023-08-10 DIAGNOSIS — M6281 Muscle weakness (generalized): Secondary | ICD-10-CM

## 2023-08-10 DIAGNOSIS — G8929 Other chronic pain: Secondary | ICD-10-CM

## 2023-08-10 DIAGNOSIS — M25562 Pain in left knee: Secondary | ICD-10-CM | POA: Diagnosis not present

## 2023-08-10 DIAGNOSIS — R262 Difficulty in walking, not elsewhere classified: Secondary | ICD-10-CM

## 2023-08-10 NOTE — Therapy (Signed)
 OUTPATIENT PHYSICAL THERAPY LOWER EXTREMITY TREATMENT    Patient Name: Stephanie Dudley MRN: [id number] DOB:11/27/1955, 68 y.o., female Today's Date: 08/10/2023  END OF SESSION:  PT End of Session - 08/10/23 0858     Visit Number 8    Number of Visits 9    Date for PT Re-Evaluation 08/16/23    Authorization Type MCR/BCBS    Authorization Time Period 06/21/23 to 08/16/23    Progress Note Due on Visit 10    PT Start Time 0855   pt late   PT Stop Time 0926    PT Time Calculation (min) 31 min    Activity Tolerance Patient tolerated treatment well    Behavior During Therapy [facility] for tasks assessed/performed                 Past Medical History:  Diagnosis Date   Arthritis    Breast mass, right 08/2017   Benign tissue   Complication of anesthesia    hard to wake up with thyroid  surgery   Family history of breast cancer    Family history of thyroid  cancer    Family history of uterine cancer    Hyperlipidemia    Hypertension    Hypothyroidism    Mitral valve prolapse    a. mild by echo 04/2018.// Echocardiogram 12/21: EF 60-65, no RWMA, mild LVH, GR 1 DD, normal RVSF, mild LAE, myxomatous MV, mild MR, mild MVP, no pericardial effusion    Obesity    Pericardial effusion    Pericarditis    Peripheral edema    ankles, feet   Past Surgical History:  Procedure Laterality Date   APPENDECTOMY  1994   BREAST BIOPSY Left 1999   BREAST LUMPECTOMY WITH RADIOACTIVE SEED LOCALIZATION Right 09/28/2017   Procedure: RIGHT BREAST LUMPECTOMY WITH RADIOACTIVE SEED LOCALIZATION;  Surgeon: Sim Dryer, MD;  Location: [facility];  Service: General;  Laterality: Right;   BREAST SURGERY     KNEE ARTHROSCOPY Left    MIDDLE EAR SURGERY Left 2011   THYROIDECTOMY  1974   goiter--total thyroidectomy   TONSILLECTOMY  1972   WRIST FRACTURE SURGERY     Patient Active Problem List   Diagnosis Date Noted   Hypertensive heart disease without heart failure 03/25/2023    Coronary artery calcification of native artery 03/25/2023   Environmental allergies 03/25/2023   COVID 10/19/2022   Estrogen deficiency 10/19/2022   Other abnormal glucose 02/05/2020   Class 1 obesity due to excess calories with serious comorbidity and body mass index (BMI) of 31.0 to 31.9 in adult 02/05/2020   Pericarditis 03/28/2018   Chest pain 02/02/2018   Recurrent idiopathic pericarditis 02/02/2018   Hyperlipidemia    Essential hypertension    Hypothyroidism    Ankle swelling    Genetic testing 11/08/2017   Family history of breast cancer    Family history of thyroid  cancer    Family history of uterine cancer     PCP: Cleave Curling MD   REFERRING PROVIDER: Dayne Even, MD  REFERRING DIAG:  Free Text Diagnosis  Lt knee patellofemoral    THERAPY DIAG:  Chronic pain of left knee  Muscle weakness (generalized)  Difficulty in walking, not elsewhere classified  Rationale for Evaluation and Treatment: Rehabilitation  ONSET DATE: about 10 years ago   SUBJECTIVE:      SUBJECTIVE STATEMENT:  Nothing new, just trying to keep moving. Rode the train to [place]  and it took about 8 hours, knee wasn't feeling great  after that even though I did get up and move.   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 Got an arthroscopic surgery, and then got cortisone shots which lasted maybe a week but then it went back to hurting again. Knee is keeping me from walking every day, its difficult on hard pavement. Steps can be difficult, going down is easier than going up. Still swells and throbs.   PERTINENT HISTORY: See above  PAIN:  Are you having pain? Yes: NPRS scale: 1/10 Pain location: left knee  Pain description: discomfort, swelling  Aggravating factors: sitting still for too long Relieving factors: movement/ROM    PRECAUTIONS: None  RED FLAGS: None   WEIGHT BEARING RESTRICTIONS: No  FALLS:  Has patient fallen in last 6 months?  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    PATIENT SURVEYS:    Patient-Specific Activity Scoring Scheme  "0" represents "unable to perform." "10" represents "able to perform at prior level. 0 1 2 3 4 5 6 7 8 9  10 (Date and Score)   Activity Eval  07/20/23   1. Walking on hard surfaces  9   9  2. Steps   5 8   3.     4.    5.    Score 7 8.5   Total score = sum of the activity scores/number of activities Minimum detectable change (90%CI) for average score = 2 points Minimum detectable change (90%CI) for single activity score = 3 points     COGNITION: Overall cognitive status: Within functional limits for tasks assessed     SENSATION: Not tested    MUSCLE LENGTH:  Quads severe limitation B Hip flexors mild limitation B Hams mod limitation B Piriformis mild limitation B     LOWER EXTREMITY ROM:  Active ROM Right eval Left eval  Hip flexion    Hip extension    Hip abduction    Hip adduction    Hip internal rotation    Hip external rotation    Knee flexion  119* supine heel slide   Knee extension  -3* supine with heel prop/quad set   Ankle dorsiflexion    Ankle plantarflexion    Ankle inversion    Ankle eversion     (Blank rows = not tested)  LOWER EXTREMITY MMT:  MMT Right eval Left eval Right 08/03/23 Left 08/03/23  Hip flexion 3+ 3+ 4+ 4+  Hip extension 3- 3- 4 4  Hip abduction 4 3- 4+ 4  Hip adduction      Hip internal rotation      Hip external rotation      Knee  flexion 3+ 3+ 4 4  Knee extension 4 4 4+ 4+  Ankle dorsiflexion 4+ 4+    Ankle plantarflexion      Ankle inversion      Ankle eversion       (Blank rows = not tested)  TREATMENT DATE:   08/10/23  Scifit bike L5x8 minutes for w/u, mm endurance  STS 5# goblet hold x12  Brief case squats 10# x10 in front of mat table Knee to 6 inch box with blue foam pad x10 B Hip hike + swing x10 B  SLRs 2# x12 B SLRs + ER 2# x12 B     08/03/23   MMT update Nustep L6x8 minutes all four extremities  STS with 5# in goblet hold with eccentric lower x10 Forward heel taps for eccentric quad 4 inch step x12 B Hip hikes + swing x12 B Seated SLRs with quad set and eccentric lower x10 B   Education on concentric vs eccentric mm action for quads specifically and how the differences in this type of contraction affect muscle function/why ascending stairs and hills is much easier than descending (eccentric quad mm control). Encouraged looking into custom orthotics that may provider her with more support and address her concerns of having good foot support across multiple pairs of shoes     07/27/23 Bike L2.5 Black bar heel raises 2x10 10# resisted step up   Forward x10  Lateral x5 each way Squat on dyna disc 2x10 LLE Split squat hold on dyna disc  6in step over step  Backwards x10  Forward x10        PATIENT EDUCATION:  Education details: exam findings, POC, HEP  Person educated: Patient Education method: Programmer, multimedia, Facilities manager, Verbal cues, and Handouts Education comprehension: verbalized understanding, returned demonstration, and needs further education  HOME EXERCISE PROGRAM: Access Code: [email or web address] URL: [email or web address] Date: 06/21/2023 Prepared by: Terrel Ferries  Exercises - Supine Bridge with Resistance Band  - 1 x daily  - 7 x weekly - 2 sets - 10 reps - Clamshell with Resistance  - 1 x daily - 7 x weekly - 2 sets - 10 reps - Seated Small Alternating Straight Leg Lifts with Heel Touch  - 1 x daily - 7 x weekly - 2 sets - 10 reps - Seated Hamstring Stretch  - 1 x daily - 7 x weekly - 2 sets - 2-3 reps - 30 seconds  hold  ASSESSMENT:  CLINICAL IMPRESSION:  Doing OK, did a lot of sitting on the train and knee didn't love that, encouraged HEP instead of just walking. Continued working on functional strengthening today as per POC. Plan for DC to advanced HEP next visit, she is agreeable.    OBJECTIVE IMPAIRMENTS: decreased mobility, difficulty walking, decreased strength, increased edema, impaired flexibility, and pain.   ACTIVITY LIMITATIONS: sitting, standing, squatting, stairs, transfers, and locomotion level  PARTICIPATION LIMITATIONS: driving, shopping, community activity, yard work, and church  PERSONAL FACTORS: Age, Behavior pattern, Fitness, Social background, and Time since onset of injury/illness/exacerbation are also affecting patient's functional outcome.   REHAB POTENTIAL: Good  CLINICAL DECISION MAKING: Stable/uncomplicated  EVALUATION COMPLEXITY: Low   GOALS: Goals reviewed with patient? No  SHORT TERM GOALS: Target date: 07/05/2023   Will be compliant with appropriate progressive HEP  Baseline: Goal status: PARTIALLY MET 07/13/23 2-3x/week       LONG TERM GOALS: Target date: 08/16/2023    MMT to have improved by one grade all weak groups  Baseline:  Goal status: INITIAL  2.  Edema to have resolved  Baseline:  Goal status: INITIAL  3.  Muscle flexibility impairments to be no more than 25% all tested groups  Baseline:  Goal status: INITIAL  4.  Will be able to walk outside and exercise as  desired without increase from resting pain levels  Baseline:  Goal status: INITIAL  5.  Will be able to perform steps as desired without increase of pain  Baseline:  Goal status:  INITIAL  6.  PFPS to be at least 9 to show improved subjective function Baseline:  Goal status: INITIAL   PLAN:  PT FREQUENCY: 1x/week  PT DURATION: 8 weeks  PLANNED INTERVENTIONS: 97164- PT Re-evaluation, 97110-Therapeutic exercises, 97530- Therapeutic activity, [id number]- Neuromuscular re-education, 97535- Self Care, 16109- Manual therapy, [id number]- Gait training, [phone]- Orthotic Fit/training, U9811- Electrical stimulation (unattended), [phone]- Ionotophoresis 4mg /ml Dexamethasone , Taping, and Dry Needling  PLAN FOR NEXT SESSION: strength and flexibility training as tolerated, keep working on ground to stand/kneeling, eccentric strength. Likely DC next visit   Terrel Ferries, PT, DPT 08/10/23 9:28 AM

## 2023-08-16 NOTE — Progress Notes (Signed)
 Cardiology Office Note:  .   Date:  08/30/2023  ID:  [name], DOB 1955/08/16, MRN [id number] PCP: Cleave Curling, MD  [facility] Providers Cardiologist:  Sonny Dust, MD (Inactive) Cardiology APP:  Gabino Joe, PA-C    History of Present Illness: .   Stephanie Dudley is a 68 y.o. female  with a hx of recurrent pericarditis with associated pericardial effusion (01/2018, 03/2018, 02/2020, HTN, HLD, mild MVP, hypothyroidism, breast cancer s/p right lumpectomy.   Admitted 01/2018 with idiopathic pericarditis and had poor response to NSAIDs.  She was instead treated with steroid taper.  Had abnormal RF but rheumatology did not feel she warranted further evaluation.  Repeat pericarditis 03/2018 with longer steroid taper and colchicine  for 6 months.  Recurrent pericarditis 02/2020.  Patient here for yearly f/u.Overall she says she's doing well. Does have some DOE when she walks a lot but says it's unchanged. 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 Walks the park and mall. No chest pain. Occasional heart skip when she lays down at night not when she's active. A couple weeks ago she was on a train for 8 hrs and her legs were swollen. She had a subway sandwich. The next morning they were normal.   ROS:    Studies Reviewed: Aaron Aas    EKG Interpretation Date/Time:  Monday August 30 2023 10:03:07 EDT Ventricular Rate:  74 PR Interval:  178 QRS Duration:  76 QT Interval:  368 QTC Calculation: 408 R Axis:   22  Text Interpretation: Normal sinus rhythm When compared with ECG of 01-Jun-2022 11:06, PREVIOUS ECG IS PRESENT Confirmed by Theotis Flake [phone]) on 08/30/2023 10:09:06 AM    Prior CV Studies:    Coronary CTA 07/02/22 IMPRESSION: 1. Coronary calcium  score of 6.71. This was 4.8 Percentile for age and sex matched control.   2. Normal coronary origin with right dominance.   3. CAD-RADS 2. Mild non-obstructive CAD (25-49%).  Consider non-atherosclerotic causes of chest pain. Consider preventive therapy and risk factor modification.   Limited Echo 06/29/22 1. Left ventricular ejection fraction, by estimation, is 60 to 65%. The  left ventricle has normal function. The left ventricle has no regional  wall motion abnormalities. Left ventricular diastolic parameters are  consistent with Grade I diastolic  dysfunction (impaired relaxation). The average left ventricular global  longitudinal strain is -21.0 %. The global longitudinal strain is normal.   2. Right ventricular systolic function is normal. The right ventricular  size is normal.   3. The mitral valve is normal in structure. Trivial mitral valve  regurgitation. No evidence of mitral stenosis.   4. The aortic valve is normal in structure. Aortic valve regurgitation is  not visualized. No aortic stenosis is present.   5. The inferior vena cava is normal in size with greater than 50%  respiratory variability, suggesting right atrial pressure of 3 mmHg.      Echo 03/20/2020 LVEF 60 to 65%, no RWMA, mild concentric LVH, G1 DD Normal RV, mildly dilated LA Myxomatous mitral valve with mild regurgitation, mild prolapse of the mitral valve Slight increase in MR and left atrial size compared to TTE 05/04/2018 No evidence of pericardial effusion    Risk Assessment/Calculations:             Physical Exam:   VS:  BP 110/60   Pulse 74   Ht 5\' 6"  (1.676 m)   Wt 182 lb 6.4 oz (82.7 kg)  SpO2 98%   BMI 29.44 kg/m    Wt Readings from Last 3 Encounters:  08/30/23 182 lb 6.4 oz (82.7 kg)  03/25/23 178 lb 9.6 oz (81 kg)  09/14/22 180 lb 6.4 oz (81.8 kg)    GEN: Well nourished, well developed in no acute distress NECK: No JVD; No carotid bruits CARDIAC:  RRR, no murmurs, rubs, gallops RESPIRATORY:  Clear to auscultation without rales, wheezing or rhonchi  ABDOMEN: Soft, non-tender, non-distended EXTREMITIES:  No edema; No deformity   ASSESSMENT AND PLAN:  .    CAD without angina: Coronary CTA 07/02/2022 revealed focal mild (25 to 49%) calcified plaque in the proximal LAD, coronary calcium  score 6.71 (48th percentile age/sex/race controls). She denies chest pain, dyspnea, or other symptoms concerning for angina.  No indication for further ischemic evaluation at this time.  Has been on rosuvastatin  20 mg for years per her report.  She stopped zetia . Due for FLP next month   Hyperlipidemia LDL goal < 70: LDL 71 10/2022.  As noted above she has mild CAD noted on coronary CTA.  Reports she has been taking rosuvastatin  20 mg for years. She stopped her zetia  just because she doesn't want to take so many meds. Due for FLP with PCP next month. May need to restartd    Pericarditis: History of recurrent idiopathic pericarditis 2019-02/2020. Seen 06/29/22 for symptoms consistent with prior episodes of pericarditis.  Limited echo completed that day revealed no evidence of pericardial effusion. .She can decrease colchicine  to as needed from twice daily dosing. She will start with once daily.   MVP/MR: Normal mitral valve with trivial MR, no evidence of stenosis on limited echo 06/29/2022. History of myxomatous mitral valve, mild MR, mild prolapse of mitral valve on echo 02/2020. No significant murmur on exam. We will continue to follow clinically for now.           Dispo:    Signed, Theotis Flake, PA-C

## 2023-08-17 ENCOUNTER — Ambulatory Visit: Admitting: Physical Therapy

## 2023-08-17 ENCOUNTER — Encounter: Payer: Self-pay | Admitting: Physical Therapy

## 2023-08-17 DIAGNOSIS — R262 Difficulty in walking, not elsewhere classified: Secondary | ICD-10-CM

## 2023-08-17 DIAGNOSIS — M25562 Pain in left knee: Secondary | ICD-10-CM | POA: Diagnosis not present

## 2023-08-17 DIAGNOSIS — G8929 Other chronic pain: Secondary | ICD-10-CM

## 2023-08-17 DIAGNOSIS — M6281 Muscle weakness (generalized): Secondary | ICD-10-CM
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
## 2023-08-27 ENCOUNTER — Other Ambulatory Visit: Payer: Self-pay | Admitting: Nurse Practitioner

## 2023-08-30 ENCOUNTER — Encounter: Payer: Self-pay | Admitting: Physician Assistant

## 2023-08-30 ENCOUNTER — Ambulatory Visit: Attending: Physician Assistant | Admitting: Physician Assistant

## 2023-08-30 VITALS — BP 110/60 | HR 74 | Ht 66.0 in | Wt 182.4 lb

## 2023-08-30 DIAGNOSIS — I10 Essential (primary) hypertension: Secondary | ICD-10-CM | POA: Insufficient documentation

## 2023-08-30 DIAGNOSIS — E782 Mixed hyperlipidemia: Secondary | ICD-10-CM | POA: Insufficient documentation

## 2023-08-30 DIAGNOSIS — I341 Nonrheumatic mitral (valve) prolapse: Secondary | ICD-10-CM | POA: Insufficient documentation

## 2023-08-30 DIAGNOSIS — I300 Acute nonspecific idiopathic pericarditis: Secondary | ICD-10-CM | POA: Diagnosis not present

## 2023-08-30 NOTE — Patient Instructions (Signed)
 Medication Instructions:  REDUCE COLCHICINE  THEN STOP.   Lab Work: NONE   Testing/Procedures: NONE  Follow-Up: At [facility], you and your health needs are our priority.  As part of our continuing mission to provide you with exceptional heart care, our providers are all part of one team.  This team includes your primary Cardiologist (physician) and Advanced Practice Providers or APPs (Physician Assistants and Nurse Practitioners) who all work together to provide you with the care you need, when you need it.  Your next appointment:   1 YEAR  Provider:   DR. Addie Holstein, MD

## 2023-09-22 ENCOUNTER — Encounter: Payer: Self-pay | Admitting: Internal Medicine

## 2023-09-22 ENCOUNTER — Ambulatory Visit (INDEPENDENT_AMBULATORY_CARE_PROVIDER_SITE_OTHER): Payer: Self-pay | Admitting: Internal Medicine

## 2023-09-22 VITALS — BP 122/80 | HR 60 | Temp 98.5°F | Ht 66.0 in | Wt 185.2 lb

## 2023-09-22 DIAGNOSIS — I119 Hypertensive heart disease without heart failure: Secondary | ICD-10-CM | POA: Diagnosis not present

## 2023-09-22 DIAGNOSIS — Z0000 Encounter for general adult medical examination without abnormal findings: Secondary | ICD-10-CM

## 2023-09-22 DIAGNOSIS — I2510 Atherosclerotic heart disease of native coronary artery without angina pectoris: Secondary | ICD-10-CM

## 2023-09-22 DIAGNOSIS — W108XXS Fall (on) (from) other stairs and steps, sequela: Secondary | ICD-10-CM

## 2023-09-22 DIAGNOSIS — W108XXA Fall (on) (from) other stairs and steps, initial encounter: Secondary | ICD-10-CM | POA: Insufficient documentation

## 2023-09-22 DIAGNOSIS — E663 Overweight: Secondary | ICD-10-CM

## 2023-09-22 DIAGNOSIS — I2584 Coronary atherosclerosis due to calcified coronary lesion: Secondary | ICD-10-CM

## 2023-09-22 DIAGNOSIS — E890 Postprocedural hypothyroidism: Secondary | ICD-10-CM

## 2023-09-22 DIAGNOSIS — E7800 Pure hypercholesterolemia, unspecified: Secondary | ICD-10-CM

## 2023-09-22 DIAGNOSIS — Z6829 Body mass index (BMI) 29.0-29.9, adult: Secondary | ICD-10-CM

## 2023-09-22 LAB — POCT URINALYSIS DIP (CLINITEK)
Bilirubin, UA: NEGATIVE — AB
Blood, UA: NEGATIVE
Glucose, UA: NEGATIVE mg/dL
Ketones, POC UA: NEGATIVE mg/dL
Nitrite, UA: NEGATIVE
POC PROTEIN,UA: NEGATIVE
Spec Grav, UA: 1.01 (ref 1.010–1.025)
Urobilinogen, UA: 0.2 U/dL
pH, UA: 5.5 (ref 5.0–8.0)

## 2023-09-22 MED ORDER — FUROSEMIDE 40 MG PO TABS: 40.0000 mg | ORAL_TABLET | Freq: Every day | ORAL | 2 refills | Status: DC

## 2023-09-22 MED ORDER — FAMOTIDINE 20 MG PO TABS: 20.0000 mg | ORAL_TABLET | Freq: Every day | ORAL | 2 refills | Status: DC

## 2023-09-22 MED ORDER — VALSARTAN 160 MG PO TABS: 160.0000 mg | ORAL_TABLET | Freq: Every day | ORAL | 1 refills | Status: DC

## 2023-09-22 MED ORDER — ROSUVASTATIN CALCIUM 20 MG PO TABS: 20.0000 mg | ORAL_TABLET | Freq: Every day | ORAL | 1 refills | Status: DC

## 2023-09-22 MED ORDER — SPIRONOLACTONE 25 MG PO TABS: 25.0000 mg | ORAL_TABLET | Freq: Every day | ORAL | 1 refills | Status: DC

## 2023-09-22 MED ORDER — ESCITALOPRAM OXALATE 10 MG PO TABS: 10.0000 mg | ORAL_TABLET | Freq: Every day | ORAL | 1 refills | Status: AC

## 2023-09-22 NOTE — Progress Notes (Signed)
 I,Victoria T Emmitt, CMA,acting as a Neurosurgeon for Catheryn LOISE Slocumb, MD.,have documented all relevant documentation on the behalf of Catheryn LOISE Slocumb, MD,as directed by  Catheryn LOISE Slocumb, MD while in the presence of Catheryn LOISE Slocumb, MD.  Subjective:    Patient ID: Stephanie Dudley , female    DOB: 1956-02-14 , 68 y.o.   MRN: [id number]  Chief Complaint  Patient presents with   Annual Exam    Patient presents today for annual exam. She reports compliance with medications. Denies headache, chest pain & sob. GYN: Dr Gorge   Hypertension   Hypothyroidism   Hyperlipidemia    HPI Discussed the use of AI scribe software for clinical note transcription with the patient, who gave verbal consent to proceed.  History of Present Illness Stephanie Dudley is a 68 year old female who presents for a physical and blood pressure check.  She is currently taking carvedilol  12.5 mg twice a day for hypertension, which is well-controlled on this regimen. She mentions the need for a refill soon. She recently saw a heart specialist, and her EKG was normal. She is not currently on Zetia .  In April, she experienced a fall where she missed the last four steps and injured her knee. She did not visit the emergency room but followed up with her orthopedic doctor, who recommended therapy. She completed two months of therapy from May to June. No recent falls reported.  Her current medications include Lexapro  every night, famotidine  as needed, Lasix  daily, rosuvastatin , and valsartan . She drinks about seven to eight bottles of water a day, attributing this to dry mouth from her dentures.  She engages in physical activity by walking and running after her grandchildren. No new concerns today.   Hypertension This is a chronic problem. The current episode started more than 1 year ago. The problem has been gradually improving since onset. The problem is uncontrolled. Pertinent negatives include no blurred vision. Risk  factors for coronary artery disease include obesity, sedentary lifestyle and post-menopausal state. Past treatments include beta blockers. The current treatment provides moderate improvement. Compliance problems include exercise.      Past Medical History:  Diagnosis Date   Arthritis    Breast mass, right 08/2017   Benign tissue   Complication of anesthesia    hard to wake up with thyroid  surgery   Family history of breast cancer    Family history of thyroid  cancer    Family history of uterine cancer    Hyperlipidemia    Hypertension    Hypothyroidism    Mitral valve prolapse    a. mild by echo 04/2018.// Echocardiogram 12/21: EF 60-65, no RWMA, mild LVH, GR 1 DD, normal RVSF, mild LAE, myxomatous MV, mild MR, mild MVP, no pericardial effusion    Obesity    Pericardial effusion    Pericarditis    Peripheral edema    ankles, feet     Family History  Problem Relation Age of Onset   Breast cancer Sister 39   Thyroid  cancer Mother    Hypertension Father    Hyperlipidemia Father    Heart Problems Father    Uterine cancer Paternal Grandmother 22     Current Outpatient Medications:    carvedilol  (COREG ) 12.5 MG tablet, Take 1 tablet (12.5 mg total) by mouth 2 (two) times daily., Disp: 180 tablet, Rfl: 2   SYNTHROID  88 MCG tablet, TAKE 1 TABLET BY MOUTH DAILY, Disp: 90 tablet, Rfl: 1   escitalopram  (LEXAPRO ) 10  MG tablet, Take 1 tablet (10 mg total) by mouth daily., Disp: 90 tablet, Rfl: 1   famotidine  (PEPCID ) 20 MG tablet, Take 1 tablet (20 mg total) by mouth daily., Disp: 90 tablet, Rfl: 2   furosemide  (LASIX ) 40 MG tablet, Take 1 tablet (40 mg total) by mouth daily., Disp: 90 tablet, Rfl: 2   rosuvastatin  (CRESTOR ) 20 MG tablet, Take 1 tablet (20 mg total) by mouth daily., Disp: 90 tablet, Rfl: 1   spironolactone  (ALDACTONE ) 25 MG tablet, Take 1 tablet (25 mg total) by mouth daily., Disp: 90 tablet, Rfl: 1   valsartan  (DIOVAN ) 160 MG tablet, Take 1 tablet (160 mg total) by  mouth daily., Disp: 90 tablet, Rfl: 1   Allergies  Allergen Reactions   Sulfa Antibiotics Swelling      The patient states she uses post menopausal status for birth control. No LMP recorded. Patient is postmenopausal.. Negative for Dysmenorrhea. Negative for: breast discharge, breast lump(s), breast pain and breast self exam. Associated symptoms include abnormal vaginal bleeding. Pertinent negatives include abnormal bleeding (hematology), anxiety, decreased libido, depression, difficulty falling sleep, dyspareunia, history of infertility, nocturia, sexual dysfunction, sleep disturbances, urinary incontinence, urinary urgency, vaginal discharge and vaginal itching. Diet regular.The patient states her exercise level is    . The patient's tobacco use is:  Social History   Tobacco Use  Smoking Status Former   Current packs/day: 1.00   Average packs/day: 1 pack/day for 15.0 years (15.0 ttl pk-yrs)   Types: Cigarettes  Smokeless Tobacco Never  . She has been exposed to passive smoke. The patient's alcohol use is:  Social History   Substance and Sexual Activity  Alcohol Use Yes   Comment: wine 2x/wk    Review of Systems  Constitutional: Negative.   HENT: Negative.    Eyes: Negative.  Negative for blurred vision.  Respiratory: Negative.    Cardiovascular: Negative.   Gastrointestinal: Negative.   Endocrine: Negative.   Genitourinary: Negative.   Musculoskeletal: Negative.   Skin: Negative.   Allergic/Immunologic: Negative.   Neurological: Negative.   Hematological: Negative.   Psychiatric/Behavioral: Negative.       Today's Vitals   09/22/23 0912  BP: 122/80  Pulse: 60  Temp: 98.5 F (36.9 C)  SpO2: 98%  Weight: 185 lb 3.2 oz (84 kg)  Height: 5' 6 (1.676 m)   Body mass index is 29.89 kg/m.  Wt Readings from Last 3 Encounters:  09/22/23 185 lb 3.2 oz (84 kg)  08/30/23 182 lb 6.4 oz (82.7 kg)  03/25/23 178 lb 9.6 oz (81 kg)     Objective:  Physical Exam Vitals  and nursing note reviewed.  Constitutional:      Appearance: Normal appearance.  HENT:     Head: Normocephalic and atraumatic.     Right Ear: Tympanic membrane, ear canal and external ear normal.     Left Ear: Tympanic membrane, ear canal and external ear normal.     Nose: Nose normal.     Mouth/Throat:     Mouth: Mucous membranes are moist.     Pharynx: Oropharynx is clear.  Eyes:     Extraocular Movements: Extraocular movements intact.     Conjunctiva/sclera: Conjunctivae normal.     Pupils: Pupils are equal, round, and reactive to light.  Cardiovascular:     Rate and Rhythm: Normal rate and regular rhythm.     Pulses: Normal pulses.     Heart sounds: Normal heart sounds.  Pulmonary:     Effort: Pulmonary effort  is normal.     Breath sounds: Normal breath sounds.  Chest:  Breasts:    Tanner Score is 5.     Right: Normal.     Left: Normal.  Abdominal:     General: Abdomen is flat. Bowel sounds are normal.     Palpations: Abdomen is soft.  Genitourinary:    Comments: deferred Musculoskeletal:        General: Normal range of motion.     Cervical back: Normal range of motion and neck supple.  Skin:    General: Skin is warm and dry.  Neurological:     General: No focal deficit present.     Mental Status: She is alert and oriented to person, place, and time.  Psychiatric:        Mood and Affect: Mood normal.        Behavior: Behavior normal.         Assessment And Plan:     Encounter for annual physical exam Assessment & Plan: A full exam was performed.  Importance of monthly self breast exams was discussed with the patient.  She is advised to get 30-45 minutes of regular exercise, no less than four to five days per week. Both weight-bearing and aerobic exercises are recommended.  She is advised to follow a healthy diet with at least six fruits/veggies per day, decrease intake of red meat and other saturated fats and to increase fish intake to twice weekly.  Meats/fish  should not be fried -- baked, boiled or broiled is preferable. It is also important to cut back on your sugar intake.  Be sure to read labels - try to avoid anything with added sugar, high fructose corn syrup or other sweeteners.  If you must use a sweetener, you can try stevia or monkfruit.  It is also important to avoid artificially sweetened foods/beverages and diet drinks. Lastly, wear SPF 50 sunscreen on exposed skin and when in direct sunlight for an extended period of time.  Be sure to avoid fast food restaurants and aim for at least 60 ounces of water daily.      Orders: -     CBC -     CMP14+EGFR -     Lipid panel -     Hemoglobin A1c  Hypertensive heart disease without heart failure Assessment & Plan: Chronic, fair control. Goal BP<120/80. She will continue with carvedilol  12.5mg  twice daily, furosemide  40mg , spironolactone  25mg  and valsartan  160mg  daily. She is encouraged to follow low sodium diet. She will f/u in six months for re-evaluation.    Orders: -     POCT URINALYSIS DIP (CLINITEK) -     Microalbumin / creatinine urine ratio  Coronary artery calcification of native artery Assessment & Plan: Chronic, she is currently on carvedilol  and rosuvastatin  20mg  daily. Encouraged to follow heart healthy lifestyle. Cardiology input is appreciated.    Postoperative hypothyroidism Assessment & Plan: Chronic, she is currently taking Synthroid  88mcg daily. I will check thyroid  panel and adjust meds as needed.   Orders: -     TSH -     T4, free  Pure hypercholesterolemia Assessment & Plan: Chronic, LDL goal is less than 70 due to underlying mild CAD. Cholesterol levels need evaluation; consider Zetia  if LDL not at target. Continue with rosuvastatin  20mg  daily.  - Order cholesterol panel. - Consider restarting Zetia  if LDL is not below target.   Fall down steps, sequela Assessment & Plan: Occurred in April, she has completed PT and  has no new concerns. Fall precautions  discussed with patient.    Overweight with body mass index (BMI) of 29 to 29.9 in adult Assessment & Plan: Her BMI is acceptable for her demographic. Encouraged to aim for at least 150 minutes of exercise per week.    Other orders -     Escitalopram  Oxalate; Take 1 tablet (10 mg total) by mouth daily.  Dispense: 90 tablet; Refill: 1 -     Famotidine ; Take 1 tablet (20 mg total) by mouth daily.  Dispense: 90 tablet; Refill: 2 -     Furosemide ; Take 1 tablet (40 mg total) by mouth daily.  Dispense: 90 tablet; Refill: 2 -     Rosuvastatin  Calcium ; Take 1 tablet (20 mg total) by mouth daily.  Dispense: 90 tablet; Refill: 1 -     Spironolactone ; Take 1 tablet (25 mg total) by mouth daily.  Dispense: 90 tablet; Refill: 1 -     Valsartan ; Take 1 tablet (160 mg total) by mouth daily.  Dispense: 90 tablet; Refill: 1   Return for 1 YEAR HM, 6 MONTH BP & THYROID  F/U.SABRA Patient was given opportunity to ask questions. Patient verbalized understanding of the plan and was able to repeat key elements of the plan. All questions were answered to their satisfaction.   I, Catheryn LOISE Slocumb, MD, have reviewed all documentation for this visit. The documentation on 09/22/23 for the exam, diagnosis, procedures, and orders are all accurate and complete.

## 2023-09-22 NOTE — Assessment & Plan Note (Signed)
 Occurred in April, she has completed PT and has no new concerns. Fall precautions discussed with patient.

## 2023-09-22 NOTE — Patient Instructions (Signed)

## 2023-09-22 NOTE — Assessment & Plan Note (Addendum)
 Chronic, LDL goal is less than 70 due to underlying mild CAD. Cholesterol levels need evaluation; consider Zetia  if LDL not at target. Continue with rosuvastatin  20mg  daily.  - Order cholesterol panel. - Consider restarting Zetia  if LDL is not below target.

## 2023-09-22 NOTE — Assessment & Plan Note (Signed)
 Chronic, she is currently on carvedilol and rosuvastatin 20mg  daily. Encouraged to follow heart healthy lifestyle. Cardiology input is appreciated.

## 2023-09-22 NOTE — Assessment & Plan Note (Signed)

## 2023-09-22 NOTE — Assessment & Plan Note (Signed)
 Chronic, she is currently taking Synthroid daily.  I will check thyroid panel and adjust meds as needed.

## 2023-09-22 NOTE — Assessment & Plan Note (Signed)
 Chronic, fair control. Goal BP<120/80. She will continue with carvedilol  12.5mg  twice daily, furosemide  40mg , spironolactone  25mg  and valsartan  160mg  daily. She is encouraged to follow low sodium diet. She will f/u in six months for re-evaluation.

## 2023-09-22 NOTE — Assessment & Plan Note (Signed)
Her BMI is acceptable for her demographic. Encouraged to aim for at least 150 minutes of exercise per week

## 2023-09-23 LAB — CMP14+EGFR
ALT: 25 IU/L (ref 0–32)
AST: 30 IU/L (ref 0–40)
Albumin: 4.4 g/dL (ref 3.9–4.9)
Alkaline Phosphatase: 72 IU/L (ref 44–121)
BUN/Creatinine Ratio: 11 — ABNORMAL LOW (ref 12–28)
BUN: 8 mg/dL (ref 8–27)
Bilirubin Total: 0.6 mg/dL (ref 0.0–1.2)
CO2: 22 mmol/L (ref 20–29)
Calcium: 9.3 mg/dL (ref 8.7–10.3)
Chloride: 104 mmol/L (ref 96–106)
Creatinine, Ser: 0.7 mg/dL (ref 0.57–1.00)
Globulin, Total: 2.2 g/dL (ref 1.5–4.5)
Glucose: 102 mg/dL — ABNORMAL HIGH (ref 70–99)
Potassium: 4.4 mmol/L (ref 3.5–5.2)
Sodium: 142 mmol/L (ref 134–144)
Total Protein: 6.6 g/dL (ref 6.0–8.5)
eGFR: 95 mL/min/{1.73_m2} (ref >59)

## 2023-09-23 LAB — CBC
Hematocrit: 36.9 % (ref 34.0–46.6)
Hemoglobin: 12.1 g/dL (ref 11.1–15.9)
MCH: 31.3 pg (ref 26.6–33.0)
MCHC: 32.8 g/dL (ref 31.5–35.7)
MCV: 95 fL (ref 79–97)
Platelets: 199 10*3/uL (ref 150–450)
RBC: 3.87 x10E6/uL (ref 3.77–5.28)
RDW: 11.9 % (ref 11.7–15.4)
WBC: 5.3 10*3/uL (ref 3.4–10.8)

## 2023-09-23 LAB — T4, FREE: Free T4: 1.48 ng/dL (ref 0.82–1.77)

## 2023-09-23 LAB — TSH: TSH: 1.43 u[IU]/mL (ref 0.450–4.500)

## 2023-09-23 LAB — LIPID PANEL
Chol/HDL Ratio: 3.8 ratio (ref 0.0–4.4)
Cholesterol, Total: 189 mg/dL (ref 100–199)
HDL: 50 mg/dL (ref >39)
LDL Chol Calc (NIH): 108 mg/dL — ABNORMAL HIGH (ref 0–99)
Triglycerides: 176 mg/dL — ABNORMAL HIGH (ref 0–149)
VLDL Cholesterol Cal: 31 mg/dL (ref 5–40)

## 2023-09-23 LAB — HEMOGLOBIN A1C
Est. average glucose Bld gHb Est-mCnc: 114 mg/dL
Hgb A1c MFr Bld: 5.6 % (ref 4.8–5.6)

## 2023-09-23 LAB — MICROALBUMIN / CREATININE URINE RATIO
Creatinine, Urine: 27.3 mg/dL
Microalb/Creat Ratio: <11 mg/g{creat} (ref 0–29)
Microalbumin, Urine: <3 ug/mL

## 2023-09-25 ENCOUNTER — Ambulatory Visit: Payer: Self-pay | Admitting: Internal Medicine

## 2023-10-02 ENCOUNTER — Other Ambulatory Visit: Payer: Self-pay | Admitting: Internal Medicine

## 2023-10-06 ENCOUNTER — Ambulatory Visit (INDEPENDENT_AMBULATORY_CARE_PROVIDER_SITE_OTHER): Payer: Self-pay

## 2023-10-06 DIAGNOSIS — Z0000 Encounter for general adult medical examination without abnormal findings: Secondary | ICD-10-CM

## 2023-10-06 NOTE — Patient Instructions (Signed)
 Stephanie Dudley , Thank you for taking time out of your busy schedule to complete your Annual Wellness Visit with me. I enjoyed our conversation and look forward to speaking with you again next year. I, as well as your care team,  appreciate your ongoing commitment to your health goals. Please review the following plan we discussed and let me know if I can assist you in the future. Your Game plan/ To Do List    Referrals: If you haven't heard from the office you've been referred to, please reach out to them at the phone provided.  N/a Follow up Visits: Next Medicare AWV with our clinical staff: office will schedule   Have you seen your provider in the last 6 months (3 months if uncontrolled diabetes)? Yes Next Office Visit with your provider: 04/03/2024 at 9:20  Clinician Recommendations:  Aim for 30 minutes of exercise or brisk walking, 6-8 glasses of water, and 5 servings of fruits and vegetables each day.       This is a list of the screening recommended for you and due dates:  Health Maintenance  Topic Date Due   COVID-19 Vaccine (7 - Pfizer risk 2024-25 season) 05/24/2023   Flu Shot  10/22/2023   Medicare Annual Wellness Visit  10/05/2024   Mammogram  11/16/2024   DTaP/Tdap/Td vaccine (2 - Tdap) 03/09/2027   Colon Cancer Screening  07/27/2027   Pneumococcal Vaccine for age over 59  Completed   DEXA scan (bone density measurement)  Completed   Hepatitis C Screening  Completed   Zoster (Shingles) Vaccine  Completed   Hepatitis B Vaccine  Aged Out   HPV Vaccine  Aged Out   Meningitis B Vaccine  Aged Out    Advanced directives: (Copy Requested) Please bring a copy of your health care power of attorney and living will to the office to be added to your chart at your convenience. You can mail to [facility] [address] [place], [place] [address] or email to [email or web address] Advance Care Planning is important because it:  [x]  Makes sure you receive the  medical care that is consistent with your values, goals, and preferences  [x]  It provides guidance to your family and loved ones and reduces their decisional burden about whether or not they are making the right decisions based on your wishes.  Follow the link provided in your after visit summary or read over the paperwork we have mailed to you to help you started getting your Advance Directives in place. If you need assistance in completing these, please reach out to us  so that we can help you!  See attachments for Preventive Care and Fall Prevention Tips.

## 2023-10-06 NOTE — Progress Notes (Signed)
 Subjective:   Stephanie Dudley is a 68 y.o. who presents for a Medicare Wellness preventive visit.  As a reminder, Annual Wellness Visits don't include a physical exam, and some assessments may be limited, especially if this visit is performed virtually. We may recommend an in-person follow-up visit with your provider if needed.  Visit Complete: Virtual I connected with  Stephanie Dudley on 10/06/23 by a audio enabled telemedicine application and verified that I am speaking with the correct person using two identifiers.  Patient Location: Home  Provider Location: Office/Clinic  I discussed the limitations of evaluation and management by telemedicine. The patient expressed understanding and agreed to proceed.  Vital Signs: Because this visit was a virtual/telehealth visit, some criteria may be missing or patient reported. Any vitals not documented were not able to be obtained and vitals that have been documented are patient reported.  VideoError- Librarian, academic were attempted between this provider and patient, however failed, due to patient having technical difficulties OR patient did not have access to video capability.  We continued and completed visit with audio only.   Persons Participating in Visit: Patient.  AWV Questionnaire: Yes: Patient Medicare AWV questionnaire was completed by the patient on 10/04/2023; I have confirmed that all information answered by patient is correct and no changes since this date.  Cardiac Risk Factors include: advanced age (>32men, >50 women);hypertension;dyslipidemia     Objective:    Today's Vitals   10/06/23 0816  PainSc: 5    There is no height or weight on file to calculate BMI.     10/06/2023    8:21 AM 06/21/2023   11:05 AM 09/14/2022   10:20 AM 06/01/2022   11:00 AM 02/22/2021    1:50 PM 03/29/2018    1:02 AM 03/28/2018   11:49 AM  Advanced Directives  Does Patient Have a Medical Advance Directive? No  No No No No No  No   Would patient like information on creating a medical advance directive? No - Patient declined No - Patient declined Yes (MAU/Ambulatory/Procedural Areas - Information given) No - Patient declined No - Patient declined No - Patient declined  No - Patient declined      Data saved with a previous flowsheet row definition    Current Medications (verified) Outpatient Encounter Medications as of 10/06/2023  Medication Sig   carvedilol  (COREG ) 12.5 MG tablet Take 1 tablet (12.5 mg total) by mouth 2 (two) times daily.   escitalopram  (LEXAPRO ) 10 MG tablet Take 1 tablet (10 mg total) by mouth daily.   ezetimibe  (ZETIA ) 10 MG tablet Take 10 mg by mouth daily.   famotidine  (PEPCID ) 20 MG tablet Take 1 tablet (20 mg total) by mouth daily.   furosemide  (LASIX ) 40 MG tablet Take 1 tablet (40 mg total) by mouth daily.   rosuvastatin  (CRESTOR ) 20 MG tablet Take 1 tablet (20 mg total) by mouth daily.   spironolactone  (ALDACTONE ) 25 MG tablet Take 1 tablet (25 mg total) by mouth daily.   SYNTHROID  88 MCG tablet TAKE 1 TABLET BY MOUTH DAILY   valsartan  (DIOVAN ) 160 MG tablet Take 1 tablet (160 mg total) by mouth daily.   No facility-administered encounter medications on file as of 10/06/2023.    Allergies (verified) Sulfa antibiotics   History: Past Medical History:  Diagnosis Date   Allergy    Sulfur   Arthritis    Breast mass, right 08/2017   Benign tissue   Complication of anesthesia  hard to wake up with thyroid  surgery   Family history of breast cancer    Family history of thyroid  cancer    Family history of uterine cancer    GERD (gastroesophageal reflux disease)    Hyperlipidemia    Hypertension    Hypothyroidism    Mitral valve prolapse    a. mild by echo 04/2018.// Echocardiogram 12/21: EF 60-65, no RWMA, mild LVH, GR 1 DD, normal RVSF, mild LAE, myxomatous MV, mild MR, mild MVP, no pericardial effusion    Obesity    Pericardial effusion    Pericarditis     Peripheral edema    ankles, feet   Past Surgical History:  Procedure Laterality Date   APPENDECTOMY  1994   BREAST BIOPSY Left 1999   BREAST LUMPECTOMY WITH RADIOACTIVE SEED LOCALIZATION Right 09/28/2017   Procedure: RIGHT BREAST LUMPECTOMY WITH RADIOACTIVE SEED LOCALIZATION;  Surgeon: Vanderbilt Ned, MD;  Location: [facility];  Service: General;  Laterality: Right;   BREAST SURGERY     FRACTURE SURGERY  Left wrist   KNEE ARTHROSCOPY Left    MIDDLE EAR SURGERY Left 2011   THYROIDECTOMY  1974   goiter--total thyroidectomy   TONSILLECTOMY  1972   TUBAL LIGATION  30 years   WRIST FRACTURE SURGERY     Family History  Problem Relation Age of Onset   Breast cancer Sister 70   Thyroid  cancer Mother    Hypertension Father    Hyperlipidemia Father    Heart Problems Father    Cancer Father    Uterine cancer Paternal Grandmother 31   Social History   Socioeconomic History   Marital status: Married    Spouse name: Not on file   Number of children: Not on file   Years of education: Not on file   Highest education level: Associate degree: occupational, Scientist, product/process development, or vocational program  Occupational History   Not on file  Tobacco Use   Smoking status: Former    Current packs/day: 1.00    Average packs/day: 1 pack/day for 15.0 years (15.0 ttl pk-yrs)    Types: Cigarettes   Smokeless tobacco: Never  Vaping Use   Vaping status: Never Used  Substance and Sexual Activity   Alcohol use: Yes    Alcohol/week: 4.0 standard drinks of alcohol    Types: 4 Glasses of wine per week    Comment: wine 2x/wk   Drug use: No   Sexual activity: Yes    Birth control/protection: Post-menopausal  Other Topics Concern   Not on file  Social History Narrative   Not on file   Social Drivers of Health   Financial Resource Strain: Low Risk  (10/04/2023)   Overall Financial Resource Strain (CARDIA)    Difficulty of Paying Living Expenses: Not very hard  Food Insecurity: No Food  Insecurity (10/04/2023)   Hunger Vital Sign    Worried About Running Out of Food in the Last Year: Never true    Ran Out of Food in the Last Year: Never true  Transportation Needs: No Transportation Needs (10/04/2023)   PRAPARE - Administrator, Civil Service (Medical): No    Lack of Transportation (Non-Medical): No  Physical Activity: Inactive (10/04/2023)   Exercise Vital Sign    Days of Exercise per Week: 0 days    Minutes of Exercise per Session: 0 min  Stress: No Stress Concern Present (10/04/2023)   [employer] of Occupational Health - Occupational Stress Questionnaire    Feeling of Stress:  Not at all  Social Connections: Socially Integrated (10/04/2023)   Social Connection and Isolation Panel    Frequency of Communication with Friends and Family: More than three times a week    Frequency of Social Gatherings with Friends and Family: More than three times a week    Attends Religious Services: More than 4 times per year    Active Member of [employer] or Organizations: Yes    Attends Engineer, structural: More than 4 times per year    Marital Status: Married    Tobacco Counseling Counseling given: Not Answered    Clinical Intake:  Pre-visit preparation completed: Yes  Pain : 0-10 Pain Score: 5  Pain Type: Chronic pain Pain Location: Knee Pain Orientation: Right Pain Descriptors / Indicators: Aching Pain Onset: More than a month ago Pain Frequency: Intermittent     Nutritional Risks: None Diabetes: No  Lab Results  Component Value Date   HGBA1C 5.6 09/22/2023   HGBA1C 5.5 03/12/2022   HGBA1C 6.0 (H) 02/12/2021     How often do you need to have someone help you when you read instructions, pamphlets, or other written materials from your doctor or pharmacy?: 1 - Never     Information entered by :: NAllen LPN   Activities of Daily Living     10/04/2023    1:33 PM  In your present state of health, do you have any difficulty performing the  following activities:  Hearing? 0  Vision? 0  Difficulty concentrating or making decisions? 0  Walking or climbing stairs? 0  Dressing or bathing? 0  Doing errands, shopping? 0  Preparing Food and eating ? N  Using the Toilet? N  In the past six months, have you accidently leaked urine? N  Do you have problems with loss of bowel control? N  Managing your Medications? N  Managing your Finances? N  Housekeeping or managing your Housekeeping? N    Patient Care Team: [name], MD as PCP - General (Internal Medicine) Hobart Powell BRAVO, MD (Inactive) as PCP - Cardiology (Cardiology) Lelon Glendia ONEIDA DEVONNA as Physician Assistant (Cardiology) Obgyn, Anna  I have updated your Care Teams any recent Medical Services you may have received from other providers in the past year.     Assessment:   This is a routine wellness examination for Stephanie Dudley.  Hearing/Vision screen Hearing Screening - Comments:: Denies hearing issues Vision Screening - Comments:: [facility] eye exams[facility]   Goals Addressed             This Visit's Progress    Patient Stated       10/06/2023, start walking again       Depression Screen     10/06/2023    8:23 AM 09/22/2023    9:13 AM 09/14/2022    9:23 AM 03/12/2022    9:59 AM 02/12/2021    8:51 AM 02/05/2020   11:29 AM 11/24/2018    4:22 PM  PHQ 2/9 Scores  PHQ - 2 Score 0 0 0 0 0 0 0  PHQ- 9 Score 3 0 0  0      Fall Risk     10/04/2023    1:33 PM 09/22/2023    9:13 AM 09/14/2022    9:23 AM 11/24/2018    4:22 PM 08/01/2018    2:36 PM  Fall Risk   Falls in the past year? 1 1 0 0  0   Comment knee gave out going down steps  Number falls in past yr: 0 0 0    Injury with Fall? 1 1 0    Risk for fall due to : Medication side effect History of fall(s) No Fall Risks    Follow up Falls prevention discussed;Falls evaluation completed Falls evaluation completed Falls evaluation completed       Data saved with a previous flowsheet  row definition    MEDICARE RISK AT HOME:  Medicare Risk at Home Any stairs in or around the home?: (Patient-Rptd) Yes If so, are there any without handrails?: (Patient-Rptd) No Home free of loose throw rugs in walkways, pet beds, electrical cords, etc?: (Patient-Rptd) No Adequate lighting in your home to reduce risk of falls?: (Patient-Rptd) Yes Life alert?: (Patient-Rptd) No Use of a cane, walker or w/c?: (Patient-Rptd) No Grab bars in the bathroom?: (Patient-Rptd) No Shower chair or bench in shower?: (Patient-Rptd) No Elevated toilet seat or a handicapped toilet?: (Patient-Rptd) No  TIMED UP AND GO:  Was the test performed?  No  Cognitive Function: 6CIT completed        10/06/2023    8:23 AM 09/14/2022    9:26 AM  6CIT Screen  What Year? 0 points 0 points  What month? 0 points 0 points  What time? 0 points 0 points  Count back from 20 0 points 0 points  Months in reverse 2 points 2 points  Repeat phrase 0 points 2 points  Total Score 2 points 4 points    Immunizations Immunization History  Administered Date(s) Administered   DTaP 03/08/2017   Influenza,inj,Quad PF,6+ Mos 11/24/2018, 12/13/2019   Influenza-Unspecified 01/14/2018, 12/22/2020   PFIZER(Purple Top)SARS-COV-2 Vaccination 06/05/2019, 06/26/2019, 02/03/2020, 08/23/2020   PNEUMOCOCCAL CONJUGATE-20 03/25/2023   Pfizer Covid-19 Vaccine Bivalent Booster 11yrs & up 01/06/2021   Pfizer(Comirnaty)Fall Seasonal Vaccine 12 years and older 11/24/2022   Zoster Recombinant(Shingrix ) 08/20/2021, 11/25/2021    Screening Tests Health Maintenance  Topic Date Due   COVID-19 Vaccine (7 - Pfizer risk 2024-25 season) 05/24/2023   INFLUENZA VACCINE  10/22/2023   Medicare Annual Wellness (AWV)  10/05/2024   MAMMOGRAM  11/16/2024   DTaP/Tdap/Td (2 - Tdap) 03/09/2027   Colonoscopy  07/27/2027   Pneumococcal Vaccine: 50+ Years  Completed   DEXA SCAN  Completed   Hepatitis C Screening  Completed   Zoster Vaccines- Shingrix    Completed   Hepatitis B Vaccines  Aged Out   HPV VACCINES  Aged Out   Meningococcal B Vaccine  Aged Out    Health Maintenance  Health Maintenance Due  Topic Date Due   COVID-19 Vaccine (7 - Pfizer risk 2024-25 season) 05/24/2023   Health Maintenance Items Addressed: Up to date  Additional Screening:  Vision Screening: Recommended annual ophthalmology exams for early detection of glaucoma and other disorders of the eye. Would you like a referral to an eye doctor? No    Dental Screening: Recommended annual dental exams for proper oral hygiene  Community Resource Referral / Chronic Care Management: CRR required this visit?  No   CCM required this visit?  No   Plan:    I have personally reviewed and noted the following in the patient's chart:   Medical and social history Use of alcohol, tobacco or illicit drugs  Current medications and supplements including opioid prescriptions. Patient is not currently taking opioid prescriptions. Functional ability and status Nutritional status Physical activity Advanced directives List of other physicians Hospitalizations, surgeries, and ER visits in previous 12 months Vitals Screenings to include cognitive, depression, and falls Referrals  and appointments  In addition, I have reviewed and discussed with patient certain preventive protocols, quality metrics, and best practice recommendations. A written personalized care plan for preventive services as well as general preventive health recommendations were provided to patient.   Ardella FORBES Dawn, LPN   2/83/7974   After Visit Summary: (MyChart) Due to this being a telephonic visit, the after visit summary with patients personalized plan was offered to patient via MyChart   Notes: Nothing significant to report at this time.

## 2023-10-16 ENCOUNTER — Other Ambulatory Visit: Payer: Self-pay | Admitting: Nurse Practitioner

## 2023-11-19 ENCOUNTER — Ambulatory Visit
Admission: RE | Admit: 2023-11-19 | Discharge: 2023-11-19 | Disposition: A | Discharge status: Home / Self Care | Source: Ambulatory Visit | Attending: Internal Medicine | Admitting: Internal Medicine

## 2023-11-19 DIAGNOSIS — Z1231 Encounter for screening mammogram for malignant neoplasm of breast: Secondary | ICD-10-CM

## 2023-11-25 ENCOUNTER — Other Ambulatory Visit: Payer: Medicare Other

## 2023-11-25 ENCOUNTER — Ambulatory Visit (HOSPITAL_BASED_OUTPATIENT_CLINIC_OR_DEPARTMENT_OTHER)
Admission: RE | Admit: 2023-11-25 | Discharge: 2023-11-25 | Disposition: A | Discharge status: Home / Self Care | Source: Ambulatory Visit | Attending: Internal Medicine | Admitting: Internal Medicine

## 2023-11-25 DIAGNOSIS — E2839 Other primary ovarian failure: Secondary | ICD-10-CM | POA: Insufficient documentation

## 2023-11-27 ENCOUNTER — Ambulatory Visit: Payer: Self-pay | Admitting: Internal Medicine

## 2023-12-01 ENCOUNTER — Other Ambulatory Visit: Payer: Self-pay

## 2023-12-01 MED ORDER — COVID-19 MRNA VAC-TRIS(PFIZER) 30 MCG/0.3ML IM SUSY: 0.3000 mL | PREFILLED_SYRINGE | Freq: Once | INTRAMUSCULAR | 0 refills | Status: AC

## 2024-01-31 ENCOUNTER — Ambulatory Visit: Payer: Self-pay

## 2024-01-31 NOTE — Telephone Encounter (Signed)
 FYI Only or Action Required?: FYI only for provider: appointment scheduled on 11/12.  Patient was last seen in primary care on 09/22/2023 by [name], MD.  Called Nurse Triage reporting Cough.  Symptoms began several weeks ago.  Interventions attempted: OTC medications: mucinex and Rest, hydration, or home remedies.  Symptoms are: gradually worsening.  Triage Disposition: See Physician Within 24 Hours  Patient/caregiver understands and will follow disposition?: Yes  Copied from CRM [id number]. Topic: Clinical - Red Word Triage >> Jan 31, 2024  1:03 PM Wess RAMAN wrote: Red Word that prompted transfer to Nurse Triage: Runny nose, cough, yellow Phlegm for 3 weeks Reason for Disposition  [1] Continuous (nonstop) coughing interferes with work or school AND [2] no improvement using cough treatment per Care Advice  Answer Assessment - Initial Assessment Questions Has had cough for 3+ weeks now. Had it and went on a 7 days cruise and it didn't get better. Has been taking mucinex, nasal saline spray, cough drops, peppermints, and sleeping with humidifier on. Hard to talk without coughing fit.  Appt weds 11/12 at 0900 and waitlist to see if any openings for 11/11. ED/UC recommendations given and understood.   1. ONSET: When did the cough begin?      At lest 3 weeks  2. SEVERITY: How bad is the cough today?      Barely able to hold conversation without coughing 3. SPUTUM: Describe the color of your sputum (e.g., none, dry cough; clear, white, yellow, green)     Yellow thick  4. HEMOPTYSIS: Are you coughing up any blood? If Yes, ask: How much? (e.g., flecks, streaks, tablespoons, etc.)     Denies 5. DIFFICULTY BREATHING: Are you having difficulty breathing? If Yes, ask: How bad is it? (e.g., mild, moderate, severe)      Mild SOB- sleeping sitting up - hard to catch her breath with coughin 6. FEVER: Do you have a fever? If Yes, ask: What is your temperature, how was it  measured, and when did it start?     Denies  7. CARDIAC HISTORY: Do you have any history of heart disease? (e.g., heart attack, congestive heart failure)      HTN  8. LUNG HISTORY: Do you have any history of lung disease?  (e.g., pulmonary embolus, asthma, emphysema)     Denies  9. PE RISK FACTORS: Do you have a history of blood clots? (or: recent major surgery, recent prolonged travel, bedridden)     denies 12. TRAVEL: Have you traveled out of the country in the last month? (e.g., travel history, exposures)       Recent cruise to [place]  Protocols used: Cough - Acute Productive-A-AH

## 2024-02-02 ENCOUNTER — Encounter: Payer: Self-pay | Admitting: Family Medicine

## 2024-02-02 ENCOUNTER — Ambulatory Visit: Payer: Self-pay | Admitting: Family Medicine

## 2024-02-02 VITALS — BP 110/70 | HR 78 | Temp 97.9°F | Wt 185.0 lb

## 2024-02-02 DIAGNOSIS — J069 Acute upper respiratory infection, unspecified: Secondary | ICD-10-CM | POA: Diagnosis not present

## 2024-02-02 MED ORDER — TRIAMCINOLONE ACETONIDE 40 MG/ML IJ SUSP
60.0000 mg | Freq: Once | INTRAMUSCULAR | Status: AC
  Administered 2024-02-02: 60 mg via INTRAMUSCULAR

## 2024-02-02 MED ORDER — AZITHROMYCIN 250 MG PO TABS: ORAL_TABLET | ORAL | 0 refills | Status: AC

## 2024-02-02 MED ORDER — BENZONATATE 200 MG PO CAPS: 200.0000 mg | ORAL_CAPSULE | Freq: Three times a day (TID) | ORAL | 0 refills | Status: AC | PRN

## 2024-02-02 NOTE — Assessment & Plan Note (Signed)
 Acute upper respiratory infection with productive cough and nasal congestion for three weeks. Possible bacterial infection indicated by yellow sputum. No fever. Fatigue due to cough. Allergic to sulfur drugs, azithromycin  chosen. - Administered azithromycin  (Z-Pak) with dosing of two tablets on the first day, followed by one tablet daily for the next four days. - Administered a steroid shot for immediate symptom relief. - Prescribed Tessalon  pearls for cough, to be used as needed.

## 2024-02-02 NOTE — Progress Notes (Signed)
 I,Jameka J Llittleton, CMA,acting as a neurosurgeon for [employer], NP.,have documented all relevant documentation on the behalf of Bruna Creighton, NP,as directed by  Bruna Creighton, NP while in the presence of Bruna Creighton, NP.  Subjective:  Patient ID: Stephanie Dudley , female    DOB: 1955-09-03 , 68 y.o.   MRN: [id number]  Chief Complaint  Patient presents with   URI    Patient presents today for cold symptoms. She has been having a productive cough and runny nose. Her symptoms have been going on for 3 weeks now.     HPI Discussed the use of AI scribe software for clinical note transcription with the patient, who gave verbal consent to proceed.  History of Present Illness   Stephanie Dudley is a 68 year old female who presents with a persistent cough and runny nose.  She has been experiencing a persistent cough and runny nose for the past three weeks, which began after receiving a flu shot approximately four weeks ago. The cough is productive with yellow nasal discharge. There is constant nasal dripping and frequent need to blow her nose, but no sinus pressure or pain.  During a seven-day cruise, her symptoms worsened, with increased coughing and nasal discharge, accompanied by a feeling of weakness. Upon returning, she began taking Mucinex and using saline for nasal irrigation, but these treatments did not alleviate her symptoms. She has since stopped taking Mucinex as it was ineffective.  She denies recent COVID-19 testing, although she did test before her cruise last month. Initially, she experienced rib pain due to excessive coughing, which has since resolved. She has not had a fever during the past three weeks. The cough disrupts her sleep at night, leading to fatigue and lack of energy.      Past Medical History:  Diagnosis Date   Allergy    Sulfur   Arthritis    Breast mass, right 08/2017   Benign tissue   Complication of anesthesia    hard to wake up with thyroid  surgery    Family history of breast cancer    Family history of thyroid  cancer    Family history of uterine cancer    GERD (gastroesophageal reflux disease)    Hyperlipidemia    Hypertension    Hypothyroidism    Mitral valve prolapse    a. mild by echo 04/2018.// Echocardiogram 12/21: EF 60-65, no RWMA, mild LVH, GR 1 DD, normal RVSF, mild LAE, myxomatous MV, mild MR, mild MVP, no pericardial effusion    Obesity    Pericardial effusion    Pericarditis    Peripheral edema    ankles, feet     Family History  Problem Relation Age of Onset   Breast cancer Sister 21   Thyroid  cancer Mother    Hypertension Father    Hyperlipidemia Father    Heart Problems Father    Cancer Father    Uterine cancer Paternal Grandmother 21     Current Outpatient Medications:    azithromycin  (ZITHROMAX ) 250 MG tablet, Take 2 tablets (500 mg) on  Day 1,  followed by 1 tablet (250 mg) once daily on Days 2 through 5., Disp: 6 each, Rfl: 0   benzonatate  (TESSALON ) 200 MG capsule, Take 1 capsule (200 mg total) by mouth 3 (three) times daily as needed for cough., Disp: 30 capsule, Rfl: 0   carvedilol  (COREG ) 12.5 MG tablet, TAKE 1 TABLET BY MOUTH 2 TIMES A DAY, Disp: 180 tablet, Rfl: 3  escitalopram  (LEXAPRO ) 10 MG tablet, Take 1 tablet (10 mg total) by mouth daily., Disp: 90 tablet, Rfl: 1   ezetimibe  (ZETIA ) 10 MG tablet, Take 10 mg by mouth daily., Disp: , Rfl:    famotidine  (PEPCID ) 20 MG tablet, Take 1 tablet (20 mg total) by mouth daily., Disp: 90 tablet, Rfl: 2   furosemide  (LASIX ) 40 MG tablet, Take 1 tablet (40 mg total) by mouth daily., Disp: 90 tablet, Rfl: 2   rosuvastatin  (CRESTOR ) 20 MG tablet, Take 1 tablet (20 mg total) by mouth daily., Disp: 90 tablet, Rfl: 1   spironolactone  (ALDACTONE ) 25 MG tablet, Take 1 tablet (25 mg total) by mouth daily., Disp: 90 tablet, Rfl: 1   SYNTHROID  88 MCG tablet, TAKE 1 TABLET BY MOUTH DAILY, Disp: 90 tablet, Rfl: 1   valsartan  (DIOVAN ) 160 MG tablet, Take 1 tablet (160  mg total) by mouth daily., Disp: 90 tablet, Rfl: 1   Allergies  Allergen Reactions   Sulfa Antibiotics Swelling     Review of Systems  Constitutional:  Positive for fatigue. Negative for chills and fever.  HENT: Negative.    Respiratory:  Positive for cough.   Cardiovascular:  Positive for chest pain.  Neurological: Negative.   Psychiatric/Behavioral: Negative.       Today's Vitals   02/02/24 0910  BP: 110/70  Pulse: 78  Temp: 97.9 F (36.6 C)  TempSrc: Oral  Weight: 185 lb (83.9 kg)  PainSc: 0-No pain   Body mass index is 29.86 kg/m.  Wt Readings from Last 3 Encounters:  02/02/24 185 lb (83.9 kg)  09/22/23 185 lb 3.2 oz (84 kg)  08/30/23 182 lb 6.4 oz (82.7 kg)    The 10-year ASCVD risk score (Arnett DK, et al., 2019) is: 6.6%   Values used to calculate the score:     Age: 89 years     Clincally relevant sex: Female     Is Non-Hispanic African American: Yes     Diabetic: No     Tobacco smoker: No     Systolic Blood Pressure: 110 mmHg     Is BP treated: Yes     HDL Cholesterol: 50 mg/dL     Total Cholesterol: 189 mg/dL  Objective:  Physical Exam HENT:     Head: Normocephalic. No abrasion, contusion or masses.     Jaw: There is normal jaw occlusion. No trismus.  Cardiovascular:     Rate and Rhythm: Normal rate and regular rhythm.  Pulmonary:     Effort: Pulmonary effort is normal.     Breath sounds: Normal breath sounds.  Abdominal:     General: Bowel sounds are normal.  Neurological:     Mental Status: She is alert and oriented to person, place, and time. Mental status is at baseline.         Assessment And Plan:   Assessment & Plan URI with cough and congestion Acute upper respiratory infection with productive cough and nasal congestion for three weeks. Possible bacterial infection indicated by yellow sputum. No fever. Fatigue due to cough. Allergic to sulfur drugs, azithromycin  chosen. - Administered azithromycin  (Z-Pak) with dosing of two tablets  on the first day, followed by one tablet daily for the next four days. - Administered a steroid shot for immediate symptom relief. - Prescribed Tessalon  pearls for cough, to be used as needed.      Return if symptoms worsen or fail to improve, for keep scheduled appt.  Patient was given opportunity to ask questions.  Patient verbalized understanding of the plan and was able to repeat key elements of the plan. All questions were answered to their satisfaction.    I, Bruna Creighton, NP, have reviewed all documentation for this visit. The documentation on 02/02/2024 for the exam, diagnosis, procedures, and orders are all accurate and complete.   IF YOU HAVE BEEN REFERRED TO A SPECIALIST, IT MAY TAKE 1-2 WEEKS TO SCHEDULE/PROCESS THE REFERRAL. IF YOU HAVE NOT HEARD FROM US /SPECIALIST IN TWO WEEKS, PLEASE GIVE US  A CALL AT [phone].

## 2024-03-29 ENCOUNTER — Other Ambulatory Visit: Payer: Self-pay

## 2024-03-29 MED ORDER — EZETIMIBE 10 MG PO TABS: 10.0000 mg | ORAL_TABLET | Freq: Every day | ORAL | 1 refills | Status: AC

## 2024-04-03 ENCOUNTER — Ambulatory Visit: Payer: Self-pay | Admitting: Internal Medicine

## 2024-04-03 ENCOUNTER — Encounter: Payer: Self-pay | Admitting: Internal Medicine

## 2024-04-03 VITALS — BP 118/80 | HR 64 | Temp 98.0°F | Ht 66.0 in | Wt 180.4 lb

## 2024-04-03 DIAGNOSIS — E7800 Pure hypercholesterolemia, unspecified: Secondary | ICD-10-CM

## 2024-04-03 DIAGNOSIS — E663 Overweight: Secondary | ICD-10-CM

## 2024-04-03 DIAGNOSIS — E2839 Other primary ovarian failure: Secondary | ICD-10-CM

## 2024-04-03 DIAGNOSIS — E890 Postprocedural hypothyroidism: Secondary | ICD-10-CM

## 2024-04-03 DIAGNOSIS — Z6829 Body mass index (BMI) 29.0-29.9, adult: Secondary | ICD-10-CM

## 2024-04-03 DIAGNOSIS — I2510 Atherosclerotic heart disease of native coronary artery without angina pectoris: Secondary | ICD-10-CM

## 2024-04-03 DIAGNOSIS — I119 Hypertensive heart disease without heart failure: Secondary | ICD-10-CM | POA: Diagnosis not present

## 2024-04-03 DIAGNOSIS — I2584 Coronary atherosclerosis due to calcified coronary lesion: Secondary | ICD-10-CM | POA: Diagnosis not present

## 2024-04-03 LAB — CMP14+EGFR
ALT: 32 IU/L (ref 0–32)
AST: 29 IU/L (ref 0–40)
Albumin: 4.5 g/dL (ref 3.9–4.9)
Alkaline Phosphatase: 72 IU/L (ref 49–135)
BUN/Creatinine Ratio: 13 (ref 12–28)
BUN: 9 mg/dL (ref 8–27)
Bilirubin Total: 0.9 mg/dL (ref 0.0–1.2)
CO2: 23 mmol/L (ref 20–29)
Calcium: 9.6 mg/dL (ref 8.7–10.3)
Chloride: 103 mmol/L (ref 96–106)
Creatinine, Ser: 0.72 mg/dL (ref 0.57–1.00)
Globulin, Total: 2.1 g/dL (ref 1.5–4.5)
Glucose: 99 mg/dL (ref 70–99)
Potassium: 4.1 mmol/L (ref 3.5–5.2)
Sodium: 141 mmol/L (ref 134–144)
Total Protein: 6.6 g/dL (ref 6.0–8.5)
eGFR: 92 mL/min/1.73

## 2024-04-03 LAB — LIPID PANEL
Chol/HDL Ratio: 2.2 ratio (ref 0.0–4.4)
Cholesterol, Total: 188 mg/dL (ref 100–199)
HDL: 84 mg/dL
LDL Chol Calc (NIH): 86 mg/dL (ref 0–99)
Triglycerides: 105 mg/dL (ref 0–149)
VLDL Cholesterol Cal: 18 mg/dL (ref 5–40)

## 2024-04-03 LAB — TSH+FREE T4
Free T4: 1.4 ng/dL (ref 0.82–1.77)
TSH: 1.57 u[IU]/mL (ref 0.450–4.500)

## 2024-04-03 MED ORDER — FAMOTIDINE 20 MG PO TABS: 20.0000 mg | ORAL_TABLET | Freq: Every day | ORAL | 2 refills | Status: AC

## 2024-04-03 MED ORDER — VALSARTAN 160 MG PO TABS: 160.0000 mg | ORAL_TABLET | Freq: Every day | ORAL | 2 refills | Status: DC

## 2024-04-03 MED ORDER — SPIRONOLACTONE 25 MG PO TABS: 25.0000 mg | ORAL_TABLET | Freq: Every day | ORAL | 2 refills | Status: DC

## 2024-04-03 MED ORDER — FUROSEMIDE 40 MG PO TABS: 40.0000 mg | ORAL_TABLET | Freq: Every day | ORAL | 2 refills | Status: AC

## 2024-04-03 NOTE — Assessment & Plan Note (Signed)
 Chronic, fair control. Goal BP<130/80. She will continue with carvedilol  12.5mg  twice daily, furosemide  40mg , spironolactone  25mg  and valsartan  160mg  daily. She is encouraged to follow low sodium diet.  - She will f/u in six months for re-evaluation.

## 2024-04-03 NOTE — Patient Instructions (Signed)
 Hypertension, Adult Hypertension is another name for high blood pressure. High blood pressure forces your heart to work harder to pump blood. This can cause problems over time. There are two numbers in a blood pressure reading. There is a top number (systolic) over a bottom number (diastolic). It is best to have a blood pressure that is below 120/80. What are the causes? The cause of this condition is not known. Some other conditions can lead to high blood pressure. What increases the risk? Some lifestyle factors can make you more likely to develop high blood pressure: Smoking. Not getting enough exercise or physical activity. Being overweight. Having too much fat, sugar, calories, or salt (sodium) in your diet. Drinking too much alcohol. Other risk factors include: Having any of these conditions: Heart disease. Diabetes. High cholesterol. Kidney disease. Obstructive sleep apnea. Having a family history of high blood pressure and high cholesterol. Age. The risk increases with age. Stress. What are the signs or symptoms? High blood pressure may not cause symptoms. Very high blood pressure (hypertensive crisis) may cause: Headache. Fast or uneven heartbeats (palpitations). Shortness of breath. Nosebleed. Vomiting or feeling like you may vomit (nauseous). Changes in how you see. Very bad chest pain. Feeling dizzy. Seizures. How is this treated? This condition is treated by making healthy lifestyle changes, such as: Eating healthy foods. Exercising more. Drinking less alcohol. Your doctor may prescribe medicine if lifestyle changes do not help enough and if: Your top number is above 130. Your bottom number is above 80. Your personal target blood pressure may vary. Follow these instructions at home: Eating and drinking  If told, follow the DASH eating plan. To follow this plan: Fill one half of your plate at each meal with fruits and vegetables. Fill one fourth of your plate  at each meal with whole grains. Whole grains include whole-wheat pasta, brown rice, and whole-grain bread. Eat or drink low-fat dairy products, such as skim milk or low-fat yogurt. Fill one fourth of your plate at each meal with low-fat (lean) proteins. Low-fat proteins include fish, chicken without skin, eggs, beans, and tofu. Avoid fatty meat, cured and processed meat, or chicken with skin. Avoid pre-made or processed food. Limit the amount of salt in your diet to less than 1,500 mg each day. Do not drink alcohol if: Your doctor tells you not to drink. You are pregnant, may be pregnant, or are planning to become pregnant. If you drink alcohol: Limit how much you have to: 0-1 drink a day for women. 0-2 drinks a day for men. Know how much alcohol is in your drink. In the [place] one drink equals one 12 oz bottle of beer (355 mL), one 5 oz glass of wine (148 mL), or one 1 oz glass of hard liquor (44 mL). Lifestyle  Work with your doctor to stay at a healthy weight or to lose weight. Ask your doctor what the best weight is for you. Get at least 30 minutes of exercise that causes your heart to beat faster (aerobic exercise) most days of the week. This may include walking, swimming, or biking. Get at least 30 minutes of exercise that strengthens your muscles (resistance exercise) at least 3 days a week. This may include lifting weights or doing Pilates. Do not smoke or use any products that contain nicotine or tobacco. If you need help quitting, ask your doctor. Check your blood pressure at home as told by your doctor. Keep all follow-up visits. Medicines Take over-the-counter and prescription medicines  only as told by your doctor. Follow directions carefully. Do not skip doses of blood pressure medicine. The medicine does not work as well if you skip doses. Skipping doses also puts you at risk for problems. Ask your doctor about side effects or reactions to medicines that you should watch  for. Contact a doctor if: You think you are having a reaction to the medicine you are taking. You have headaches that keep coming back. You feel dizzy. You have swelling in your ankles. You have trouble with your vision. Get help right away if: You get a very bad headache. You start to feel mixed up (confused). You feel weak or numb. You feel faint. You have very bad pain in your: Chest. Belly (abdomen). You vomit more than once. You have trouble breathing. These symptoms may be an emergency. Get help right away. Call 911. Do not wait to see if the symptoms will go away. Do not drive yourself to the hospital. Summary Hypertension is another name for high blood pressure. High blood pressure forces your heart to work harder to pump blood. For most people, a normal blood pressure is less than 120/80. Making healthy choices can help lower blood pressure. If your blood pressure does not get lower with healthy choices, you may need to take medicine. This information is not intended to replace advice given to you by your health care provider. Make sure you discuss any questions you have with your health care provider. Document Revised: 12/26/2020 Document Reviewed: 12/26/2020 Elsevier Patient Education  2024 [employer].

## 2024-04-03 NOTE — Progress Notes (Signed)
 I,Victoria T Emmitt, CMA,acting as a neurosurgeon for Catheryn LOISE Slocumb, MD.,have documented all relevant documentation on the behalf of Catheryn LOISE Slocumb, MD,as directed by  Catheryn LOISE Slocumb, MD while in the presence of Catheryn LOISE Slocumb, MD.  Subjective:  Patient ID: Stephanie Dudley , female    DOB: 03-30-1955 , 69 y.o.   MRN: [id number]  Chief Complaint  Patient presents with   Hypertension    Patient presents today for bp, chol & thyroid  follow up. She reports compliance with medications denies headache, chest pain & sob. She has no specific questions or concerns.    Hyperlipidemia   Hypothyroidism    HPI Discussed the use of AI scribe software for clinical note transcription with the patient, who gave verbal consent to proceed.  History of Present Illness Stephanie Dudley is a 69 year old female with hypertension who presents for a blood pressure check.  She reports no new updates from her cardiologist and states that everything is fine. She last saw her cardiologist in June and now has annual visits.  Her current medication regimen includes carvedilol , Lexapro , Zetia , Lasix  40 mg daily, rosuvastatin  20 mg, spironolactone , levothyroxine  88 mcg (generic), and valsartan . She mentions having five bottles at home that need refills. She also takes Pepcid  as needed, sometimes up to three times a week, particularly after consuming foods like spaghetti or fried chicken.  She is actively involved in household activities and yard work. She also takes care of her grandchildren in the afternoons. Her daughter, Otis Blush, works in administrator.   Hypertension This is a chronic problem. The current episode started more than 1 year ago. The problem has been gradually improving since onset. The problem is uncontrolled. Pertinent negatives include no blurred vision. Risk factors for coronary artery disease include obesity, sedentary lifestyle and post-menopausal state. Past treatments include beta  blockers. The current treatment provides moderate improvement. Compliance problems include exercise.      Past Medical History:  Diagnosis Date   Allergy    Sulfur   Arthritis    Breast mass, right 08/2017   Benign tissue   Complication of anesthesia    hard to wake up with thyroid  surgery   Family history of breast cancer    Family history of thyroid  cancer    Family history of uterine cancer    GERD (gastroesophageal reflux disease)    Hyperlipidemia    Hypertension    Hypothyroidism    Mitral valve prolapse    a. mild by echo 04/2018.// Echocardiogram 12/21: EF 60-65, no RWMA, mild LVH, GR 1 DD, normal RVSF, mild LAE, myxomatous MV, mild MR, mild MVP, no pericardial effusion    Obesity    Pericardial effusion    Pericarditis    Peripheral edema    ankles, feet     Family History  Problem Relation Age of Onset   Breast cancer Sister 106   Thyroid  cancer Mother    Hypertension Father    Hyperlipidemia Father    Heart Problems Father    Cancer Father    Uterine cancer Paternal Grandmother 85     Current Outpatient Medications:    benzonatate  (TESSALON ) 200 MG capsule, Take 1 capsule (200 mg total) by mouth 3 (three) times daily as needed for cough., Disp: 30 capsule, Rfl: 0   carvedilol  (COREG ) 12.5 MG tablet, TAKE 1 TABLET BY MOUTH 2 TIMES A DAY, Disp: 180 tablet, Rfl: 3   escitalopram  (LEXAPRO ) 10 MG tablet, Take 1  tablet (10 mg total) by mouth daily., Disp: 90 tablet, Rfl: 1   ezetimibe  (ZETIA ) 10 MG tablet, Take 1 tablet (10 mg total) by mouth daily., Disp: 90 tablet, Rfl: 1   rosuvastatin  (CRESTOR ) 20 MG tablet, Take 1 tablet (20 mg total) by mouth daily., Disp: 90 tablet, Rfl: 1   SYNTHROID  88 MCG tablet, TAKE 1 TABLET BY MOUTH DAILY, Disp: 90 tablet, Rfl: 1   famotidine  (PEPCID ) 20 MG tablet, Take 1 tablet (20 mg total) by mouth daily., Disp: 90 tablet, Rfl: 2   furosemide  (LASIX ) 40 MG tablet, Take 1 tablet (40 mg total) by mouth daily., Disp: 90 tablet, Rfl: 2    spironolactone  (ALDACTONE ) 25 MG tablet, Take 1 tablet (25 mg total) by mouth daily., Disp: 90 tablet, Rfl: 2   valsartan  (DIOVAN ) 160 MG tablet, Take 1 tablet (160 mg total) by mouth daily., Disp: 90 tablet, Rfl: 2   Allergies  Allergen Reactions   Sulfa Antibiotics Swelling     Review of Systems  Constitutional: Negative.   Eyes:  Negative for blurred vision.  Respiratory: Negative.    Cardiovascular: Negative.   Neurological: Negative.   Psychiatric/Behavioral: Negative.       Today's Vitals   04/03/24 0933  BP: 118/80  Pulse: 64  Temp: 98 F (36.7 C)  SpO2: 98%  Weight: 180 lb 6.4 oz (81.8 kg)  Height: 5' 6 (1.676 m)   Body mass index is 29.12 kg/m.  Wt Readings from Last 3 Encounters:  04/03/24 180 lb 6.4 oz (81.8 kg)  02/02/24 185 lb (83.9 kg)  09/22/23 185 lb 3.2 oz (84 kg)    The 10-year ASCVD risk score (Arnett DK, et al., 2019) is: 8.2%   Values used to calculate the score:     Age: 61 years     Clinically relevant sex: Female     Is Non-Hispanic African American: Yes     Diabetic: No     Tobacco smoker: No     Systolic Blood Pressure: 118 mmHg     Is BP treated: Yes     HDL Cholesterol: 50 mg/dL     Total Cholesterol: 189 mg/dL  Objective:  Physical Exam Vitals and nursing note reviewed.  Constitutional:      Appearance: Normal appearance.  HENT:     Head: Normocephalic and atraumatic.  Eyes:     Extraocular Movements: Extraocular movements intact.  Cardiovascular:     Rate and Rhythm: Normal rate and regular rhythm.     Heart sounds: Normal heart sounds.  Pulmonary:     Effort: Pulmonary effort is normal.     Breath sounds: Normal breath sounds.  Musculoskeletal:     Cervical back: Normal range of motion.  Skin:    General: Skin is warm.  Neurological:     General: No focal deficit present.     Mental Status: She is alert.  Psychiatric:        Mood and Affect: Mood normal.        Behavior: Behavior normal.         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 Goal BP<130/80. She will continue with carvedilol  12.5mg  twice daily, furosemide  40mg , spironolactone  25mg  and valsartan  160mg  daily. She is encouraged to follow low sodium diet.  - She will f/u in six months for re-evaluation.   Coronary artery calcification of native artery Chronic, she is currently on carvedilol  and rosuvastatin  20mg   daily. Encouraged to follow heart healthy lifestyle. Cardiology input is appreciated.  Pure hypercholesterolemia Chronic, LDL goal is less than 70 due to underlying mild CAD. Cholesterol levels need evaluation.  Continue with rosuvastatin  20mg  daily and ezetimibe  10mg  daily.  - Order cholesterol panel. - Consider restarting Zetia  if LDL is not below target. Postoperative hypothyroidism Chronic, she is currently taking Synthroid  88mcg daily. I will check thyroid  panel and adjust meds as needed.  Overweight with body mass index (BMI) of 29 to 29.9 in adult Her BMI is acceptable for her demographic. Encouraged to aim for at least 150 minutes of exercise per week.    Orders Placed This Encounter  Procedures   CMP14+EGFR   Lipid panel   TSH + free T4     Return if symptoms worsen or fail to improve.  Patient was given opportunity to ask questions. Patient verbalized understanding of the plan and was able to repeat key elements of the plan. All questions were answered to their satisfaction.    I, Catheryn LOISE Slocumb, MD, have reviewed all documentation for this visit. The documentation on 04/03/2024 for the exam, diagnosis, procedures, and orders are all accurate and complete.   IF YOU HAVE BEEN REFERRED TO A SPECIALIST, IT MAY TAKE 1-2 WEEKS TO SCHEDULE/PROCESS THE REFERRAL. IF YOU HAVE NOT HEARD FROM US /SPECIALIST IN TWO WEEKS, PLEASE GIVE US  A CALL AT [phone].

## 2024-04-03 NOTE — Assessment & Plan Note (Signed)
 Chronic, LDL goal is less than 70 due to underlying mild CAD. Cholesterol levels need evaluation.  Continue with rosuvastatin  20mg  daily and ezetimibe  10mg  daily.  - Order cholesterol panel. - Consider restarting Zetia  if LDL is not below target.

## 2024-04-03 NOTE — Assessment & Plan Note (Signed)
Her BMI is acceptable for her demographic. Encouraged to aim for at least 150 minutes of exercise per week

## 2024-04-03 NOTE — Assessment & Plan Note (Signed)
 Chronic, she is currently on carvedilol and rosuvastatin 20mg  daily. Encouraged to follow heart healthy lifestyle. Cardiology input is appreciated.

## 2024-04-03 NOTE — Assessment & Plan Note (Signed)
 Chronic, she is currently taking Synthroid daily.  I will check thyroid panel and adjust meds as needed.

## 2024-04-04 ENCOUNTER — Ambulatory Visit: Payer: Self-pay | Admitting: Internal Medicine

## 2024-04-05 ENCOUNTER — Other Ambulatory Visit: Payer: Self-pay | Admitting: Internal Medicine

## 2024-09-27 ENCOUNTER — Encounter: Payer: Self-pay | Admitting: Internal Medicine

## 2024-11-15 ENCOUNTER — Ambulatory Visit: Payer: Self-pay
